# Patient Record
Sex: Female | Born: 1935 | Race: Black or African American | Hispanic: No | Smoking: Former smoker
Health system: Southern US, Community
[De-identification: ages and names within clinical notes are randomized; demographics above are authoritative.]

## PROBLEM LIST (undated history)

## (undated) DIAGNOSIS — I313 Pericardial effusion (noninflammatory): Secondary | ICD-10-CM

## (undated) DIAGNOSIS — T7840XA Allergy, unspecified, initial encounter: Secondary | ICD-10-CM

## (undated) DIAGNOSIS — C50919 Malignant neoplasm of unspecified site of unspecified female breast: Secondary | ICD-10-CM

## (undated) DIAGNOSIS — M8580 Other specified disorders of bone density and structure, unspecified site: Secondary | ICD-10-CM

## (undated) DIAGNOSIS — E785 Hyperlipidemia, unspecified: Secondary | ICD-10-CM

## (undated) DIAGNOSIS — F329 Major depressive disorder, single episode, unspecified: Secondary | ICD-10-CM

## (undated) DIAGNOSIS — F32A Depression, unspecified: Secondary | ICD-10-CM

## (undated) DIAGNOSIS — B070 Plantar wart: Secondary | ICD-10-CM

## (undated) DIAGNOSIS — S93409A Sprain of unspecified ligament of unspecified ankle, initial encounter: Secondary | ICD-10-CM

## (undated) DIAGNOSIS — I309 Acute pericarditis, unspecified: Secondary | ICD-10-CM

## (undated) DIAGNOSIS — I10 Essential (primary) hypertension: Secondary | ICD-10-CM

## (undated) DIAGNOSIS — I3139 Other pericardial effusion (noninflammatory): Secondary | ICD-10-CM

## (undated) DIAGNOSIS — B019 Varicella without complication: Secondary | ICD-10-CM

## (undated) DIAGNOSIS — F0390 Unspecified dementia without behavioral disturbance: Secondary | ICD-10-CM

## (undated) DIAGNOSIS — F419 Anxiety disorder, unspecified: Secondary | ICD-10-CM

## (undated) DIAGNOSIS — I319 Disease of pericardium, unspecified: Secondary | ICD-10-CM

## (undated) DIAGNOSIS — Z87891 Personal history of nicotine dependence: Secondary | ICD-10-CM

## (undated) DIAGNOSIS — F03A0 Unspecified dementia, mild, without behavioral disturbance, psychotic disturbance, mood disturbance, and anxiety: Secondary | ICD-10-CM

## (undated) HISTORY — DX: Unspecified dementia, mild, without behavioral disturbance, psychotic disturbance, mood disturbance, and anxiety: F03.A0

## (undated) HISTORY — DX: Other specified disorders of bone density and structure, unspecified site: M85.80

## (undated) HISTORY — DX: Anxiety disorder, unspecified: F41.9

## (undated) HISTORY — DX: Major depressive disorder, single episode, unspecified: F32.9

## (undated) HISTORY — DX: Sprain of unspecified ligament of unspecified ankle, initial encounter: S93.409A

## (undated) HISTORY — DX: Personal history of nicotine dependence: Z87.891

## (undated) HISTORY — DX: Hyperlipidemia, unspecified: E78.5

## (undated) HISTORY — DX: Allergy, unspecified, initial encounter: T78.40XA

## (undated) HISTORY — DX: Unspecified dementia without behavioral disturbance: F03.90

## (undated) HISTORY — PX: BREAST SURGERY: SHX581

## (undated) HISTORY — DX: Essential (primary) hypertension: I10

## (undated) HISTORY — PX: HAMMER TOE SURGERY: SHX385

## (undated) HISTORY — PX: BUNIONECTOMY: SHX129

## (undated) HISTORY — DX: Plantar wart: B07.0

## (undated) HISTORY — DX: Acute pericarditis, unspecified: I30.9

## (undated) HISTORY — DX: Depression, unspecified: F32.A

## (undated) HISTORY — DX: Varicella without complication: B01.9

## (undated) HISTORY — DX: Malignant neoplasm of unspecified site of unspecified female breast: C50.919

---

## 1998-10-27 ENCOUNTER — Encounter: Admission: RE | Admit: 1998-10-27 | Discharge: 1998-10-27 | Payer: Self-pay | Admitting: Internal Medicine

## 1999-02-04 ENCOUNTER — Encounter: Admission: RE | Admit: 1999-02-04 | Discharge: 1999-02-04 | Payer: Self-pay | Admitting: Internal Medicine

## 1999-04-14 ENCOUNTER — Encounter: Admission: RE | Admit: 1999-04-14 | Discharge: 1999-04-14 | Payer: Self-pay | Admitting: Internal Medicine

## 1999-04-14 ENCOUNTER — Ambulatory Visit (HOSPITAL_COMMUNITY): Admission: RE | Admit: 1999-04-14 | Discharge: 1999-04-14 | Payer: Self-pay | Admitting: Internal Medicine

## 1999-04-24 ENCOUNTER — Ambulatory Visit (HOSPITAL_COMMUNITY): Admission: RE | Admit: 1999-04-24 | Discharge: 1999-04-24 | Payer: Self-pay

## 1999-06-12 ENCOUNTER — Encounter: Admission: RE | Admit: 1999-06-12 | Discharge: 1999-06-12 | Payer: Self-pay | Admitting: Internal Medicine

## 1999-10-02 ENCOUNTER — Encounter: Admission: RE | Admit: 1999-10-02 | Discharge: 1999-10-02 | Payer: Self-pay | Admitting: Internal Medicine

## 1999-11-05 ENCOUNTER — Encounter: Admission: RE | Admit: 1999-11-05 | Discharge: 1999-11-05 | Payer: Self-pay | Admitting: Internal Medicine

## 2000-02-11 ENCOUNTER — Encounter: Admission: RE | Admit: 2000-02-11 | Discharge: 2000-02-11 | Payer: Self-pay | Admitting: Internal Medicine

## 2000-03-09 ENCOUNTER — Encounter: Admission: RE | Admit: 2000-03-09 | Discharge: 2000-03-09 | Payer: Self-pay | Admitting: Hematology and Oncology

## 2000-04-28 ENCOUNTER — Encounter: Admission: RE | Admit: 2000-04-28 | Discharge: 2000-04-28 | Payer: Self-pay | Admitting: Internal Medicine

## 2000-08-25 ENCOUNTER — Encounter: Admission: RE | Admit: 2000-08-25 | Discharge: 2000-08-25 | Payer: Self-pay | Admitting: Hematology and Oncology

## 2000-12-08 ENCOUNTER — Encounter: Admission: RE | Admit: 2000-12-08 | Discharge: 2000-12-08 | Payer: Self-pay | Admitting: Internal Medicine

## 2001-01-20 ENCOUNTER — Encounter: Admission: RE | Admit: 2001-01-20 | Discharge: 2001-01-20 | Payer: Self-pay | Admitting: Internal Medicine

## 2001-04-14 ENCOUNTER — Encounter: Admission: RE | Admit: 2001-04-14 | Discharge: 2001-04-14 | Payer: Self-pay

## 2001-06-08 ENCOUNTER — Encounter: Admission: RE | Admit: 2001-06-08 | Discharge: 2001-06-08 | Payer: Self-pay | Admitting: Internal Medicine

## 2001-06-11 ENCOUNTER — Emergency Department (HOSPITAL_COMMUNITY): Admission: EM | Admit: 2001-06-11 | Discharge: 2001-06-11 | Payer: Self-pay | Admitting: Emergency Medicine

## 2001-07-18 ENCOUNTER — Ambulatory Visit (HOSPITAL_COMMUNITY): Admission: RE | Admit: 2001-07-18 | Discharge: 2001-07-18 | Payer: Self-pay | Admitting: Internal Medicine

## 2001-09-11 ENCOUNTER — Encounter: Admission: RE | Admit: 2001-09-11 | Discharge: 2001-09-11 | Payer: Self-pay | Admitting: Internal Medicine

## 2001-09-12 ENCOUNTER — Encounter: Admission: RE | Admit: 2001-09-12 | Discharge: 2001-09-12 | Payer: Self-pay | Admitting: Internal Medicine

## 2001-10-20 ENCOUNTER — Encounter: Admission: RE | Admit: 2001-10-20 | Discharge: 2001-10-20 | Payer: Self-pay | Admitting: Internal Medicine

## 2001-12-04 ENCOUNTER — Encounter: Admission: RE | Admit: 2001-12-04 | Discharge: 2001-12-04 | Payer: Self-pay | Admitting: Internal Medicine

## 2002-01-15 ENCOUNTER — Encounter: Admission: RE | Admit: 2002-01-15 | Discharge: 2002-01-15 | Payer: Self-pay | Admitting: Internal Medicine

## 2002-07-09 ENCOUNTER — Encounter: Admission: RE | Admit: 2002-07-09 | Discharge: 2002-07-09 | Payer: Self-pay | Admitting: Internal Medicine

## 2002-10-10 ENCOUNTER — Encounter: Admission: RE | Admit: 2002-10-10 | Discharge: 2002-10-10 | Payer: Self-pay | Admitting: Internal Medicine

## 2003-03-04 ENCOUNTER — Encounter: Admission: RE | Admit: 2003-03-04 | Discharge: 2003-03-04 | Payer: Self-pay | Admitting: Internal Medicine

## 2003-03-04 ENCOUNTER — Emergency Department (HOSPITAL_COMMUNITY): Admission: EM | Admit: 2003-03-04 | Discharge: 2003-03-04 | Payer: Self-pay | Admitting: Emergency Medicine

## 2003-03-13 ENCOUNTER — Encounter: Admission: RE | Admit: 2003-03-13 | Discharge: 2003-03-13 | Payer: Self-pay | Admitting: Internal Medicine

## 2003-09-25 ENCOUNTER — Encounter: Admission: RE | Admit: 2003-09-25 | Discharge: 2003-09-25 | Payer: Self-pay | Admitting: Internal Medicine

## 2003-10-03 ENCOUNTER — Encounter: Admission: RE | Admit: 2003-10-03 | Discharge: 2003-10-03 | Payer: Self-pay | Admitting: Internal Medicine

## 2003-11-19 ENCOUNTER — Encounter: Admission: RE | Admit: 2003-11-19 | Discharge: 2003-11-19 | Payer: Self-pay | Admitting: Internal Medicine

## 2003-11-22 ENCOUNTER — Encounter: Admission: RE | Admit: 2003-11-22 | Discharge: 2003-11-22 | Payer: Self-pay | Admitting: Internal Medicine

## 2003-11-22 ENCOUNTER — Encounter (INDEPENDENT_AMBULATORY_CARE_PROVIDER_SITE_OTHER): Payer: Self-pay | Admitting: *Deleted

## 2003-11-27 DIAGNOSIS — C50919 Malignant neoplasm of unspecified site of unspecified female breast: Secondary | ICD-10-CM | POA: Insufficient documentation

## 2003-11-27 HISTORY — DX: Malignant neoplasm of unspecified site of unspecified female breast: C50.919

## 2003-12-02 ENCOUNTER — Encounter: Admission: RE | Admit: 2003-12-02 | Discharge: 2003-12-02 | Payer: Self-pay | Admitting: General Surgery

## 2003-12-04 ENCOUNTER — Ambulatory Visit (HOSPITAL_COMMUNITY): Admission: RE | Admit: 2003-12-04 | Discharge: 2003-12-04 | Payer: Self-pay | Admitting: General Surgery

## 2003-12-04 ENCOUNTER — Encounter (INDEPENDENT_AMBULATORY_CARE_PROVIDER_SITE_OTHER): Payer: Self-pay | Admitting: *Deleted

## 2003-12-04 ENCOUNTER — Ambulatory Visit (HOSPITAL_BASED_OUTPATIENT_CLINIC_OR_DEPARTMENT_OTHER): Admission: RE | Admit: 2003-12-04 | Discharge: 2003-12-04 | Payer: Self-pay | Admitting: General Surgery

## 2003-12-18 ENCOUNTER — Ambulatory Visit: Admission: RE | Admit: 2003-12-18 | Discharge: 2004-02-25 | Payer: Self-pay | Admitting: Radiation Oncology

## 2004-03-12 ENCOUNTER — Ambulatory Visit: Admission: RE | Admit: 2004-03-12 | Discharge: 2004-03-12 | Payer: Self-pay | Admitting: Radiation Oncology

## 2004-05-11 ENCOUNTER — Encounter: Admission: RE | Admit: 2004-05-11 | Discharge: 2004-05-11 | Payer: Self-pay | Admitting: Dentistry

## 2004-09-15 ENCOUNTER — Ambulatory Visit: Payer: Self-pay | Admitting: Internal Medicine

## 2004-09-17 ENCOUNTER — Ambulatory Visit: Payer: Self-pay | Admitting: Dentistry

## 2004-09-17 ENCOUNTER — Ambulatory Visit (HOSPITAL_COMMUNITY): Admission: RE | Admit: 2004-09-17 | Discharge: 2004-09-17 | Payer: Self-pay | Admitting: Dentistry

## 2004-09-17 ENCOUNTER — Encounter (INDEPENDENT_AMBULATORY_CARE_PROVIDER_SITE_OTHER): Payer: Self-pay | Admitting: Specialist

## 2004-09-24 ENCOUNTER — Ambulatory Visit: Payer: Self-pay | Admitting: Dentistry

## 2004-09-24 ENCOUNTER — Ambulatory Visit: Admission: RE | Admit: 2004-09-24 | Discharge: 2004-09-24 | Payer: Self-pay | Admitting: Radiation Oncology

## 2004-10-27 ENCOUNTER — Ambulatory Visit: Payer: Self-pay | Admitting: Dentistry

## 2004-11-01 ENCOUNTER — Ambulatory Visit: Payer: Self-pay | Admitting: Oncology

## 2004-11-11 ENCOUNTER — Ambulatory Visit: Payer: Self-pay | Admitting: Dentistry

## 2004-11-23 ENCOUNTER — Encounter: Admission: RE | Admit: 2004-11-23 | Discharge: 2004-11-23 | Payer: Self-pay | Admitting: General Surgery

## 2004-12-08 ENCOUNTER — Ambulatory Visit: Payer: Self-pay | Admitting: Dentistry

## 2004-12-30 ENCOUNTER — Ambulatory Visit: Payer: Self-pay | Admitting: Dentistry

## 2005-10-26 ENCOUNTER — Ambulatory Visit: Payer: Self-pay | Admitting: Oncology

## 2005-10-26 ENCOUNTER — Ambulatory Visit: Payer: Self-pay | Admitting: Internal Medicine

## 2005-11-29 ENCOUNTER — Encounter: Admission: RE | Admit: 2005-11-29 | Discharge: 2005-11-29 | Payer: Self-pay | Admitting: General Surgery

## 2006-01-14 ENCOUNTER — Ambulatory Visit: Payer: Self-pay | Admitting: Internal Medicine

## 2006-05-03 ENCOUNTER — Ambulatory Visit: Payer: Self-pay | Admitting: Internal Medicine

## 2006-08-02 ENCOUNTER — Ambulatory Visit: Payer: Self-pay | Admitting: Internal Medicine

## 2006-08-15 ENCOUNTER — Ambulatory Visit: Payer: Self-pay | Admitting: Hospitalist

## 2006-09-16 ENCOUNTER — Ambulatory Visit: Payer: Self-pay | Admitting: Hospitalist

## 2006-09-23 ENCOUNTER — Encounter: Admission: RE | Admit: 2006-09-23 | Discharge: 2006-09-23 | Payer: Self-pay | Admitting: General Surgery

## 2006-10-21 ENCOUNTER — Ambulatory Visit: Payer: Self-pay | Admitting: Oncology

## 2006-10-25 LAB — COMPREHENSIVE METABOLIC PANEL
ALT: 14 U/L (ref 0–40)
AST: 23 U/L (ref 0–37)
Alkaline Phosphatase: 134 U/L — ABNORMAL HIGH (ref 39–117)
Calcium: 9.1 mg/dL (ref 8.4–10.5)
Chloride: 108 mEq/L (ref 96–112)
Creatinine, Ser: 0.99 mg/dL (ref 0.40–1.20)
Potassium: 4.1 mEq/L (ref 3.5–5.3)

## 2006-10-25 LAB — CBC WITH DIFFERENTIAL/PLATELET
BASO%: 0.6 % (ref 0.0–2.0)
EOS%: 4.7 % (ref 0.0–7.0)
MCH: 33.8 pg (ref 26.0–34.0)
MCHC: 34.2 g/dL (ref 32.0–36.0)
NEUT%: 52.3 % (ref 39.6–76.8)
RDW: 12.8 % (ref 11.3–14.5)
lymph#: 1.6 10*3/uL (ref 0.9–3.3)

## 2006-11-08 DIAGNOSIS — Z87891 Personal history of nicotine dependence: Secondary | ICD-10-CM

## 2006-11-08 DIAGNOSIS — J310 Chronic rhinitis: Secondary | ICD-10-CM | POA: Insufficient documentation

## 2006-11-08 DIAGNOSIS — I10 Essential (primary) hypertension: Secondary | ICD-10-CM

## 2006-11-08 HISTORY — DX: Personal history of nicotine dependence: Z87.891

## 2007-01-11 DIAGNOSIS — E785 Hyperlipidemia, unspecified: Secondary | ICD-10-CM | POA: Insufficient documentation

## 2007-05-10 ENCOUNTER — Ambulatory Visit: Payer: Self-pay | Admitting: Internal Medicine

## 2007-05-10 DIAGNOSIS — S93409A Sprain of unspecified ligament of unspecified ankle, initial encounter: Secondary | ICD-10-CM

## 2007-05-10 HISTORY — DX: Sprain of unspecified ligament of unspecified ankle, initial encounter: S93.409A

## 2007-05-30 ENCOUNTER — Ambulatory Visit: Payer: Self-pay | Admitting: Internal Medicine

## 2007-08-08 ENCOUNTER — Telehealth: Payer: Self-pay | Admitting: Infectious Disease

## 2007-09-01 ENCOUNTER — Encounter (INDEPENDENT_AMBULATORY_CARE_PROVIDER_SITE_OTHER): Payer: Self-pay | Admitting: Internal Medicine

## 2007-09-01 ENCOUNTER — Ambulatory Visit (HOSPITAL_COMMUNITY): Admission: RE | Admit: 2007-09-01 | Discharge: 2007-09-01 | Payer: Self-pay | Admitting: Hospitalist

## 2007-09-01 ENCOUNTER — Ambulatory Visit: Payer: Self-pay | Admitting: Internal Medicine

## 2007-09-01 LAB — CONVERTED CEMR LAB
ALT: 13 units/L (ref 0–35)
AST: 20 units/L (ref 0–37)
Albumin: 4.1 g/dL (ref 3.5–5.2)
Alkaline Phosphatase: 140 units/L — ABNORMAL HIGH (ref 39–117)
BUN: 17 mg/dL (ref 6–23)
CO2: 24 meq/L (ref 19–32)
Calcium: 8.9 mg/dL (ref 8.4–10.5)
Chloride: 108 meq/L (ref 96–112)
Cholesterol: 169 mg/dL (ref 0–200)
Creatinine, Ser: 0.99 mg/dL (ref 0.40–1.20)
Glucose, Bld: 105 mg/dL — ABNORMAL HIGH (ref 70–99)
HCT: 41 % (ref 36.0–46.0)
HDL: 44 mg/dL (ref >39)
Hemoglobin: 13.6 g/dL (ref 12.0–15.0)
LDL Cholesterol: 111 mg/dL — ABNORMAL HIGH (ref 0–99)
MCHC: 33.2 g/dL (ref 30.0–36.0)
MCV: 99.8 fL (ref 78.0–100.0)
Platelets: 242 10*3/uL (ref 150–400)
Potassium: 4.5 meq/L (ref 3.5–5.3)
RBC: 4.11 M/uL (ref 3.87–5.11)
RDW: 12.6 % (ref 11.5–14.0)
Sodium: 143 meq/L (ref 135–145)
Total Bilirubin: 0.6 mg/dL (ref 0.3–1.2)
Total CHOL/HDL Ratio: 3.8
Total Protein: 6.9 g/dL (ref 6.0–8.3)
Triglycerides: 68 mg/dL (ref <150)
VLDL: 14 mg/dL (ref 0–40)
WBC: 5.8 10*3/uL (ref 4.0–10.5)

## 2007-09-12 ENCOUNTER — Telehealth (INDEPENDENT_AMBULATORY_CARE_PROVIDER_SITE_OTHER): Payer: Self-pay | Admitting: *Deleted

## 2007-10-19 ENCOUNTER — Encounter: Payer: Self-pay | Admitting: Internal Medicine

## 2007-10-19 ENCOUNTER — Encounter: Admission: RE | Admit: 2007-10-19 | Discharge: 2007-10-19 | Payer: Self-pay | Admitting: Surgery

## 2007-10-20 ENCOUNTER — Ambulatory Visit: Payer: Self-pay | Admitting: Oncology

## 2007-10-24 LAB — CBC WITH DIFFERENTIAL/PLATELET
BASO%: 0.6 % (ref 0.0–2.0)
Basophils Absolute: 0 10*3/uL (ref 0.0–0.1)
HCT: 38 % (ref 34.8–46.6)
LYMPH%: 28.9 % (ref 14.0–48.0)
MCHC: 34.7 g/dL (ref 32.0–36.0)
MONO#: 0.7 10*3/uL (ref 0.1–0.9)
NEUT%: 55.5 % (ref 39.6–76.8)
Platelets: 238 10*3/uL (ref 145–400)
WBC: 5.9 10*3/uL (ref 3.9–10.0)

## 2007-10-25 LAB — COMPREHENSIVE METABOLIC PANEL
AST: 20 U/L (ref 0–37)
BUN: 18 mg/dL (ref 6–23)
CO2: 25 mEq/L (ref 19–32)
Calcium: 9.3 mg/dL (ref 8.4–10.5)
Chloride: 109 mEq/L (ref 96–112)
Creatinine, Ser: 1.01 mg/dL (ref 0.40–1.20)
Glucose, Bld: 83 mg/dL (ref 70–99)

## 2007-10-31 ENCOUNTER — Encounter: Payer: Self-pay | Admitting: Internal Medicine

## 2007-11-08 ENCOUNTER — Encounter: Admission: RE | Admit: 2007-11-08 | Discharge: 2007-11-08 | Payer: Self-pay | Admitting: Oncology

## 2007-11-27 ENCOUNTER — Encounter: Payer: Self-pay | Admitting: Internal Medicine

## 2008-04-30 ENCOUNTER — Ambulatory Visit: Payer: Self-pay | Admitting: Internal Medicine

## 2008-09-06 ENCOUNTER — Ambulatory Visit: Payer: Self-pay | Admitting: Internal Medicine

## 2008-09-06 ENCOUNTER — Encounter (INDEPENDENT_AMBULATORY_CARE_PROVIDER_SITE_OTHER): Payer: Self-pay | Admitting: Internal Medicine

## 2008-09-06 ENCOUNTER — Encounter: Payer: Self-pay | Admitting: Internal Medicine

## 2008-09-10 ENCOUNTER — Telehealth (INDEPENDENT_AMBULATORY_CARE_PROVIDER_SITE_OTHER): Payer: Self-pay | Admitting: *Deleted

## 2008-09-17 LAB — CONVERTED CEMR LAB
ALT: 14 units/L (ref 0–35)
AST: 25 units/L (ref 0–37)
Albumin: 4.3 g/dL (ref 3.5–5.2)
Alkaline Phosphatase: 127 units/L — ABNORMAL HIGH (ref 39–117)
BUN: 16 mg/dL (ref 6–23)
CO2: 25 meq/L (ref 19–32)
Calcium: 9.4 mg/dL (ref 8.4–10.5)
Chloride: 107 meq/L (ref 96–112)
Cholesterol: 193 mg/dL (ref 0–200)
Creatinine, Ser: 1 mg/dL (ref 0.40–1.20)
Glucose, Bld: 105 mg/dL — ABNORMAL HIGH (ref 70–99)
HCT: 38.5 % (ref 36.0–46.0)
HDL: 47 mg/dL (ref >39)
Hemoglobin: 12.9 g/dL (ref 12.0–15.0)
LDL Cholesterol: 131 mg/dL — ABNORMAL HIGH (ref 0–99)
MCHC: 33.5 g/dL (ref 30.0–36.0)
MCV: 99.7 fL (ref 78.0–100.0)
Platelets: 225 10*3/uL (ref 150–400)
Potassium: 4.2 meq/L (ref 3.5–5.3)
RBC: 3.86 M/uL — ABNORMAL LOW (ref 3.87–5.11)
RDW: 12.6 % (ref 11.5–15.5)
Sodium: 142 meq/L (ref 135–145)
TSH: 3.042 microintl units/mL (ref 0.350–4.50)
Total Bilirubin: 0.4 mg/dL (ref 0.3–1.2)
Total CHOL/HDL Ratio: 4.1
Total Protein: 6.9 g/dL (ref 6.0–8.3)
Triglycerides: 73 mg/dL (ref <150)
VLDL: 15 mg/dL (ref 0–40)
Vit D, 1,25-Dihydroxy: 32 (ref 30–89)
WBC: 5.6 10*3/uL (ref 4.0–10.5)

## 2008-09-24 ENCOUNTER — Telehealth: Payer: Self-pay | Admitting: Internal Medicine

## 2008-10-28 ENCOUNTER — Ambulatory Visit: Payer: Self-pay | Admitting: Oncology

## 2008-10-31 ENCOUNTER — Telehealth: Payer: Self-pay | Admitting: Internal Medicine

## 2008-11-06 ENCOUNTER — Encounter: Admission: RE | Admit: 2008-11-06 | Discharge: 2008-11-06 | Payer: Self-pay | Admitting: Oncology

## 2008-11-12 ENCOUNTER — Encounter: Payer: Self-pay | Admitting: Internal Medicine

## 2008-11-12 LAB — CBC WITH DIFFERENTIAL/PLATELET
BASO%: 0.6 % (ref 0.0–2.0)
Eosinophils Absolute: 0.2 10*3/uL (ref 0.0–0.5)
HCT: 40 % (ref 34.8–46.6)
LYMPH%: 27.2 % (ref 14.0–48.0)
MCHC: 33.9 g/dL (ref 32.0–36.0)
MONO#: 0.7 10*3/uL (ref 0.1–0.9)
NEUT#: 3.1 10*3/uL (ref 1.5–6.5)
Platelets: 186 10*3/uL (ref 145–400)
RBC: 4.02 10*6/uL (ref 3.70–5.32)
WBC: 5.5 10*3/uL (ref 3.9–10.0)
lymph#: 1.5 10*3/uL (ref 0.9–3.3)

## 2008-11-12 LAB — COMPREHENSIVE METABOLIC PANEL
ALT: 14 U/L (ref 0–35)
AST: 22 U/L (ref 0–37)
Albumin: 4.4 g/dL (ref 3.5–5.2)
Calcium: 9.6 mg/dL (ref 8.4–10.5)
Chloride: 106 mEq/L (ref 96–112)
Potassium: 3.8 mEq/L (ref 3.5–5.3)

## 2008-11-12 LAB — CANCER ANTIGEN 27.29: CA 27.29: 23 U/mL (ref 0–39)

## 2009-02-21 ENCOUNTER — Ambulatory Visit: Payer: Self-pay | Admitting: Oncology

## 2009-02-25 LAB — COMPREHENSIVE METABOLIC PANEL
CO2: 29 mEq/L (ref 19–32)
Calcium: 8.8 mg/dL (ref 8.4–10.5)
Chloride: 108 mEq/L (ref 96–112)
Creatinine, Ser: 0.93 mg/dL (ref 0.40–1.20)
Glucose, Bld: 98 mg/dL (ref 70–99)
Total Bilirubin: 0.7 mg/dL (ref 0.3–1.2)

## 2009-02-25 LAB — CBC WITH DIFFERENTIAL/PLATELET
Basophils Absolute: 0 10*3/uL (ref 0.0–0.1)
Eosinophils Absolute: 0.2 10*3/uL (ref 0.0–0.5)
HCT: 38 % (ref 34.8–46.6)
HGB: 12.7 g/dL (ref 11.6–15.9)
LYMPH%: 28.3 % (ref 14.0–49.7)
MONO#: 0.7 10*3/uL (ref 0.1–0.9)
NEUT#: 2.9 10*3/uL (ref 1.5–6.5)
NEUT%: 55.1 % (ref 38.4–76.8)
Platelets: 197 10*3/uL (ref 145–400)
WBC: 5.2 10*3/uL (ref 3.9–10.3)
lymph#: 1.5 10*3/uL (ref 0.9–3.3)

## 2009-03-25 ENCOUNTER — Ambulatory Visit: Payer: Self-pay | Admitting: Internal Medicine

## 2009-06-02 ENCOUNTER — Ambulatory Visit: Payer: Self-pay | Admitting: Oncology

## 2009-06-04 ENCOUNTER — Encounter: Payer: Self-pay | Admitting: Internal Medicine

## 2009-09-29 ENCOUNTER — Telehealth: Payer: Self-pay | Admitting: Internal Medicine

## 2009-10-20 ENCOUNTER — Ambulatory Visit: Payer: Self-pay | Admitting: Internal Medicine

## 2009-10-21 ENCOUNTER — Encounter: Payer: Self-pay | Admitting: Internal Medicine

## 2009-10-22 LAB — CONVERTED CEMR LAB
ALT: 11 units/L (ref 0–35)
AST: 21 units/L (ref 0–37)
Albumin: 4.2 g/dL (ref 3.5–5.2)
Alkaline Phosphatase: 114 units/L (ref 39–117)
BUN: 18 mg/dL (ref 6–23)
CO2: 27 meq/L (ref 19–32)
Calcium: 9.7 mg/dL (ref 8.4–10.5)
Chloride: 105 meq/L (ref 96–112)
Cholesterol: 127 mg/dL (ref 0–200)
Creatinine, Ser: 1.04 mg/dL (ref 0.40–1.20)
Glucose, Bld: 104 mg/dL — ABNORMAL HIGH (ref 70–99)
HDL: 45 mg/dL (ref >39)
LDL Cholesterol: 68 mg/dL (ref 0–99)
Potassium: 4.1 meq/L (ref 3.5–5.3)
Sodium: 144 meq/L (ref 135–145)
Total Bilirubin: 0.3 mg/dL (ref 0.3–1.2)
Total CHOL/HDL Ratio: 2.8
Total Protein: 6.8 g/dL (ref 6.0–8.3)
Triglycerides: 71 mg/dL (ref <150)
VLDL: 14 mg/dL (ref 0–40)

## 2010-02-23 ENCOUNTER — Ambulatory Visit: Payer: Self-pay | Admitting: Internal Medicine

## 2010-03-03 ENCOUNTER — Ambulatory Visit: Payer: Self-pay | Admitting: Oncology

## 2010-04-09 ENCOUNTER — Encounter: Payer: Self-pay | Admitting: Internal Medicine

## 2010-04-09 ENCOUNTER — Encounter: Admission: RE | Admit: 2010-04-09 | Discharge: 2010-04-09 | Payer: Self-pay | Admitting: Oncology

## 2010-04-10 ENCOUNTER — Ambulatory Visit: Payer: Self-pay | Admitting: Oncology

## 2010-04-14 LAB — COMPREHENSIVE METABOLIC PANEL
ALT: 16 U/L (ref 0–35)
AST: 24 U/L (ref 0–37)
Albumin: 4.2 g/dL (ref 3.5–5.2)
Alkaline Phosphatase: 99 U/L (ref 39–117)
BUN: 19 mg/dL (ref 6–23)
CO2: 24 mEq/L (ref 19–32)
Calcium: 8.9 mg/dL (ref 8.4–10.5)
Chloride: 107 mEq/L (ref 96–112)
Creatinine, Ser: 0.97 mg/dL (ref 0.40–1.20)
Glucose, Bld: 84 mg/dL (ref 70–99)
Potassium: 4.3 mEq/L (ref 3.5–5.3)
Sodium: 141 mEq/L (ref 135–145)
Total Bilirubin: 0.5 mg/dL (ref 0.3–1.2)
Total Protein: 6.8 g/dL (ref 6.0–8.3)

## 2010-04-14 LAB — CBC WITH DIFFERENTIAL/PLATELET
BASO%: 0.6 % (ref 0.0–2.0)
Basophils Absolute: 0 10*3/uL (ref 0.0–0.1)
EOS%: 3.2 % (ref 0.0–7.0)
Eosinophils Absolute: 0.2 10*3/uL (ref 0.0–0.5)
HCT: 37.2 % (ref 34.8–46.6)
HGB: 12.7 g/dL (ref 11.6–15.9)
LYMPH%: 28.6 % (ref 14.0–49.7)
MCH: 34.3 pg — ABNORMAL HIGH (ref 25.1–34.0)
MCHC: 34.1 g/dL (ref 31.5–36.0)
MCV: 100.8 fL (ref 79.5–101.0)
MONO#: 0.7 10*3/uL (ref 0.1–0.9)
MONO%: 13.6 % (ref 0.0–14.0)
NEUT#: 2.7 10*3/uL (ref 1.5–6.5)
NEUT%: 54 % (ref 38.4–76.8)
Platelets: 191 10*3/uL (ref 145–400)
RBC: 3.69 10*6/uL — ABNORMAL LOW (ref 3.70–5.45)
RDW: 12.6 % (ref 11.2–14.5)
WBC: 4.9 10*3/uL (ref 3.9–10.3)
lymph#: 1.4 10*3/uL (ref 0.9–3.3)

## 2010-04-21 ENCOUNTER — Encounter: Payer: Self-pay | Admitting: Internal Medicine

## 2010-04-27 ENCOUNTER — Encounter: Admission: RE | Admit: 2010-04-27 | Discharge: 2010-04-27 | Payer: Self-pay | Admitting: Oncology

## 2010-08-26 ENCOUNTER — Ambulatory Visit: Payer: Self-pay | Admitting: Internal Medicine

## 2010-10-07 ENCOUNTER — Encounter: Payer: Self-pay | Admitting: Internal Medicine

## 2010-10-07 ENCOUNTER — Ambulatory Visit: Payer: Self-pay | Admitting: Internal Medicine

## 2010-10-07 LAB — CONVERTED CEMR LAB
ALT: 17 U/L
AST: 26 U/L
Albumin: 4.1 g/dL
Alkaline Phosphatase: 103 U/L
BUN: 16 mg/dL
CO2: 28 meq/L
Calcium: 9 mg/dL
Chloride: 108 meq/L
Cholesterol: 152 mg/dL
Creatinine, Ser: 0.84 mg/dL
Glucose, Bld: 95 mg/dL
HDL: 55 mg/dL
LDL Cholesterol: 86 mg/dL
Potassium: 4 meq/L
Sodium: 143 meq/L
TSH: 2.076 u[IU]/mL
Total Bilirubin: 0.6 mg/dL
Total CHOL/HDL Ratio: 2.8
Total Protein: 6.9 g/dL
Triglycerides: 53 mg/dL
VLDL: 11 mg/dL

## 2011-01-16 ENCOUNTER — Encounter: Payer: Self-pay | Admitting: General Surgery

## 2011-01-17 ENCOUNTER — Encounter: Payer: Self-pay | Admitting: Oncology

## 2011-01-28 NOTE — Assessment & Plan Note (Signed)
Summary: EST-6 MONTH ROUTINE CHECKUP/CH   Vital Signs:  Patient profile:   77 year old female Height:      66 inches (167.64 cm) Weight:      130.6 pounds (59.36 kg) BMI:     21.16 Temp:     98.3 degrees F oral Pulse rate:   62 / minute BP sitting:   142 / 84  (right arm)  Vitals Entered By: Chinita Pester RN (August 26, 2010 10:12 AM) CC: Check-up. Is Patient Diabetic? No Pain Assessment Patient in pain? no      Nutritional Status BMI of 19 -24 = normal  Have you ever been in a relationship where you felt threatened, hurt or afraid?No   Does patient need assistance? Functional Status Self care Ambulation Normal   Primary Care Provider:  Mariea Stable MD  CC:  Check-up.Marland Kitchen  History of Present Illness: Patient is here "for a up." Takes all her meds as instructed. Denies any side-effects. C/o "runny nose." Hx of rhinitis and nose bleeds. Zyrtec does  not provide a full relief. c/o ofdry cough anditchy eyes.Denies fever, chills,purulent discharge from eyes or nose; tooth ache or HA.  Depression History:      The patient denies a depressed mood most of the day and a diminished interest in her usual daily activities.         Preventive Screening-Counseling & Management  Alcohol-Tobacco     Alcohol drinks/day: 0     Smoking Status: quit     Year Quit: 19 YEARS AGO  Caffeine-Diet-Exercise     Does Patient Exercise: no  Current Problems (verified): 1)  Health Maintenance Exam  (ICD-V70.0) 2)  Ankle Sprain, Right  (ICD-845.00) 3)  Hx of Carcinoma, Infiltrating Ductal, Right Breast  (ICD-174.9) 4)  Hyperlipidemia Nec/nos  (ICD-272.4) 5)  Rhinitis, Chronic  (ICD-472.0) 6)  Tobacco Abuse, Hx of  (ICD-V15.82) 7)  Hypertension  (ICD-401.9)  Medications Prior to Update: 1)  Atenolol 50 Mg Tabs (Atenolol) .... Take One Tablet By Mouth Once Daily 2)  Norvasc 5 Mg Tabs (Amlodipine Besylate) .... Take One Tablet By Mouth Once Daily 3)  Cetirizine Hcl 10 Mg  Tabs (Cetirizine Hcl) .... Take 1 Tablet By Mouth Once A Day 4)  Pravachol 40 Mg Tabs (Pravastatin Sodium) .... Take 1 Tab By Mouth At Bedtime  Allergies (verified): 1)  ! * Bee Stings  Past History:  Risk Factors: Smoking Status: quit (08/26/2010)  Past medical, surgical, family and social histories (including risk factors) reviewed for relevance to current acute and chronic problems.  Past Medical History: Reviewed history from 03/25/2009 and no changes required. 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 R partial mastectomy and lymph node bx. Bunionectomy R foot Hammertoe #2 on Right foot  Family History: Reviewed history and no changes required.  Social History: Reviewed history from 05/10/2007 and no changes required. Former Smoker Does Patient Exercise:  no  Review of Systems  The patient denies anorexia, fever, weight loss, weight gain, vision loss, decreased hearing, hoarseness, chest pain, syncope, dyspnea on exertion, peripheral edema, prolonged cough, headaches, hemoptysis, abdominal pain, melena, hematochezia, severe indigestion/heartburn, hematuria, incontinence, genital sores, muscle weakness, suspicious skin lesions, transient blindness, difficulty walking, depression, unusual weight change, abnormal bleeding, enlarged lymph nodes, angioedema,  and breast masses.    Physical Exam  General:  alert and well-developed.  cooperative to examination.   Head:  normocephalic and atraumatic.   Eyes:  vision grossly intact and no injection.   Ears:  R ear normal and L ear normal.   Nose:  no external deformity, no airflow obstruction, no nasal polyps, no nasal mucosal lesions, no mucosal friability, no active bleeding or clots, no sinus percussion  tenderness, nasal dischargemucosal pallor, and mucosal edema.   Mouth:  good dentition, no gingival abnormalities, and pharynx pink and moist.  ost nasal drip noted. Neck:  supple and no masses.   Lungs:  normal respiratory effort and normal breath sounds.   Heart:  normal rate, regular rhythm, no murmur, and no JVD.   Abdomen:  normal bowel sounds.   Msk:  normal ROM and no joint swelling.   Pulses:  good palpable DP/PT pulses bilaterally Extremities:  no edema Neurologic:  alert & oriented X3.   Skin:  Intact without suspicious lesions or rashes Cervical Nodes:  No lymphadenopathy noted Psych:  Cognition and judgment appear intact. Alert and cooperative with normal attention span and concentration. No apparent delusions, illusions, hallucinations   Impression & Recommendations:  Problem # 1:  RHINITIS, CHRONIC (ICD-472.0) Assessment Unchanged Vasomotor? Will add Flonase to her regimen with Zyrtec.  Problem # 2:  HYPERTENSION (ICD-401.9) Assessment: Unchanged  Wiil continue with current regimen. Low salt diet and exercise  Patient had her annual fundoscopic exam 2 months ago, per patient's report. Her updated medication list for this problem includes:    Atenolol 50 Mg Tabs (Atenolol) .Marland Kitchen... Take one tablet by mouth once daily    Norvasc 5 Mg Tabs (Amlodipine besylate) .Marland Kitchen... Take one tablet by mouth once daily  Orders: T-Urine Microalbumin w/creat. ratio [id number]) T-CMP with Estimated GFR ([id number])  BP today: 142/84 Prior BP: 126/78 (02/23/2010)  Labs Reviewed: K+: 4.1 (10/21/2009) Creat: : 1.04 (10/21/2009)   Chol: 127 (10/21/2009)   HDL: 45 (10/21/2009)   LDL: 68 (10/21/2009)   TG: 71 (10/21/2009)  Problem # 3:  HEALTH MAINTENANCE EXAM (ICD-V70.0) Assessment: Comment Only Patient refuses Td, Pneumovax; Bone Dexa and Colonsocopy. diet, exercise, seat belts addressed during this visit and all questions answere infull. Orders: T-CBC No Diff  ([id number])  Complete Medication List: 1)  Atenolol 50 Mg Tabs (Atenolol) .... Take one tablet by mouth once daily 2)  Norvasc 5 Mg Tabs (Amlodipine besylate) .... Take one tablet by mouth once daily 3)  Cetirizine Hcl 10 Mg Tabs (Cetirizine hcl) .... Take 1 tablet by mouth once a day 4)  Pravachol 40 Mg Tabs (Pravastatin sodium) .... Take 1 tab by mouth at bedtime 5)  Fluticasone Propionate 50 Mcg/act Susp (Fluticasone propionate) .... One spray each nostril once a day  Other Orders: T-Lipid Profile ([id number]) T-TSH [id number]) Influenza Vaccine MCR [phone])  Patient Instructions: 1)  Please,pick up your new prescription for allergiesat your pharmacy. 2)  please, returnin October (on an empty stomach) for your blood work. 3)  Please, call with any questions. 4)  Follow with Dr. Onalee Hua on as neededbasis. Prescriptions: FLUTICASONE PROPIONATE 50 MCG/ACT SUSP (FLUTICASONE PROPIONATE) One spray each nostril once a day  #1 x 6   Entered and Authorized by:   Deatra Robinson MD   Signed by:   Deatra Robinson MD on 08/26/2010   Method used:   Electronically to        [facility] [id number]* (retail)  [address]       [place], [place]  [address]       Ph: [phone]       Fax: [phone]   RxID:   [id number] CETIRIZINE HCL 10 MG TABS (CETIRIZINE HCL) Take 1 tablet by mouth once a day  #30 Each x 6   Entered and Authorized by:   Deatra Robinson MD   Signed by:   Deatra Robinson MD on 08/26/2010   Method used:   Electronically to        [facility] [address] #[id number]* (retail)       [address] [place], [place]  [address]       Ph: [phone]       Fax: [phone]   RxID:   [id number]  Process Orders Check Orders Results:     [facility]: Check successful Tests Sent for requisitioning (August 26, 2010 12:06 PM):     08/26/2010: [facility] -- T-Urine Microalbumin w/creat. ratio [[id number]] (signed)      08/26/2010: [facility] -- T-CMP with Estimated GFR [[id number]] (signed)     08/26/2010: [facility] -- T-Lipid Profile [id number] (signed)     08/26/2010: [facility] -- T-TSH [phone] (signed)     08/26/2010: [facility] -- T-CBC No Diff [[id number]] (signed)     Prevention & Chronic Care Immunizations   Influenza vaccine: Fluvax MCR  (08/26/2010)    Tetanus booster: Not documented   Td booster deferral: Refused  (02/23/2010)    Pneumococcal vaccine: Not documented   Pneumococcal vaccine deferral: Refused  (02/23/2010)    H. zoster vaccine: Not documented   H. zoster vaccine deferral: Deferred  (02/23/2010)  Colorectal Screening   Hemoccult: Negative  (08/07/2006)   Hemoccult action/deferral: Ordered  (10/20/2009)    Colonoscopy: Not documented   Colonoscopy action/deferral: Refused  (10/20/2009)  Other Screening   Pap smear: Not documented   Pap smear action/deferral: Not indicated-other  (02/23/2010)    Mammogram: Not documented   Mammogram action/deferral: Ordered  (02/23/2010)   Mammogram due: 01/27/2011    DXA bone density scan: Not documented   DXA bone density action/deferral: Refused  (08/26/2010)  Reports requested:   Last mammogram report requested.   Last DXA report requested.  Smoking status: quit  (08/26/2010)  Lipids   Total Cholesterol: 127  (10/21/2009)   Lipid panel action/deferral: Lipid Panel ordered   LDL: 68  (10/21/2009)   LDL Direct: Not documented   HDL: 45  (10/21/2009)   Triglycerides: 71  (10/21/2009)    SGOT (AST): 21  (10/21/2009)   BMP action: Ordered   SGPT (ALT): 11  (10/21/2009)   Alkaline phosphatase: 114  (10/21/2009)   Total bilirubin: 0.3  (10/21/2009)    Lipid flowsheet reviewed?: Yes   Progress toward LDL goal: At goal  Hypertension   Last Blood Pressure: 142 / 84  (08/26/2010)   Serum creatinine: 1.04  (10/21/2009)   Serum potassium 4.1   (10/21/2009)    Hypertension flowsheet reviewed?: Yes   Progress toward BP goal: Deteriorated  Self-Management Support :   Personal Goals (by the next clinic visit) :      Personal blood pressure goal: 130/80  (02/23/2010)     Personal LDL goal: 100  (02/23/2010)    Patient will work on the following items until the next clinic visit to reach self-care goals:     Medications and monitoring: take my medicines every  day, bring all of my medications to every visit  (08/26/2010)     Eating: eat foods that are low in salt, eat baked foods instead of fried foods  (08/26/2010)     Activity: take a 30 minute walk every day  (08/26/2010)    Hypertension self-management support: Written self-care plan  (08/26/2010)   Hypertension self-care plan printed.    Lipid self-management support: Written self-care plan  (08/26/2010)   Lipid self-care plan printed.   Nursing Instructions: Give Flu vaccine today Request report of last mammogram Request report of last DXA scan    Influenza Vaccine    Vaccine Type: Fluvax MCR    Site: right deltoid    Mfr: GlaxoSmithKline    Dose: 0.5 ml    Route: IM    Given by: Chinita Pester RN    Exp. Date: 06/26/2011    Lot #: [id number]    VIS given: 07/20/07 version given August 26, 2010.  Flu Vaccine Consent Questions    Do you have a history of severe allergic reactions to this vaccine? no    Any prior history of allergic reactions to egg and/or gelatin? no    Do you have a sensitivity to the preservative Thimersol? no    Do you have a past history of Guillan-Barre Syndrome? no    Do you currently have an acute febrile illness? no    Have you ever had a severe reaction to latex? no    Vaccine information given and explained to patient? yes    Are you currently pregnant? no

## 2011-01-28 NOTE — Letter (Signed)
Summary: [facility]  [facility]   Imported By: Margie Billet 05/12/2010 14:04:26  _____________________________________________________________________  External Attachment:    Type:   Image     Comment:   External Document  Appended Document: [facility]    Clinical Lists Changes  Medications: Removed medication of AROMASIN 25 MG TABS (EXEMESTANE) Take 1 tablet by mouth once a day       Problem # 13:  Hx of CARCINOMA, INFILTRATING DUCTAL, RIGHT BREAST (ICD-174.9) Doing well without evidence of recurrence Aromasin now stopped by Dr. Darnelle Catalan after further discussion with pt.   Complete Medication List: 1)  Atenolol 50 Mg Tabs (Atenolol) .... Take one tablet by mouth once daily 2)  Norvasc 5 Mg Tabs (Amlodipine besylate) .... Take one tablet by mouth once daily 3)  Cetirizine Hcl 10 Mg Tabs (Cetirizine hcl) .... Take 1 tablet by mouth once a day 4)  Pravachol 40 Mg Tabs (Pravastatin sodium) .... Take 1 tab by mouth at bedtime

## 2011-01-28 NOTE — Assessment & Plan Note (Signed)
Summary: routine f/u//kg   Vital Signs:  Patient profile:   77 year old female Height:      66 inches (167.64 cm) Weight:      138.3 pounds (62.86 kg) BMI:     22.40 Temp:     98.2 degrees F (36.78 degrees C) oral Pulse rate:   63 / minute BP sitting:   126 / 78  (right arm) Cuff size:   regular  Vitals Entered By: Krystal Eaton Duncan Dull) (February 23, 2010 3:55 PM) CC: routine f/u, med refill on all meds Is Patient Diabetic? No Pain Assessment Patient in pain? no      Nutritional Status BMI of 19 -24 = normal  Have you ever been in a relationship where you felt threatened, hurt or afraid?No   Does patient need assistance? Functional Status Self care Ambulation Normal   Primary Care Provider:  Mariea Stable MD  CC:  routine f/u and med refill on all meds.  History of Present Illness: Pamela Choi is a 77 yo woman with PMH as outlined below.  She is doing well without complaints.  She states she is to see Dr. Darnelle Catalan in March.    Preventive Screening-Counseling & Management  Alcohol-Tobacco     Smoking Status: quit     Year Quit: 19 YEARS AGO  Current Medications (verified): 1)  Atenolol 50 Mg Tabs (Atenolol) .... Take One Tablet By Mouth Once Daily 2)  Norvasc 5 Mg Tabs (Amlodipine Besylate) .... Take One Tablet By Mouth Once Daily 3)  Cetirizine Hcl 10 Mg Tabs (Cetirizine Hcl) .... Take 1 Tablet By Mouth Once A Day 4)  Pravachol 40 Mg Tabs (Pravastatin Sodium) .... Take 1 Tab By Mouth At Bedtime 5)  Aromasin 25 Mg Tabs (Exemestane) .... Take 1 Tablet By Mouth Once A Day  Allergies (verified): 1)  ! 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 Bunionectomy R foot Hammertoe #2 on Right foot  Social History: Last updated: 05/10/2007 Former Smoker  Risk Factors: Smoking Status: quit (02/23/2010)  Review of Systems      See HPI  Physical Exam  General:  alert and well-developed.  cooperative to examination.   Eyes:  vision grossly intact, pupils equal, pupils round, and pupils reactive to light.  anicteric Neck:  supple, no masses, no thyromegaly, no JVD, and no carotid bruits.  There is a small mobile LN left cervical chain Lungs:  normal respiratory effort, no accessory muscle use, normal breath sounds, no crackles, and no wheezes.   Heart:  normal rate, regular rhythm, no murmur, and no JVD.   Abdomen:  normal bowel sounds.   Extremities:  no edema Neurologic:  alert & oriented X3, cranial nerves II-XII intact, strength normal in all extremities, and gait normal.   Cervical Nodes:  small mobile anterior node on L Psych:  Oriented X3, memory intact for recent and remote, normally interactive, and good eye contact.     Impression & Recommendations:  Problem # 1:  HYPERTENSION (ICD-401.9) At goal No change Check CMET next visit  Her updated medication list for this problem includes:    Atenolol 50 Mg Tabs (Atenolol) .Marland Kitchen... Take one tablet by  mouth once daily    Norvasc 5 Mg Tabs (Amlodipine besylate) .Marland Kitchen... Take one tablet by mouth once daily  BP today: 126/78 Prior BP: 130/78 (10/20/2009)  Labs Reviewed: K+: 4.1 (10/21/2009) Creat: : 1.04 (10/21/2009)   Chol: 127 (10/21/2009)   HDL: 45 (10/21/2009)   LDL: 68 (10/21/2009)   TG: 71 (10/21/2009)  Problem # 2:  HYPERLIPIDEMIA NEC/NOS (ICD-272.4) At goal Check FLP and LFTs next visit  Her updated medication list for this problem includes:    Pravachol 40 Mg Tabs (Pravastatin sodium) .Marland Kitchen... Take 1 tab by mouth at bedtime  Labs Reviewed: SGOT: 21 (10/21/2009)   SGPT: 11 (10/21/2009)   HDL:45 (10/21/2009), 47 (09/06/2008)  LDL:68 (10/21/2009), 131 (16/09/9603)   Chol:127 (10/21/2009), 193 (09/06/2008)  Trig:71 (10/21/2009), 73 (09/06/2008)  Problem # 3:  Hx of CARCINOMA, INFILTRATING DUCTAL, RIGHT BREAST (ICD-174.9) To follow up with Dr. Darnelle Catalan Will make aware of cervical node, do not think this is pathologic Aromasin per Onc  Problem # 4:  HEALTH MAINTENANCE EXAM (ICD-V70.0) Mammo and Dexa ordered today Will try to obtain prior colonoscopy Do not think pt needs further Pap smears given 77 and no prior abnormals Refused vaccinations  Complete Medication List: 1)  Atenolol 50 Mg Tabs (Atenolol) .... Take one tablet by mouth once daily 2)  Norvasc 5 Mg Tabs (Amlodipine besylate) .... Take one tablet by mouth once daily 3)  Cetirizine Hcl 10 Mg Tabs (Cetirizine hcl) .... Take 1 tablet by mouth once a day 4)  Pravachol 40 Mg Tabs (Pravastatin sodium) .... Take 1 tab by mouth at bedtime 5)  Aromasin 25 Mg Tabs (Exemestane) .... Take 1 tablet by mouth once a day  Other Orders: Dexa scan (Dexa scan) Mammogram (Screening) (Mammo)  Patient Instructions: 1)  Please schedule a follow-up appointment in 6 months. 2)  Make sure to follow up with Dr. Darnelle Catalan 3)  Will order mammogram and bone density scans. 4)  You are otherwise doing great. 5)  If you have any problems, call clinic to be seen sooner. 6)  Will check blood work during next visit.    Prevention & Chronic Care Immunizations   Influenza vaccine: Fluvax MCR  (10/20/2009)    Tetanus booster: Not documented   Td booster deferral: Refused  (02/23/2010)    Pneumococcal vaccine: Not documented   Pneumococcal vaccine deferral: Refused  (02/23/2010)    H. zoster vaccine: Not documented   H. zoster vaccine deferral: Deferred  (02/23/2010)  Colorectal Screening   Hemoccult: Negative  (08/07/2006)   Hemoccult action/deferral: Ordered  (10/20/2009)    Colonoscopy: Not documented   Colonoscopy action/deferral: Refused  (10/20/2009)  Other Screening   Pap smear: Not documented   Pap  smear action/deferral: Not indicated-other  (02/23/2010)    Mammogram: Not documented   Mammogram action/deferral: Ordered  (02/23/2010)   Mammogram due: 10/2008    DXA bone density scan: Not documented   DXA bone density action/deferral: Ordered  (02/23/2010)  Reports requested:   Last colonoscopy report requested.  Smoking status: quit  (02/23/2010)  Lipids   Total Cholesterol: 127  (10/21/2009)   Lipid panel action/deferral: Lipid Panel ordered   LDL: 68  (10/21/2009)   LDL Direct: Not documented   HDL: 45  (10/21/2009)   Triglycerides: 71  (10/21/2009)    SGOT (AST): 21  (10/21/2009)   BMP action: Ordered   SGPT (ALT): 11  (10/21/2009)   Alkaline phosphatase: 114  (10/21/2009)   Total bilirubin: 0.3  (10/21/2009)    Lipid  flowsheet reviewed?: Yes   Progress toward LDL goal: At goal  Hypertension   Last Blood Pressure: 126 / 78  (02/23/2010)   Serum creatinine: 1.04  (10/21/2009)   Serum potassium 4.1  (10/21/2009)    Hypertension flowsheet reviewed?: Yes   Progress toward BP goal: At goal  Self-Management Support :   Personal Goals (by the next clinic visit) :      Personal blood pressure goal: 130/80  (02/23/2010)     Personal LDL goal: 100  (02/23/2010)    Patient will work on the following items until the next clinic visit to reach self-care goals:     Medications and monitoring: take my medicines every day  (02/23/2010)     Eating: eat foods that are low in salt, eat baked foods instead of fried foods  (02/23/2010)    Hypertension self-management support: Pre-printed educational material, Written self-care plan, Resources for patients handout  (02/23/2010)   Hypertension self-care plan printed.    Lipid self-management support: Pre-printed educational material, Written self-care plan, Resources for patients handout  (02/23/2010)   Lipid self-care plan printed.      Resource handout printed.   Nursing Instructions: Schedule screening DXA bone density  scan (see order) Schedule screening mammogram (see order) Request report of last colonoscopy    Appended Document: hemoccult results    Lab Visit  Laboratory Results  Date/Time Received: February 23, 2010 4:38 PM Date/Time Reported: Alric Quan  February 23, 2010 4:38 PM  Stool - Occult Blood Hemmoccult #1: negative Date: 02/20/2010 Hemoccult #2: negative Date: 02/21/2010 Hemoccult #3: negative Date: 02/22/2010  Kit Test Internal QC: Positive   (Normal Range: Negative) Orders Today:   Appended Document: routine f/u//kg Dexa scan and Mammogram reports are in EMR.  Appended Document: routine f/u//kg Scans are in Echart not EMR.

## 2011-01-28 NOTE — Assessment & Plan Note (Signed)
Summary: EST-2 MONTH F/U VISIT/CH   Vital Signs:  Patient profile:   77 year old female Height:      66 inches (167.64 cm) Weight:      129.4 pounds (59.36 kg) BMI:     21.16 Temp:     98.1 degrees F (36.72 degrees C) oral Pulse rate:   64 / minute BP sitting:   138 / 83  (left arm) Cuff size:   regular  Vitals Entered By: Theotis Barrio NT II (October 07, 2010 10:16 AM) CC:  RUNNY NOSE WITH SOME COUGHING / ALREADY HAD FLY SHOT / 2 MONTH FOLLOW UP APPT Is Patient Diabetic? No Pain Assessment Patient in pain? no      Nutritional Status BMI of 19 -24 = normal  Have you ever been in a relationship where you felt threatened, hurt or afraid?No   Does patient need assistance? Functional Status Self care Ambulation Normal Comments FRUNNY NOSE WITH SOME COUGHING  /  2 MONTH FOLLOW UP APPT   Primary Care Provider:  Mariea Stable MD  CC:   RUNNY NOSE WITH SOME COUGHING / ALREADY HAD FLY SHOT / 2 MONTH FOLLOW UP APPT.  History of Present Illness: Pamela Choi is a 77 yo woman wtih PMH as outined below.  She is here for routine appointment that had been scheduled.  She is without complaints aside from her chronic rhinitis at this point.  Last visit she was prescribed flonase in addition to her zyrtec, however, in the past we have not used this secondary to nose bleeds and she does not wish to try again.  Therefore, she c/w meds but not flonase.  Also, labs ordered last visit were not drawn.    Preventive Screening-Counseling & Management  Alcohol-Tobacco     Alcohol drinks/day: 0     Smoking Status: quit     Year Quit: 19 YEARS AGO  Caffeine-Diet-Exercise     Does Patient Exercise: no     Type of exercise: waling     Times/week: 2  Current Medications (verified): 1)  Atenolol 50 Mg Tabs (Atenolol) .... Take One Tablet By Mouth Once Daily 2)  Norvasc 5 Mg Tabs (Amlodipine Besylate) .... Take One Tablet By Mouth Once Daily 3)  Cetirizine Hcl 10 Mg Tabs (Cetirizine Hcl)  .... Take 1 Tablet By Mouth Once A Day 4)  Pravachol 40 Mg Tabs (Pravastatin Sodium) .... Take 1 Tab By Mouth At Bedtime  Allergies (verified): 1)  ! 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 Bunionectomy R foot Hammertoe #2 on Right foot  Social History: Last updated: 05/10/2007 Former Smoker  Risk Factors: Alcohol Use: 0 (10/07/2010) Exercise: no (10/07/2010)  Risk Factors: Smoking Status: quit (10/07/2010)  Review of Systems      See HPI  Physical Exam  General:  alert and well-developed.  cooperative to examination.   Eyes:  vision grossly intact and no injection.   Lungs:  normal respiratory effort and no accessory muscle use.   Neurologic:  alert & oriented X3, cranial nerves II-XII intact, and gait normal.   Psych:  Oriented X3, memory intact for recent and remote, and normally interactive.  Impression & Recommendations:  Problem # 1:  HYPERLIPIDEMIA NEC/NOS (ICD-272.4) will check CMP and lipids today assume at goal  Her updated medication list for this problem includes:    Pravachol 40 Mg Tabs (Pravastatin sodium) .Marland Kitchen... Take 1 tab by mouth at bedtime  Labs Reviewed: SGOT: 21 (10/21/2009)   SGPT: 11 (10/21/2009)   HDL:45 (10/21/2009), 47 (09/06/2008)  LDL:68 (10/21/2009), 131 (81/19/1478)  Chol:127 (10/21/2009), 193 (09/06/2008)  Trig:71 (10/21/2009), 73 (09/06/2008)  Problem # 2:  RHINITIS, CHRONIC (ICD-472.0) c/w zyrtec no nasal steroids due to nosebleeds  Problem # 3:  HYPERTENSION (ICD-401.9)  check BMP at goal  Her updated medication list for this problem includes:    Atenolol 50 Mg Tabs (Atenolol) .Marland Kitchen... Take one tablet by mouth once  daily    Norvasc 5 Mg Tabs (Amlodipine besylate) .Marland Kitchen... Take one tablet by mouth once daily  BP today: 138/83 Prior BP: 142/84 (08/26/2010)  Labs Reviewed: K+: 4.1 (10/21/2009) Creat: : 1.04 (10/21/2009)   Chol: 127 (10/21/2009)   HDL: 45 (10/21/2009)   LDL: 68 (10/21/2009)   TG: 71 (10/21/2009)  Complete Medication List: 1)  Atenolol 50 Mg Tabs (Atenolol) .... Take one tablet by mouth once daily 2)  Norvasc 5 Mg Tabs (Amlodipine besylate) .... Take one tablet by mouth once daily 3)  Cetirizine Hcl 10 Mg Tabs (Cetirizine hcl) .... Take 1 tablet by mouth once a day 4)  Pravachol 40 Mg Tabs (Pravastatin sodium) .... Take 1 tab by mouth at bedtime  Patient Instructions: 1)  Please schedule a follow-up appointment in 6 months. 2)  Doing great as usual. 3)  Continue medicaitons below. 4)  Will check blood test as discussed, I expect them to be normal.  If there is anything that is not, we will call you.   5)  If you have any problems before your next visit, call clinic.    Prevention & Chronic Care Immunizations   Influenza vaccine: Fluvax MCR  (08/26/2010)    Tetanus booster: Not documented   Td booster deferral: Deferred  (10/07/2010)    Pneumococcal vaccine: Not documented   Pneumococcal vaccine deferral: Deferred  (10/07/2010)    H. zoster vaccine: Not documented   H. zoster vaccine deferral: Not available  (10/07/2010)  Colorectal Screening   Hemoccult: Negative  (08/07/2006)   Hemoccult action/deferral: Ordered  (10/20/2009)    Colonoscopy: Not documented   Colonoscopy action/deferral: Refused  (10/20/2009)  Other Screening   Pap smear: Not documented   Pap smear action/deferral: Not indicated-other  (02/23/2010)    Mammogram: Not documented   Mammogram action/deferral: Ordered  (02/23/2010)   Mammogram due: 01/27/2011    DXA bone density scan: Not documented   DXA bone density action/deferral: Refused  (08/26/2010)   Smoking status: quit   (10/07/2010)  Lipids   Total Cholesterol: 127  (10/21/2009)   Lipid panel action/deferral: Lipid Panel ordered   LDL: 68  (10/21/2009)   LDL Direct: Not documented   HDL: 45  (10/21/2009)   Triglycerides: 71  (10/21/2009)    SGOT (AST): 21  (10/21/2009)   BMP action: Ordered   SGPT (ALT): 11  (10/21/2009)   Alkaline phosphatase: 114  (10/21/2009)   Total bilirubin: 0.3  (10/21/2009)    Lipid flowsheet reviewed?: Yes   Progress toward LDL goal: At goal  Hypertension   Last Blood Pressure: 138 / 83  (10/07/2010)   Serum creatinine: 1.04  (10/21/2009)   Serum potassium 4.1  (10/21/2009)    Hypertension flowsheet reviewed?: Yes  Progress toward BP goal: At goal  Self-Management Support :   Personal Goals (by the next clinic visit) :      Personal blood pressure goal: 130/80  (02/23/2010)     Personal LDL goal: 100  (02/23/2010)    Patient will work on the following items until the next clinic visit to reach self-care goals:     Medications and monitoring: take my medicines every day, bring all of my medications to every visit  (10/07/2010)     Eating: eat more vegetables, use fresh or frozen vegetables, eat foods that are low in salt, eat baked foods instead of fried foods, limit or avoid alcohol  (10/07/2010)     Activity: take a 30 minute walk every day  (08/26/2010)    Hypertension self-management support: Resources for patients handout, Written self-care plan  (10/07/2010)   Hypertension self-care plan printed.    Lipid self-management support: Resources for patients handout, Written self-care plan  (10/07/2010)   Lipid self-care plan printed.      Resource handout printed.

## 2011-04-07 ENCOUNTER — Encounter: Payer: Self-pay | Admitting: Internal Medicine

## 2011-04-19 ENCOUNTER — Other Ambulatory Visit: Payer: Self-pay | Admitting: Oncology

## 2011-04-19 DIAGNOSIS — Z853 Personal history of malignant neoplasm of breast: Secondary | ICD-10-CM

## 2011-04-19 DIAGNOSIS — Z1231 Encounter for screening mammogram for malignant neoplasm of breast: Secondary | ICD-10-CM

## 2011-04-22 ENCOUNTER — Other Ambulatory Visit: Payer: Self-pay | Admitting: Internal Medicine

## 2011-04-29 ENCOUNTER — Ambulatory Visit
Admission: RE | Admit: 2011-04-29 | Discharge: 2011-04-29 | Disposition: A | Discharge status: Home / Self Care | Payer: Medicare Other | Source: Ambulatory Visit | Attending: Oncology | Admitting: Oncology

## 2011-04-29 DIAGNOSIS — Z1231 Encounter for screening mammogram for malignant neoplasm of breast: Secondary | ICD-10-CM

## 2011-04-29 DIAGNOSIS — Z853 Personal history of malignant neoplasm of breast: Secondary | ICD-10-CM

## 2011-05-14 NOTE — Op Note (Signed)
NAME:  Pamela Choi, Pamela Choi                       ACCOUNT NO.:  [id number]   MEDICAL RECORD NO.:  [id number]                   PATIENT TYPE:  AMB   LOCATION:  DSC                                  FACILITY:  [facility]   PHYSICIAN:  Rose Phi. Maple Hudson, M.D.                DATE OF BIRTH:  04/07/1931   DATE OF PROCEDURE:  12/04/2003  DATE OF DISCHARGE:                                 OPERATIVE REPORT   PREOPERATIVE DIAGNOSIS:  Early stage I carcinoma of the right breast.   POSTOPERATIVE DIAGNOSIS:  Early stage I carcinoma of the right breast.   PROCEDURE:  1. Blue dye injection.  2. Right sentinel lymph node biopsy.  3. Right partial mastectomy.   SURGEON:  Rose Phi. Maple Hudson, M.D.   ANESTHESIA:  General.   DESCRIPTION OF PROCEDURE:  Prior to coming the operating room, 1 mCi of  technetium sulfa colloid was injected intradermally in the periareolar  tissue.   After suitable general anesthesia was induced, the patient was placed in the  supine position with the arms extended on the arm board.  The palpable  nodule that represented the malignancy was in the upper inner quadrant of  the right breast at about the 1 o'clock position.  This was outlined with  marking pencil.  Then 5 mL of Choi mixture of 2 mL of methylene blue and 3 mL  of injectable saline was then injected in the subareolar tissue and the  breast gently massaged for about 3 minutes.   We then prepped and draped the breast and the axilla.   Choi short transverse right axillary incision was made with dissection through  the subcutaneous tissue to the clavipectoral fascia.  Just beneath the  clavipectoral fascia.  Just beneath the clavipectoral fascia was Choi blue and  hot lymph node which we excised as the sentinel node and then adjacent to it  was another warm slightly discolored lymph node which we lifted as Choi  separate node.   While that was being evaluated by the pathologist, Choi curved incision  incorporating Choi wedge of skin was then  outlined over the palpable nodule in  the upper inner quadrant of the right breast.  We then did Choi wide excision  of the tumor.  Hemostasis was obtained with the cautery.  Specimen oriented  for the pathologist.  It was then submitted to the pathologist for  evaluation of the margins.   The touch prep on the sentinel node was negative as were the margins of the  primary tumor.   The wounds were then closed with 3-0 Vicryl, subcuticular 4-0 Monocryl and  Steri-Strips.  Dressings applied.  The patient was transferred to the  recovery room in satisfactory condition having tolerated the procedure well.  Rose Phi. Maple Hudson, M.D.    PRY/MEDQ  D:  12/04/2003  T:  12/05/2003  Job:  [id number]

## 2011-05-14 NOTE — Op Note (Signed)
Pamela Choi, Pamela Choi             ACCOUNT NO.:  [id number]   MEDICAL RECORD NO.:  [id number]          PATIENT TYPE:  AMB   LOCATION:  DAY                          [facility]AC[facility]   PHYSICIAN:  Charlynne Pander, D.D.S.DATE OF BIRTH:  04/07/1931   DATE OF PROCEDURE:  09/17/2004  DATE OF DISCHARGE:                                 OPERATIVE REPORT   SURGEON:  Charlynne Pander, D.D.S.   PREOPERATIVE DIAGNOSES:  1.  Hypertension.  2.  History of breast cancer.  3.  Chronic apical periodontitis.  4.  Chronic periodontitis.  5.  Hyperplastic maxillary tuberosities.  6.  Soft tissue lesion in the area between teeth #8 and 9.  7.  Accretions.  8.  Dental caries.   POSTOPERATIVE DIAGNOSES:  1.  Hypertension.  2.  History of breast cancer.  3.  Chronic apical periodontitis.  4.  Chronic periodontitis.  5.  Hyperplastic maxillary tuberosities.  6.  Soft tissue lesion in the area between teeth #8 and 9.  7.  Accretions.  8.  Dental caries.   OPERATIONS:  1.  Dental examination.  2.  Surgical extraction of tooth #20 with alveoloplasty.  3.  Bilateral maxillary tuberosity reductions.  4.  Gross debridement of the remaining dentition.  5.  Soft tissue biopsy of the lesion in the area between teeth #8 and 9.   SURGEON:  Charlynne Pander, D.D.S.   ASSISTANT:  Elliot Dally, Sales executive.   ANESTHESIA:  1.  General anesthesia via naso-endotracheal tube.  2.  Local anesthesia with total utilization of four carpules, each      containing 36 mg of Xylocaine with 0.018 mg of epinephrine.   MEDICATIONS:  Ampicillin 2.0 gm IV prior to invasive dental procedures.   SPECIMENS:  1.  There was one tooth which was discarded.  2.  A soft tissue biopsy of a lesion between teeth #8 and 9.  This measured      7 x 3 mm and was most likely a traumatic fibroma.   DRAINS:  None.   CULTURES:  None.   COMPLICATIONS:  None.   FLUIDS:  1100 ml of lactated Ringer's solution.   ESTIMATED  BLOOD LOSS:  Less than 50 ml.   INDICATIONS:  Patient has a history of hypertension and breast cancer.  The  patient was referred for evaluation of acute pulpitis symptoms associated  with tooth #20.  The patient was examined.  Treatment plan for extraction of  tooth #20 with alveoloplasty, reduction of the hyperplastic maxillary  tuberosities, removal of a soft tissue lesion between teeth #8 and 9, the  edentulous alveolar ridge area, and gross debridement of the remaining  dentition, as indicated.  This treatment plan was formulated to decrease the  risks of complications associated with dental infection from affecting the  patient's systemic health and to assist in future prosthodontics  rehabilitation.   OPERATIVE FINDINGS:  The patient was examined in operating room #6 at [facility]  Tooth #20 was identified for extraction.  The patient was noted to  effected by chronic periodontitis, accretions, chronic apical periodontitis,  maxillary right and left hyperplastic tuberosities, and dental caries.  The  dental caries will be restored at future outpatient appointments.   DESCRIPTION OF PROCEDURE:  The patient was brought to the main operating  room, #6.  The patient was placed in a supine position on the operating room  table.  General anesthesia was then induced via naso-endotracheal tube.  A  throat pack was placed at this time.  Patient was then prepped and draped in  the usual manner for a dental medicine procedure.  The oral cavity was  thoroughly examined with the findings as noted above.  The patient was then  ready for the dental medicine procedure as follows:   Local anesthesia was administered with a total utilizing of 4 carpules each  containing 36 mg of Xylocaine with 0.018 mg of epinephrine.  The maxillary  left quadrant was first approached.  A 15 blade incision was made from the  distal of the tuberosity through the mesial of tooth #10.  A surgical flap  was then  reflected.  A 15 blade was then utilized to remove significant  hyperplastic tissue.  A surgical flap was then reflected, both buccally and  palatal.  Bone was then removed with a rongeur and bone file appropriately.  The soft tissue was undermined with a 15 blade and further soft tissue  recontouring was achieved with soft tissue scissors.  The surgical site was  then irrigated with copious amounts of sterile saline.  The tissues were  approximated and further trimmed appropriately.  The surgical site was then  closed utilizing 3-0 chromic gut suture from the distal of the tuberosity  through the mesial of #10, utilizing 3-0 chromic gut suture in a continuous  interrupted suture technique x1.   The maxillary right quadrant was then approached.  A 15 blade incision was  made through the distal of the tuberosity through the mesial of #7.  The  surgical flap was then reflected.  A 15 blade incision was utilized to  remove a significant amount of hyperplastic tissue.  A soft tissue flap was  then further reflected, and bone was removed utilizing a rongeurs and bone  file appropriately without impingement on the maxillary sinus.  The  maxillary sinus was not noted on the maxillary left as well.  Irrigation was  then performed utilizing copious amounts of sterile saline.  The soft  tissues were then further adjusted and contoured, as indicated.  The  surgical site was then closed, utilizing 3-0 chromic gut suture from the  distal of the tuberosity in the mesial of #7, utilizing 3-0 gut suture  material under continuous interrupted suture technique x1.  One additional  interrupted suture was then placed to further close the surgical site.   At this point in time, the mandibular left quadrant was then approached.  Anesthesia had been delivered previously.  A 15 blade incision was made from the distal of #19 through the mesial of #21.  A surgical flap was then  reflected.  Tooth #20 was then  elevated with a series of straight elevators.  A 150 forceps was utilized to remove the coronal segment only, due to  extensive caries, which fractured at the gum line.  A surgical handpiece and  bur was then utilized to remove further buccal and interseptal bone.  The  remaining root was then elevated out with a straight elevator.  Alveoloplasty was then performed utilizing rongeurs and bone file.  The  surgical site was then  irrigated with copious amounts of sterile saline.  The surgical site was then closed from the distal of #19 through the mesial  of #21 utilizing a 3-0 chromic gut suture and a continuous interrupted  suture technique x1.  One periodontal interrupted suture was placed between  the papillae of teeth #21 and 22.  At this point in time, the remaining  teeth were approached.  A KaVO sonic scaler was then utilized to remove  significant accretions associated with remaining teeth.  A series of hand  curettes were then utilized to remove further accretions.  The KaVO sonic  scaler was again utilized to remove accretions.  Dental caries were noted  and will be restored as an outpatient.  At this point in time, the dental  medicine procedure was deemed to be complete.  The entire mouth was  irrigated with copious amounts of sterile saline.  The patient was examined  for complications, seeing none.  The dental medicine procedure was deemed to  be complete.  The throat pack was removed at this time.  The patient was  then handed over to the anesthesia team for final disposition.  After an  appropriate amount of time, the patient was extubated and taken to post  anesthesia care unit with stable vital signs and good oxygenation level.  All counts were correct for the dental medicine procedure.      RFK/MEDQ  D:  09/17/2004  T:  09/17/2004  Job:  [id number]   cc:   Valentino Hue. Magrinat, M.D.  [address] [facility]  [place]  [place] [address]  Fax: [phone]   Mont Dutton, MD  [facility]Internal Med. Resident  [place]  [place] [address]  Fax: [phone]   [facility]

## 2011-05-24 ENCOUNTER — Other Ambulatory Visit: Payer: Self-pay | Admitting: Internal Medicine

## 2011-06-01 ENCOUNTER — Other Ambulatory Visit: Payer: Self-pay | Admitting: Physician Assistant

## 2011-06-01 ENCOUNTER — Other Ambulatory Visit: Payer: Self-pay | Admitting: Oncology

## 2011-06-01 ENCOUNTER — Encounter (HOSPITAL_BASED_OUTPATIENT_CLINIC_OR_DEPARTMENT_OTHER): Payer: Medicare Other | Admitting: Oncology

## 2011-06-01 DIAGNOSIS — N6459 Other signs and symptoms in breast: Secondary | ICD-10-CM

## 2011-06-01 DIAGNOSIS — C50219 Malignant neoplasm of upper-inner quadrant of unspecified female breast: Secondary | ICD-10-CM

## 2011-06-01 LAB — COMPREHENSIVE METABOLIC PANEL
ALT: 11 U/L (ref 0–35)
CO2: 30 mEq/L (ref 19–32)
Calcium: 9.2 mg/dL (ref 8.4–10.5)
Chloride: 106 mEq/L (ref 96–112)
Potassium: 4.1 mEq/L (ref 3.5–5.3)
Sodium: 142 mEq/L (ref 135–145)
Total Protein: 6.8 g/dL (ref 6.0–8.3)

## 2011-06-01 LAB — CBC WITH DIFFERENTIAL/PLATELET
BASO%: 0.5 % (ref 0.0–2.0)
HCT: 36.3 % (ref 34.8–46.6)
MCHC: 33.5 g/dL (ref 31.5–36.0)
MONO#: 0.4 10*3/uL (ref 0.1–0.9)
NEUT%: 66.5 % (ref 38.4–76.8)
WBC: 4.4 10*3/uL (ref 3.9–10.3)
lymph#: 0.9 10*3/uL (ref 0.9–3.3)

## 2011-06-01 LAB — CANCER ANTIGEN 27.29: CA 27.29: 17 U/mL (ref 0–39)

## 2011-06-07 ENCOUNTER — Ambulatory Visit
Admission: RE | Admit: 2011-06-07 | Discharge: 2011-06-07 | Disposition: A | Discharge status: Home / Self Care | Payer: Medicare Other | Source: Ambulatory Visit | Attending: Oncology | Admitting: Oncology

## 2011-06-07 DIAGNOSIS — N6459 Other signs and symptoms in breast: Secondary | ICD-10-CM

## 2011-06-11 ENCOUNTER — Encounter: Payer: Self-pay | Admitting: Internal Medicine

## 2011-06-11 ENCOUNTER — Other Ambulatory Visit: Payer: Self-pay | Admitting: Internal Medicine

## 2011-06-22 ENCOUNTER — Other Ambulatory Visit: Payer: Self-pay | Admitting: Internal Medicine

## 2011-08-02 ENCOUNTER — Encounter: Payer: Self-pay | Admitting: Internal Medicine

## 2011-08-02 ENCOUNTER — Ambulatory Visit (INDEPENDENT_AMBULATORY_CARE_PROVIDER_SITE_OTHER): Payer: Medicare Other | Admitting: Internal Medicine

## 2011-08-02 DIAGNOSIS — I10 Essential (primary) hypertension: Secondary | ICD-10-CM

## 2011-08-02 DIAGNOSIS — E785 Hyperlipidemia, unspecified: Secondary | ICD-10-CM

## 2011-08-02 DIAGNOSIS — J310 Chronic rhinitis: Secondary | ICD-10-CM

## 2011-08-02 DIAGNOSIS — Z0000 Encounter for general adult medical examination without abnormal findings: Secondary | ICD-10-CM

## 2011-08-02 MED ORDER — AMLODIPINE BESYLATE 5 MG PO TABS: 5.0000 mg | ORAL_TABLET | Freq: Every day | ORAL | Status: DC

## 2011-08-02 MED ORDER — ATENOLOL 50 MG PO TABS: 50.0000 mg | ORAL_TABLET | Freq: Every day | ORAL | Status: DC

## 2011-08-02 MED ORDER — PRAVASTATIN SODIUM 40 MG PO TABS: 40.0000 mg | ORAL_TABLET | Freq: Every day | ORAL | Status: DC

## 2011-08-02 MED ORDER — CETIRIZINE HCL 10 MG PO TABS: 10.0000 mg | ORAL_TABLET | Freq: Every day | ORAL | Status: DC

## 2011-08-02 NOTE — Assessment & Plan Note (Signed)
Patient does not remember when she had last colonoscopy. Will try to obtain records to see when she had last time. Based on that ,will schedule an appointment.

## 2011-08-02 NOTE — Assessment & Plan Note (Signed)
Still complains of off and on sneezing. Continue on Zyrtec.

## 2011-08-02 NOTE — Progress Notes (Signed)
  Subjective:    Patient ID: Pamela Choi, female    DOB: 04/07/1931, 77 y.o.   MRN: [id number]  HPI: 77 year old woman with past medical history significant for hypertension,  breast carcinoma status post right partial mastectomy in December 2004 comes to the clinic for followup visit.   She denies any new complaints other than her seasonal allergies. She was still complaining of some off-and-on sneezing and dry cough. Denies any fever, nausea, vomiting or diarrhea.    Review of Systems  Constitutional: Negative for fever, activity change, appetite change and fatigue.  HENT: Positive for sneezing. Negative for hearing loss, ear pain, nosebleeds, congestion, rhinorrhea and postnasal drip.   Eyes: Negative for photophobia, discharge and itching.  Respiratory: Positive for cough. Negative for apnea, choking, chest tightness, shortness of breath, wheezing and stridor.   Cardiovascular: Negative for chest pain, palpitations and leg swelling.  Gastrointestinal: Negative for abdominal distention.  Genitourinary: Negative for dysuria, hematuria and flank pain.  Musculoskeletal: Negative for arthralgias.  Neurological: Negative for dizziness, seizures, facial asymmetry, light-headedness, numbness and headaches.  Hematological: Negative for adenopathy.       Objective:   Physical Exam  Constitutional: She is oriented to person, place, and time. She appears well-developed and well-nourished. No distress.  HENT:  Head: Normocephalic and atraumatic.  Mouth/Throat: No oropharyngeal exudate.  Eyes: Conjunctivae and EOM are normal. Pupils are equal, round, and reactive to light. Right eye exhibits no discharge. Left eye exhibits no discharge.  Neck: Normal range of motion. Neck supple. No JVD present. No tracheal deviation present. No thyromegaly present.  Cardiovascular: Normal rate, regular rhythm, normal heart sounds and intact distal pulses.  Exam reveals no gallop and no friction rub.   No  murmur heard. Pulmonary/Chest: Effort normal and breath sounds normal. No stridor. No respiratory distress. She has no wheezes. She has no rales. She exhibits no tenderness.  Abdominal: Soft. Bowel sounds are normal. She exhibits no distension and no mass. There is no tenderness. There is no rebound and no guarding.  Musculoskeletal: Normal range of motion. She exhibits no edema and no tenderness.  Lymphadenopathy:    She has no cervical adenopathy.  Neurological: She is alert and oriented to person, place, and time. She has normal reflexes. She displays normal reflexes. No cranial nerve deficit. She exhibits normal muscle tone.  Skin: Skin is warm. She is not diaphoretic.          Assessment & Plan:

## 2011-08-02 NOTE — Assessment & Plan Note (Signed)
Her cholesterol appears to be well-controlled. We'll recheck her lipid profile today. We'll make further changes based on the test results.

## 2011-08-02 NOTE — Assessment & Plan Note (Signed)
Blood pressure well controlled. Continue the current regimen. Lab Results  Component Value Date   NA 142 06/01/2011   NA 142 06/01/2011   NA 142 06/01/2011   NA 142 06/01/2011   K 4.1 06/01/2011   K 4.1 06/01/2011   K 4.1 06/01/2011   K 4.1 06/01/2011   CL 106 06/01/2011   CL 106 06/01/2011   CL 106 06/01/2011   CL 106 06/01/2011   CO2 30 06/01/2011   CO2 30 06/01/2011   CO2 30 06/01/2011   CO2 30 06/01/2011   BUN 16 06/01/2011   BUN 16 06/01/2011   BUN 16 06/01/2011   BUN 16 06/01/2011   CREATININE 0.97 06/01/2011   CREATININE 0.97 06/01/2011   CREATININE 0.97 06/01/2011   CREATININE 0.97 06/01/2011    BP Readings from Last 3 Encounters:  08/02/11 119/81  10/07/10 138/83  08/26/10 142/84

## 2011-08-02 NOTE — Patient Instructions (Addendum)
Please schedule a follow up appointment in 3-6 months. Please take your medicines as prescribed. i will call you with your lab results.

## 2011-08-03 LAB — LIPID PANEL
HDL: 58 mg/dL (ref >39)
LDL Cholesterol: 84 mg/dL (ref 0–99)

## 2011-08-19 ENCOUNTER — Ambulatory Visit (INDEPENDENT_AMBULATORY_CARE_PROVIDER_SITE_OTHER): Payer: Medicare Other | Admitting: Internal Medicine

## 2011-08-19 VITALS — BP 135/80 | HR 58 | Temp 97.9°F | Ht 66.0 in | Wt 126.6 lb

## 2011-08-19 DIAGNOSIS — Z23 Encounter for immunization: Secondary | ICD-10-CM

## 2011-08-19 DIAGNOSIS — Z0000 Encounter for general adult medical examination without abnormal findings: Secondary | ICD-10-CM

## 2011-08-19 DIAGNOSIS — E785 Hyperlipidemia, unspecified: Secondary | ICD-10-CM

## 2011-08-19 DIAGNOSIS — I10 Essential (primary) hypertension: Secondary | ICD-10-CM

## 2011-08-19 NOTE — Patient Instructions (Signed)
Please schedule a follow up appointment in 3- 6 months. Please take your medicines as prescribed.

## 2011-08-19 NOTE — Assessment & Plan Note (Signed)
Lab Results  Component Value Date   NA 142 06/01/2011   NA 142 06/01/2011   NA 142 06/01/2011   NA 142 06/01/2011   K 4.1 06/01/2011   K 4.1 06/01/2011   K 4.1 06/01/2011   K 4.1 06/01/2011   CL 106 06/01/2011   CL 106 06/01/2011   CL 106 06/01/2011   CL 106 06/01/2011   CO2 30 06/01/2011   CO2 30 06/01/2011   CO2 30 06/01/2011   CO2 30 06/01/2011   BUN 16 06/01/2011   BUN 16 06/01/2011   BUN 16 06/01/2011   BUN 16 06/01/2011   CREATININE 0.97 06/01/2011   CREATININE 0.97 06/01/2011   CREATININE 0.97 06/01/2011   CREATININE 0.97 06/01/2011    BP Readings from Last 3 Encounters:  08/19/11 135/80  08/02/11 119/81  10/07/10 138/83    Assessment: Hypertension control:  controlled  Progress toward goals:  at goal Barriers to meeting goals:  no barriers identified  Plan: Hypertension treatment:  continue current medications

## 2011-08-19 NOTE — Assessment & Plan Note (Signed)
She got pneumococcal shot today. Refused tetanus and flu shot.

## 2011-08-19 NOTE — Progress Notes (Signed)
  Subjective:    Patient ID: Pamela Choi, female    DOB: 04/07/1931, 77 y.o.   MRN: [id number]  HPI: 77 year old woman with past medical history significant for hyperlipidemia, hypertension comes to the clinic for a followup visit.  Patient was seen in the clinic about 2 weeks ago. She had written in her calendar to see a doctor for today and mistakenly came to our clinic. Denies any new complaints today including chest pain, shortness of breath, cough, nausea vomiting or diarrhea.    Review of Systems  Constitutional: Negative for fever, activity change, appetite change and fatigue.  HENT: Negative for nosebleeds, congestion, rhinorrhea, sneezing and postnasal drip.   Eyes: Negative for photophobia.  Respiratory: Negative for apnea, cough, choking, chest tightness, shortness of breath, wheezing and stridor.   Cardiovascular: Negative for chest pain, palpitations and leg swelling.  Gastrointestinal: Negative for abdominal pain, constipation and blood in stool.  Genitourinary: Negative for urgency, hematuria, flank pain and difficulty urinating.  Musculoskeletal: Negative for arthralgias.  Neurological: Negative for dizziness and headaches.       Objective:   Physical Exam  Constitutional: She is oriented to person, place, and time. She appears well-developed and well-nourished. No distress.  HENT:  Head: Normocephalic and atraumatic.  Mouth/Throat: No oropharyngeal exudate.  Eyes: Conjunctivae and EOM are normal. Pupils are equal, round, and reactive to light.  Neck: Normal range of motion. Neck supple. No JVD present. No tracheal deviation present. No thyromegaly present.  Cardiovascular: Normal rate, regular rhythm, normal heart sounds and intact distal pulses.  Exam reveals no gallop and no friction rub.   No murmur heard. Pulmonary/Chest: Effort normal and breath sounds normal. No stridor. No respiratory distress. She has no wheezes. She has no rales. She exhibits no  tenderness.  Abdominal: Soft. Bowel sounds are normal. She exhibits no distension and no mass. There is no tenderness. There is no rebound and no guarding.  Musculoskeletal: Normal range of motion. She exhibits no edema and no tenderness.  Lymphadenopathy:    She has no cervical adenopathy.  Neurological: She is alert and oriented to person, place, and time. She displays normal reflexes. No cranial nerve deficit. Coordination normal.  Skin: Skin is warm. She is not diaphoretic.          Assessment & Plan:

## 2011-08-19 NOTE — Assessment & Plan Note (Signed)
Her results from the lipid panel were discussed. Continue current regimen

## 2011-08-20 ENCOUNTER — Encounter: Payer: Self-pay | Admitting: Internal Medicine

## 2011-11-03 ENCOUNTER — Telehealth: Payer: Self-pay | Admitting: Oncology

## 2011-11-03 NOTE — Telephone Encounter (Signed)
called pt and informed her of appt on 12/10

## 2011-12-06 ENCOUNTER — Ambulatory Visit (HOSPITAL_BASED_OUTPATIENT_CLINIC_OR_DEPARTMENT_OTHER): Payer: Medicare Other | Admitting: Oncology

## 2011-12-06 ENCOUNTER — Ambulatory Visit: Payer: Medicare Other | Admitting: Lab

## 2011-12-06 ENCOUNTER — Telehealth: Payer: Self-pay | Admitting: *Deleted

## 2011-12-06 VITALS — BP 132/75 | HR 59 | Temp 97.6°F | Ht 66.0 in | Wt 125.9 lb

## 2011-12-06 DIAGNOSIS — C50219 Malignant neoplasm of upper-inner quadrant of unspecified female breast: Secondary | ICD-10-CM

## 2011-12-06 DIAGNOSIS — Z170 Estrogen receptor positive status [ER+]: Secondary | ICD-10-CM

## 2011-12-06 DIAGNOSIS — N63 Unspecified lump in unspecified breast: Secondary | ICD-10-CM

## 2011-12-06 DIAGNOSIS — C50919 Malignant neoplasm of unspecified site of unspecified female breast: Secondary | ICD-10-CM

## 2011-12-06 DIAGNOSIS — Z853 Personal history of malignant neoplasm of breast: Secondary | ICD-10-CM

## 2011-12-06 NOTE — Progress Notes (Signed)
ID: Pamela Choi   Interval History: He returns today for followup of her breast cancer. Interval history is significant for her having lost her brother about a week ago. She tells me died from cancer although she is not sure what kind of cancer it was. She says she is too anxious right now to really think straight and can't remember what his illness was.  ROS: The review of systems is otherwise benign except that she feels her right breast is increasing in size. Of course status post radiation breasts should usually be smaller. There is no pain or redness and she denies any injury to the right breast. She complains of loss of appetite a little bit of loss of hearing, but otherwise a detailed review of systems was negative except again for the issue of anxiety.  PMHx: Hypertension, osteoarthritis, history of remote tobacco abuse, history of removal of multiple "skin cysts"  Gynecologic history: GX P8. 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 She is divorced and lives by herself. She has 4 grandchildren and 3 great-grandchildren. She attends a [employer]  Medications: I have reviewed the patient's current medications.  Current Outpatient Prescriptions  Medication Sig Dispense Refill  . amLODipine (NORVASC) 5 MG tablet Take 1 tablet (5 mg total) by mouth daily.  90 tablet  2  . atenolol (TENORMIN) 50 MG tablet Take 1 tablet (50 mg total) by mouth daily.  90 tablet  2  . cetirizine (ZYRTEC) 10 MG tablet Take 1 tablet (10 mg total) by mouth daily.  30 tablet  6  . pravastatin (PRAVACHOL) 40 MG tablet Take 1 tablet (40 mg total) by mouth daily.  30 tablet  1     Objective:  Filed Vitals:   12/06/11 1113  BP: 132/75  Pulse: 59  Temp: 97.6 F (36.4 C)    Physical Exam:    Sclerae unicteric  Oropharynx clear  No peripheral adenopathy  Lungs clear -- no rales or rhonchi  Heart regular rate and rhythm  Abdomen benign  MSK no focal  spinal tenderness, no peripheral edema  Neuro nonfocal  Breast exam: The right breast does show an area of apparent swelling underneath the scar. This all could be scar tissue or it could be a fluid accumulation. There is no erythema or tenderness. This is not well demarcated. I do not feel a focal hard mass anywhere however. Left breast no suspicious findings.  Lab Results:   CMP    Chemistry      Component Value Date/Time   NA 142 06/01/2011 1038   NA 142 06/01/2011 1038   NA 142 06/01/2011 1038   NA 142 06/01/2011 1038   K 4.1 06/01/2011 1038   K 4.1 06/01/2011 1038   K 4.1 06/01/2011 1038   K 4.1 06/01/2011 1038   CL 106 06/01/2011 1038   CL 106 06/01/2011 1038   CL 106 06/01/2011 1038   CL 106 06/01/2011 1038   CO2 30 06/01/2011 1038   CO2 30 06/01/2011 1038   CO2 30 06/01/2011 1038   CO2 30 06/01/2011 1038   BUN 16 06/01/2011 1038   BUN 16 06/01/2011 1038   BUN 16 06/01/2011 1038   BUN 16 06/01/2011 1038   CREATININE 0.97 06/01/2011 1038   CREATININE 0.97 06/01/2011 1038   CREATININE 0.97 06/01/2011 1038   CREATININE 0.97 06/01/2011 1038      Component Value Date/Time   CALCIUM 9.2  06/01/2011 1038   CALCIUM 9.2 06/01/2011 1038   CALCIUM 9.2 06/01/2011 1038   CALCIUM 9.2 06/01/2011 1038   ALKPHOS 99 06/01/2011 1038   ALKPHOS 99 06/01/2011 1038   ALKPHOS 99 06/01/2011 1038   ALKPHOS 99 06/01/2011 1038   AST 26 06/01/2011 1038   AST 26 06/01/2011 1038   AST 26 06/01/2011 1038   AST 26 06/01/2011 1038   ALT 11 06/01/2011 1038   ALT 11 06/01/2011 1038   ALT 11 06/01/2011 1038   ALT 11 06/01/2011 1038   BILITOT 0.5 06/01/2011 1038   BILITOT 0.5 06/01/2011 1038   BILITOT 0.5 06/01/2011 1038   BILITOT 0.5 06/01/2011 1038       CBC Lab Results  Component Value Date   WBC 4.4 06/01/2011   HGB 12.1 06/01/2011   HCT 36.3 06/01/2011   MCV 100.5 06/01/2011   PLT 186 06/01/2011   NEUTROABS 2.9 06/01/2011    Studies/Results:  Mammography in 06-10-11 was unremarkable.  Assessment: 77 year old [place] woman status post right lumpectomy and  sentinel lymph node biopsy December of 2004 for a T1CN0 stage I infiltrating ductal carcinoma, grade 2, estrogen and progesterone receptor positive, HER-2/neu amplified; status post exemestane x5 years, completed March of 2010.   Plan: I am setting her up for right breast ultrasound and mammography this week. She will call for results. I do think what we are seeing is likely secondary to her prior surgery. In any case she will be due for repeat mammography in May, and we will see her again in 06/10/23 of next year.  MAGRINAT,GUSTAV C 12/06/2011

## 2011-12-06 NOTE — Telephone Encounter (Signed)
gave patient appointment for 12-17-2011 at 10:30am for mammogram and ultrasound gave patient appointment for 06-05-2012 starting at 10:45am printed out calendar and gave to the patient

## 2011-12-17 ENCOUNTER — Ambulatory Visit
Admission: RE | Admit: 2011-12-17 | Discharge: 2011-12-17 | Disposition: A | Discharge status: Home / Self Care | Payer: Medicare Other | Source: Ambulatory Visit | Attending: Oncology | Admitting: Oncology

## 2011-12-17 DIAGNOSIS — C50919 Malignant neoplasm of unspecified site of unspecified female breast: Secondary | ICD-10-CM

## 2011-12-19 ENCOUNTER — Other Ambulatory Visit: Payer: Self-pay | Admitting: Oncology

## 2011-12-19 DIAGNOSIS — C50919 Malignant neoplasm of unspecified site of unspecified female breast: Secondary | ICD-10-CM

## 2011-12-22 ENCOUNTER — Other Ambulatory Visit: Payer: Self-pay | Admitting: *Deleted

## 2011-12-22 ENCOUNTER — Telehealth: Payer: Self-pay | Admitting: *Deleted

## 2011-12-22 DIAGNOSIS — Z853 Personal history of malignant neoplasm of breast: Secondary | ICD-10-CM

## 2011-12-22 NOTE — Telephone Encounter (Signed)
spoke to [place] that schedules the mri of the breast order needs to say with and without giving order back to the dr on 12-22-2011

## 2011-12-23 ENCOUNTER — Telehealth: Payer: Self-pay | Admitting: *Deleted

## 2011-12-23 NOTE — Telephone Encounter (Signed)
left voice message to inform the patient of the new date and time of her mri of the breast on 12-26-2012 arrival time 12:45pm

## 2011-12-27 ENCOUNTER — Ambulatory Visit (HOSPITAL_COMMUNITY)
Admission: RE | Admit: 2011-12-27 | Discharge: 2011-12-27 | Disposition: A | Discharge status: Home / Self Care | Payer: Medicare Other | Source: Ambulatory Visit | Attending: Oncology | Admitting: Oncology

## 2011-12-27 DIAGNOSIS — Z853 Personal history of malignant neoplasm of breast: Secondary | ICD-10-CM | POA: Insufficient documentation

## 2011-12-27 LAB — CREATININE, SERUM
Creatinine, Ser: 0.89 mg/dL (ref 0.50–1.10)
GFR calc non Af Amer: 60 mL/min — ABNORMAL LOW (ref >90)

## 2012-01-15 ENCOUNTER — Ambulatory Visit (HOSPITAL_COMMUNITY)
Admission: RE | Admit: 2012-01-15 | Discharge: 2012-01-15 | Disposition: A | Discharge status: Home / Self Care | Payer: Medicare Other | Source: Ambulatory Visit | Attending: Oncology | Admitting: Oncology

## 2012-01-15 DIAGNOSIS — R609 Edema, unspecified: Secondary | ICD-10-CM | POA: Insufficient documentation

## 2012-01-15 DIAGNOSIS — C50919 Malignant neoplasm of unspecified site of unspecified female breast: Secondary | ICD-10-CM

## 2012-01-15 DIAGNOSIS — N62 Hypertrophy of breast: Secondary | ICD-10-CM | POA: Insufficient documentation

## 2012-01-17 ENCOUNTER — Other Ambulatory Visit: Payer: Self-pay | Admitting: Oncology

## 2012-01-17 DIAGNOSIS — C50919 Malignant neoplasm of unspecified site of unspecified female breast: Secondary | ICD-10-CM

## 2012-01-17 MED ORDER — GADOBENATE DIMEGLUMINE 529 MG/ML IV SOLN
11.0000 mL | Freq: Once | INTRAVENOUS | Status: AC | PRN
  Administered 2012-01-17: 11 mL via INTRAVENOUS

## 2012-01-19 ENCOUNTER — Ambulatory Visit
Admission: RE | Admit: 2012-01-19 | Discharge: 2012-01-19 | Disposition: A | Discharge status: Home / Self Care | Payer: Medicare Other | Source: Ambulatory Visit | Attending: Oncology | Admitting: Oncology

## 2012-01-19 DIAGNOSIS — Z853 Personal history of malignant neoplasm of breast: Secondary | ICD-10-CM

## 2012-06-05 ENCOUNTER — Ambulatory Visit: Payer: Medicare Other | Admitting: Physician Assistant

## 2012-06-05 ENCOUNTER — Other Ambulatory Visit: Payer: Medicare Other | Admitting: Lab

## 2012-06-05 NOTE — Progress Notes (Signed)
[facility] today.  Letter mailed to patient.  

## 2012-06-20 ENCOUNTER — Other Ambulatory Visit: Payer: Self-pay | Admitting: Internal Medicine

## 2012-08-11 ENCOUNTER — Other Ambulatory Visit: Payer: Self-pay | Admitting: Internal Medicine

## 2012-08-14 ENCOUNTER — Ambulatory Visit (INDEPENDENT_AMBULATORY_CARE_PROVIDER_SITE_OTHER): Payer: Medicare Other | Admitting: Internal Medicine

## 2012-08-14 ENCOUNTER — Encounter: Payer: Self-pay | Admitting: Internal Medicine

## 2012-08-14 VITALS — BP 133/79 | HR 67 | Temp 98.3°F | Ht 66.0 in | Wt 130.1 lb

## 2012-08-14 DIAGNOSIS — Z0000 Encounter for general adult medical examination without abnormal findings: Secondary | ICD-10-CM

## 2012-08-14 DIAGNOSIS — E785 Hyperlipidemia, unspecified: Secondary | ICD-10-CM

## 2012-08-14 DIAGNOSIS — C50919 Malignant neoplasm of unspecified site of unspecified female breast: Secondary | ICD-10-CM

## 2012-08-14 DIAGNOSIS — J302 Other seasonal allergic rhinitis: Secondary | ICD-10-CM

## 2012-08-14 DIAGNOSIS — J309 Allergic rhinitis, unspecified: Secondary | ICD-10-CM

## 2012-08-14 DIAGNOSIS — I10 Essential (primary) hypertension: Secondary | ICD-10-CM

## 2012-08-14 LAB — CBC WITH DIFFERENTIAL/PLATELET
Basophils Absolute: 0 10*3/uL (ref 0.0–0.1)
Eosinophils Relative: 3 % (ref 0–5)
HCT: 35.8 % — ABNORMAL LOW (ref 36.0–46.0)
Hemoglobin: 12 g/dL (ref 12.0–15.0)
Lymphocytes Relative: 34 % (ref 12–46)
MCHC: 33.5 g/dL (ref 30.0–36.0)
MCV: 96.8 fL (ref 78.0–100.0)
Monocytes Absolute: 0.4 10*3/uL (ref 0.1–1.0)
Monocytes Relative: 9 % (ref 3–12)
RDW: 13.2 % (ref 11.5–15.5)
WBC: 4.8 10*3/uL (ref 4.0–10.5)

## 2012-08-14 MED ORDER — CETIRIZINE HCL 10 MG PO TABS: 10.0000 mg | ORAL_TABLET | Freq: Every day | ORAL | Status: DC

## 2012-08-14 MED ORDER — AMLODIPINE BESYLATE 5 MG PO TABS: 5.0000 mg | ORAL_TABLET | Freq: Every day | ORAL | Status: DC

## 2012-08-14 MED ORDER — PRAVASTATIN SODIUM 40 MG PO TABS
40.0000 mg | ORAL_TABLET | Freq: Every day | ORAL | Status: DC
Start: 2012-08-14 — End: 2012-09-08

## 2012-08-14 MED ORDER — ATENOLOL 50 MG PO TABS: 50.0000 mg | ORAL_TABLET | Freq: Every day | ORAL | Status: DC

## 2012-08-14 NOTE — Assessment & Plan Note (Signed)
Check lipid panel and CMP. 

## 2012-08-14 NOTE — Progress Notes (Signed)
  Subjective:    Patient ID: Pamela Choi, female    DOB: 04/07/1931, 78 y.o.   MRN: [id number]  HPI 78 year old woman with past medical history significant for hypertension, hyperlipidemia, breast cancer ( ER/PR and Her/neu-2 positive) status post lumpectomy in 2004 comes to the clinic for a followup visit.  She reports occasional sneezing and coughing with changes in the season. Denies any fever, chills, congestion or increased lacrimation.      Review of Systems  Constitutional: Negative for fever and chills.  HENT: Positive for rhinorrhea and sneezing. Negative for congestion and postnasal drip.   Eyes: Negative for visual disturbance.  Respiratory: Negative for cough.   Gastrointestinal: Negative for abdominal pain and constipation.  Genitourinary: Negative for frequency, flank pain and dyspareunia.  Neurological: Negative for dizziness, tremors and facial asymmetry.       Objective:   Physical Exam  Constitutional: She is oriented to person, place, and time. She appears well-developed and well-nourished. No distress.  HENT:  Head: Normocephalic and atraumatic.  Mouth/Throat: No oropharyngeal exudate.  Eyes: Conjunctivae and EOM are normal. Pupils are equal, round, and reactive to light.  Neck: Normal range of motion. Neck supple. No JVD present. No tracheal deviation present. No thyromegaly present.  Cardiovascular: Normal rate, regular rhythm and normal heart sounds.  Exam reveals no gallop and no friction rub.   No murmur heard. Pulmonary/Chest: Effort normal and breath sounds normal. No stridor. No respiratory distress. She has no wheezes. She has no rales.  Abdominal: Soft. Bowel sounds are normal. She exhibits no distension. There is no tenderness.  Musculoskeletal: Normal range of motion. She exhibits no edema and no tenderness.  Lymphadenopathy:    She has no cervical adenopathy.  Neurological: She is alert and oriented to person, place, and time. She has normal  reflexes. No cranial nerve deficit. Coordination normal.  Skin: Skin is warm and dry. She is not diaphoretic.          Assessment & Plan:

## 2012-08-14 NOTE — Assessment & Plan Note (Signed)
Her last MR breast was in 2012 with BI-RADS 2 and benign findings. She has not seen an oncologist yet for this year. -She was encouraged to make an appointment- patient states that she would be following up with them in a month.

## 2012-08-14 NOTE — Assessment & Plan Note (Signed)
Lab Results  Component Value Date   NA 142 06/01/2011   NA 142 06/01/2011   NA 142 06/01/2011   NA 142 06/01/2011   K 4.1 06/01/2011   K 4.1 06/01/2011   K 4.1 06/01/2011   K 4.1 06/01/2011   CL 106 06/01/2011   CL 106 06/01/2011   CL 106 06/01/2011   CL 106 06/01/2011   CO2 30 06/01/2011   CO2 30 06/01/2011   CO2 30 06/01/2011   CO2 30 06/01/2011   BUN 16 06/01/2011   BUN 16 06/01/2011   BUN 16 06/01/2011   BUN 16 06/01/2011   CREATININE 0.89 12/27/2011    BP Readings from Last 3 Encounters:  08/14/12 133/79  12/06/11 132/75  08/19/11 135/80    Assessment: Hypertension control:  controlled  Progress toward goals:  at goal Barriers to meeting goals:  no barriers identified  Plan: Hypertension treatment:  continue current medications

## 2012-08-14 NOTE — Patient Instructions (Signed)
Please schedule a follow up appointment in 6 months . Please bring your medication bottles with your next appointment. Please take your medicines as prescribed. I will call you with your lab results if anything will be abnormal. 

## 2012-08-14 NOTE — Assessment & Plan Note (Signed)
She was offered shingles and Tdap Shot today she refused

## 2012-08-15 LAB — COMPLETE METABOLIC PANEL WITH GFR
CO2: 29 mEq/L (ref 19–32)
Creat: 0.9 mg/dL (ref 0.50–1.10)
GFR, Est African American: 70 mL/min
GFR, Est Non African American: 61 mL/min
Glucose, Bld: 95 mg/dL (ref 70–99)
Total Bilirubin: 0.4 mg/dL (ref 0.3–1.2)

## 2012-08-15 LAB — LIPID PANEL
HDL: 58 mg/dL (ref >39)
Triglycerides: 58 mg/dL (ref <150)

## 2012-09-05 ENCOUNTER — Ambulatory Visit (INDEPENDENT_AMBULATORY_CARE_PROVIDER_SITE_OTHER): Payer: Self-pay | Admitting: Surgery

## 2012-09-08 ENCOUNTER — Ambulatory Visit (INDEPENDENT_AMBULATORY_CARE_PROVIDER_SITE_OTHER): Payer: Medicare Other | Admitting: Surgery

## 2012-09-08 ENCOUNTER — Encounter (INDEPENDENT_AMBULATORY_CARE_PROVIDER_SITE_OTHER): Payer: Self-pay | Admitting: Surgery

## 2012-09-08 VITALS — BP 126/78 | HR 60 | Temp 97.4°F | Resp 16 | Ht 66.0 in | Wt 128.2 lb

## 2012-09-08 DIAGNOSIS — Z853 Personal history of malignant neoplasm of breast: Secondary | ICD-10-CM

## 2012-09-08 NOTE — Patient Instructions (Signed)
We will arrange a right mammogram to assess the change you have noticed in your right breast

## 2012-09-08 NOTE — Progress Notes (Signed)
NAME: Pamela Choi DOB: 04/07/1931 MRN: [id number]                                                                                      DATE: 09/08/2012  PCP: Elyse Jarvis, MD Referring Provider: Elyse Jarvis, MD  IMPRESSION:  History of right breast cancer , now with some changes noted by the patient above the lumpectomy scar  PLAN:   Will get a new mammogram. This area has been noted on previous exams here, but may be more prominent now                 CC:  Chief Complaint  Patient presents with  . Breast Problem    breast ck    HPI:  Pamela Choi is a 78 y.o.  female who presents for evaluation of similar to this she is noted in the right breast. She is about 9 years from a right breast lumpectomy and radiation therapy for a stage I receptor positive right breast cancer. On my prior exams had noticed that the breast was smaller with significant prominence of both lumpectomy sites. The patient notes that her breast used to be more flat and is now hard her and she thinks larger. She's noticed no problems with the left breast. She had mammogram and MRI about 6 months ago both of which were negative.  PMH:  has a past medical history of Hyperlipidemia; Hypertension; Allergy; Plantar wart of right foot; Osteopenia; and Breast cancer, stage 1 (12/04).  PSH:   has past surgical history that includes Breast surgery; Hammer toe surgery; and Bunionectomy.  ALLERGIES:  No Known Allergies  MEDICATIONS: Current outpatient prescriptions:amLODipine (NORVASC) 5 MG tablet, Take 1 tablet (5 mg total) by mouth daily., Disp: 90 tablet, Rfl: 1;  atenolol (TENORMIN) 50 MG tablet, Take 1 tablet (50 mg total) by mouth daily., Disp: 90 tablet, Rfl: 1;  cetirizine (ZYRTEC) 10 MG tablet, Take 1 tablet (10 mg total) by mouth daily., Disp: 30 tablet, Rfl: 6;  Multiple Vitamin (MULTIVITAMIN) capsule, Take 1 capsule by mouth daily., Disp: , Rfl:  pravastatin (PRAVACHOL) 40 MG tablet, TAKE 1 TABLET BY  MOUTH EVERY NIGHT AT BEDTIME, Disp: 30 tablet, Rfl: 2;  DISCONTD: pravastatin (PRAVACHOL) 40 MG tablet, Take 1 tablet (40 mg total) by mouth daily., Disp: 30 tablet, Rfl: 1  ROS: She has filled out our 12 point review of systems and it is negative. EXAM:   Vital signs:BP 126/78  Pulse 60  Temp 97.4 F (36.3 C) (Temporal)  Resp 16  Ht 5\' 6"  (1.676 m)  Wt 128 lb 3.2 oz (58.151 kg)  BMI 20.69 kg/m2 General: Patient is thin but otherwise healthy-appearing lady. I'm not sure there has not been a little bit of confusion creeping into her mental status. However she is oriented. Breasts: There is a well-healed surgical scar in the upper part of the right breast. Right breast is smaller than the left. The left is completely normal. There is a marked prominence and fullness and firm feeling area above the scar measuring about 5 cm.  DATA REVIEWED:  I reviewed the old office notes here as well as  the MRI and mammogram done in January.    Hajira Verhagen J 09/08/2012  CC: Elyse Jarvis, MD, Elyse Jarvis, MD

## 2012-09-11 ENCOUNTER — Ambulatory Visit
Admission: RE | Admit: 2012-09-11 | Discharge: 2012-09-11 | Disposition: A | Discharge status: Home / Self Care | Payer: Medicare Other | Source: Ambulatory Visit | Attending: Surgery | Admitting: Surgery

## 2012-09-11 DIAGNOSIS — Z853 Personal history of malignant neoplasm of breast: Secondary | ICD-10-CM

## 2013-02-22 ENCOUNTER — Ambulatory Visit (INDEPENDENT_AMBULATORY_CARE_PROVIDER_SITE_OTHER): Payer: Medicare Other | Admitting: Internal Medicine

## 2013-02-22 ENCOUNTER — Encounter: Payer: Self-pay | Admitting: Internal Medicine

## 2013-02-22 VITALS — BP 127/75 | HR 65 | Temp 97.2°F | Wt 131.7 lb

## 2013-02-22 DIAGNOSIS — I10 Essential (primary) hypertension: Secondary | ICD-10-CM

## 2013-02-22 DIAGNOSIS — R413 Other amnesia: Secondary | ICD-10-CM

## 2013-02-22 DIAGNOSIS — L84 Corns and callosities: Secondary | ICD-10-CM

## 2013-02-22 NOTE — Assessment & Plan Note (Signed)
Blood Pressure at goal. Continue current medications

## 2013-02-22 NOTE — Progress Notes (Signed)
Subjective:   Patient ID: Pamela Choi female   DOB: 04/07/1931 79 y.o.   MRN: [id number]  HPI: 79 year old woman with past medical history significant for hypertension, hyperlipidemia presents to the clinic for a followup visit.  Patient is accompanied by her daughter expresses some concern with her mother's memory. Daughter reports that she has been noticing that her mother is having difficulty recalling things that were told to her about 15- 20 minutes ago ( having trouble with short term memory) which gets frustrating for her mother and sometimes for herself. . Otherwise her mother is pretty independent in her ADLs. She lives by herself, pays her bills independently. She is able to dress, bathe and cook herself .She has been taking her medications as directed. She has not been driving for several years which is not new for her.  Her son helps her with groceries and she pays him.  Patient herself denies having any problems. She is very sensitive about the issue. She states that she is feeling very nervous today and gets nervous when there are  conversations  about her memory.    Past Medical History  Diagnosis Date  . Hyperlipidemia   . Hypertension   . Allergy   . Plantar wart of right foot   . Osteopenia   . Breast cancer, stage 1 12/04    right   No family history on file. History   Social History  . Marital Status: Widowed    Spouse Name: N/A    Number of Children: N/A  . Years of Education: N/A   Occupational History  . Not on file.   Social History Main Topics  . Smoking status: Former Smoker    Quit date: 08/02/2003  . Smokeless tobacco: Not on file  . Alcohol Use: No  . Drug Use: No  . Sexually Active: Not on file   Other Topics Concern  . Not on file   Social History Narrative  . No narrative on file   Review of Systems: General: Denies fever, chills, diaphoresis, appetite change and fatigue. HEENT: Denies photophobia, eye pain, redness, hearing loss,  ear pain, congestion, sore throat, rhinorrhea, sneezing, mouth sores, trouble swallowing, neck pain, neck stiffness and tinnitus. Respiratory: Denies SOB, DOE, cough, chest tightness, and wheezing. Cardiovascular: Denies to chest pain, palpitations and leg swelling. Gastrointestinal: Denies nausea, vomiting, abdominal pain, diarrhea, constipation, blood in stool and abdominal distention. Genitourinary: Denies dysuria, urgency, frequency, hematuria, flank pain and difficulty urinating. Musculoskeletal: Denies myalgias, back pain, joint swelling, arthralgias and gait problem.  Skin: Denies pallor, rash and wound. Neurological: Denies dizziness, seizures, syncope, weakness, light-headedness, numbness and headaches. Hematological: Denies adenopathy, easy bruising, personal or family bleeding history. Psychiatric/Behavioral: Denies suicidal ideation, mood changes, confusion, nervousness, sleep disturbance and agitation.    Current Outpatient Medications: Current Outpatient Prescriptions  Medication Sig Dispense Refill  . amLODipine (NORVASC) 5 MG tablet Take 1 tablet (5 mg total) by mouth daily.  90 tablet  1  . atenolol (TENORMIN) 50 MG tablet Take 1 tablet (50 mg total) by mouth daily.  90 tablet  1  . cetirizine (ZYRTEC) 10 MG tablet Take 1 tablet (10 mg total) by mouth daily.  30 tablet  6  . Multiple Vitamin (MULTIVITAMIN) capsule Take 1 capsule by mouth daily.      . pravastatin (PRAVACHOL) 40 MG tablet TAKE 1 TABLET BY MOUTH EVERY NIGHT AT BEDTIME  30 tablet  2   No current facility-administered medications for this visit.  Allergies: Allergies  Allergen Reactions  . Bee Venom       Objective:   Physical Exam: Filed Vitals:   02/22/13 1414  BP: 127/75  Pulse: 65  Temp: 97.2 F (36.2 C)    General: Vital signs reviewed and noted. Well-developed, well-nourished, in no acute distress; alert, appropriate and cooperative throughout examination. Head: Normocephalic,  atraumatic Lungs: Normal respiratory effort. Clear to auscultation BL without crackles or wheezes. Heart: RRR. S1 and S2 normal without gallop, murmur, or rubs. Abdomen:BS normoactive. Soft, Nondistended, non-tender.  No masses or organomegaly. Extremities: No pretibial edema. She has callus on the bottom of her left feet and  a corn on the fifth digit of right toe.  Neuro: AX O x2 ( oriebnted to person, place , could not tell month and year but was able to tell have date of birth ), she has difficulty recalling three words that I told her to remember.    Assessment & Plan:

## 2013-02-22 NOTE — Assessment & Plan Note (Addendum)
Patient's daughter reports that her mother is having some problems with her short-term memory. On my exam she was alert oriented x2 and had difficulty with the recalling the 3 words ( was able to recall 2 out of 3). She was not compliant to complete the full MMSE or mini- cog as she and her daughter reported that she is very anxious today. I think this patient might be borderline between mild cognitive impairment and beginning of early dementia. Patient still has intact executive functioning which makes me think that it is more likely mild cognitive impairment. Results are confounded by her anxiety.  Daughter wanted her mother to be started on medications but her mother refused that. Patient and daughter were educated that the medicines would not halt the progression of the disease , they would help in slowing down the intellectual decline. Daughter still wanted to try something but patient was adamant not to take it. I think this patient might benefit from cognitive rehab and  occupational training, where they can do some memory training. Patient's daughter wants to think about it. They were also given the information on [facility] which would be a good option for this patient - Check vitamin B12, TSH, electrolytes to look for any reversible causes for memory loss.  Update: Her lab results are WNL.

## 2013-02-22 NOTE — Patient Instructions (Addendum)
General Instructions: Please schedule a follow up appointment in 6 months . Please bring your medication bottles with your next appointment. Please take your medicines as prescribed.     Treatment Goals:  Goals (1 Years of Data) as of 02/22/13   None      Progress Toward Treatment Goals:    Self Care Goals & Plans:  Self Care Goal 02/22/2013  Manage my medications take my medicines as prescribed; bring my medications to every visit; refill my medications on time; follow the sick day instructions if I am sick  Eat healthy foods eat more vegetables; eat fruit for snacks and desserts; eat foods that are low in salt; eat baked foods instead of fried foods; eat smaller portions; drink diet soda or water instead of juice or soda  Be physically active take a walk every day; find an activity I enjoy       Care Management & Community Referrals:

## 2013-02-22 NOTE — Assessment & Plan Note (Signed)
Was noted to have callus and corn on her feet that have been bothering her.  -Would prefer her to podiatrist

## 2013-02-27 ENCOUNTER — Other Ambulatory Visit: Payer: Medicare Other

## 2013-02-28 ENCOUNTER — Other Ambulatory Visit (INDEPENDENT_AMBULATORY_CARE_PROVIDER_SITE_OTHER): Payer: Medicare Other

## 2013-02-28 DIAGNOSIS — R413 Other amnesia: Secondary | ICD-10-CM

## 2013-02-28 LAB — CBC
MCV: 95.8 fL (ref 78.0–100.0)
Platelets: 212 10*3/uL (ref 150–400)
RBC: 3.82 MIL/uL — ABNORMAL LOW (ref 3.87–5.11)
RDW: 13.4 % (ref 11.5–15.5)
WBC: 4.4 10*3/uL (ref 4.0–10.5)

## 2013-02-28 LAB — COMPLETE METABOLIC PANEL WITH GFR
AST: 30 U/L (ref 0–37)
BUN: 16 mg/dL (ref 6–23)
Calcium: 9.1 mg/dL (ref 8.4–10.5)
Chloride: 109 mEq/L (ref 96–112)
Creat: 0.92 mg/dL (ref 0.50–1.10)
GFR, Est African American: 68 mL/min
GFR, Est Non African American: 59 mL/min — ABNORMAL LOW
Glucose, Bld: 77 mg/dL (ref 70–99)

## 2013-02-28 LAB — VITAMIN B12: Vitamin B-12: 361 pg/mL (ref 211–911)

## 2013-02-28 LAB — TSH: TSH: 2.885 u[IU]/mL (ref 0.350–4.500)

## 2013-03-22 ENCOUNTER — Telehealth: Payer: Self-pay | Admitting: *Deleted

## 2013-03-22 DIAGNOSIS — R413 Other amnesia: Secondary | ICD-10-CM

## 2013-03-22 NOTE — Telephone Encounter (Signed)
Call from [place], pt's  daughter. She wanted you to know she got your message about lab work.  Daughter is asking for a referral to local doctor for evaluation of memory issues her mother has, they do not want to go to [facility]. Phone # [phone]

## 2013-03-23 NOTE — Telephone Encounter (Signed)
Patient continues to live independently - her children are helping her with the finances. She is able to perform most of her ADL's by herself.   Based on my limited exam in the clinic ( patient was not very compliant , repetitively stated to be anxious but daughter thinks that to be her defense mechanism , I think she had mild cognitive impairment. Daughter requests her seen to be seen by a specialist- she is not interested in taking her mom to [facility] but would prefer something locally.  She is not interested in cognitive rehab where they can work on her some objective memory training.   Plan:  Would refer her to neurology for appropriate assessment. Would communicate that with my nurse on Monday.  Thanks, [employer]

## 2013-03-23 NOTE — Telephone Encounter (Signed)
I returned her phone call- daughter seems to be very concerned about her mother's memory loss. She continues to report that he has been having trouble with recent memory.

## 2013-04-03 ENCOUNTER — Other Ambulatory Visit: Payer: Self-pay

## 2013-04-03 DIAGNOSIS — Z1231 Encounter for screening mammogram for malignant neoplasm of breast: Secondary | ICD-10-CM

## 2013-04-03 DIAGNOSIS — Z853 Personal history of malignant neoplasm of breast: Secondary | ICD-10-CM

## 2013-04-03 DIAGNOSIS — Z9889 Other specified postprocedural states: Secondary | ICD-10-CM

## 2013-05-11 ENCOUNTER — Encounter: Payer: Self-pay | Admitting: Neurology

## 2013-05-11 ENCOUNTER — Ambulatory Visit
Admission: RE | Admit: 2013-05-11 | Discharge: 2013-05-11 | Disposition: A | Discharge status: Home / Self Care | Payer: Medicare Other | Source: Ambulatory Visit

## 2013-05-11 ENCOUNTER — Ambulatory Visit (INDEPENDENT_AMBULATORY_CARE_PROVIDER_SITE_OTHER): Payer: Medicare Other | Admitting: Neurology

## 2013-05-11 VITALS — BP 124/73 | HR 64 | Ht 65.0 in | Wt 134.0 lb

## 2013-05-11 DIAGNOSIS — Z1231 Encounter for screening mammogram for malignant neoplasm of breast: Secondary | ICD-10-CM

## 2013-05-11 DIAGNOSIS — Z853 Personal history of malignant neoplasm of breast: Secondary | ICD-10-CM

## 2013-05-11 DIAGNOSIS — Z9889 Other specified postprocedural states: Secondary | ICD-10-CM

## 2013-05-11 DIAGNOSIS — R413 Other amnesia: Secondary | ICD-10-CM

## 2013-05-11 NOTE — Progress Notes (Signed)
Reason for visit: Memory disturbance  Pamela Choi is a 79 y.o. female  History of present illness:  Pamela Choi is an 79 year old left-handed black female with a history of memory issues that dates back at least 2 years. The family has noted that she will call them multiple times a day asking the same questions. 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 The patient does cook for herself, and there have been no safety issues with this. The patient is able to manage her own medications at this time, but the son has taken over the finances over the last 2 or 3 years. The patient denies any problems with balance, and no problems with control of the bowels or the bladder. The patient is sleeping well, and has a good energy level. The patient reports no focal numbness or weakness of the face, arms, or legs. The patient denies headache. The patient does not believe that she has any memory issues whatsoever. The patient comes to this office for further evaluation. There is no family history of memory issues.  Past Medical History  Diagnosis Date  . Hyperlipidemia   . Hypertension   . Allergy   . Plantar wart of right foot   . Osteopenia   . Breast cancer, stage 1 12/04    right  . Memory loss     Past Surgical History  Procedure Laterality Date  . Breast surgery    . Hammer toe surgery      Right foot  . Bunionectomy      R foot    Family History  Problem Relation Age of Onset  . Diabetes Mother   . Cancer Brother     Social history:  reports that she quit smoking about 9 years ago. She does not have any smokeless tobacco history on file. She reports that she does not drink alcohol or use illicit drugs.  Medications:  Current Outpatient Prescriptions on File Prior to Visit  Medication Sig Dispense Refill  . amLODipine (NORVASC) 5 MG tablet Take 1 tablet (5 mg total) by mouth daily.  90 tablet  1  . atenolol (TENORMIN) 50 MG tablet Take 1 tablet  (50 mg total) by mouth daily.  90 tablet  1  . cetirizine (ZYRTEC) 10 MG tablet Take 1 tablet (10 mg total) by mouth daily.  30 tablet  6  . Multiple Vitamin (MULTIVITAMIN) capsule Take 1 capsule by mouth daily.      . pravastatin (PRAVACHOL) 40 MG tablet TAKE 1 TABLET BY MOUTH EVERY NIGHT AT BEDTIME  30 tablet  2   No current facility-administered medications on file prior to visit.    Allergies:  Allergies  Allergen Reactions  . Bee Venom     ROS:  Out of a complete 14 system review of symptoms, the patient complains only of the following symptoms, and all other reviewed systems are negative.  Cough Memory disturbance  Blood pressure 124/73, pulse 64, height 5\' 5"  (1.651 m), weight 134 lb (60.782 kg).  Physical Exam  General: The patient is alert and cooperative at the time of the examination.  Head: Pupils are equal, round, and reactive to light. Discs are difficult to visualize bilaterally secondary to dense cataracts.  Neck: The neck is supple, no carotid bruits are noted.  Respiratory: The respiratory examination is clear.  Cardiovascular: The cardiovascular examination reveals a regular rate and rhythm, no obvious murmurs or rubs are noted.  Skin: Extremities are  without significant edema.  Neurologic Exam  Mental status: Mini-Mental status examination done today shows a total score 22/30. The patient is able to name 8 animals in 60 seconds.  Cranial nerves: Facial symmetry is present. There is good sensation of the face to pinprick and soft touch bilaterally. The strength of the facial muscles and the muscles to head turning and shoulder shrug are normal bilaterally. Speech is well enunciated, no aphasia or dysarthria is noted. Extraocular movements are full. Visual fields are full.  Motor: The motor testing reveals 5 over 5 strength of all 4 extremities. Good symmetric motor tone is noted throughout.  Sensory: Sensory testing is intact to pinprick, soft touch,  vibration sensation, and position sense on all 4 extremities. No evidence of extinction is noted.  Coordination: Cerebellar testing reveals good finger-nose-finger and heel-to-shin bilaterally.  Gait and station: Gait is normal. Tandem gait is normal. Romberg is negative. No drift is seen.  Reflexes: Deep tendon reflexes are symmetric and normal bilaterally, with exception that the ankle jerk reflexes are depressed bilaterally. Toes are downgoing bilaterally.   Assessment/Plan:  1. Memory disturbance  The patient is still living alone, and she is functioning relatively well. The patient likely has mild dementia, and this will need to be followed over time. The patient will be set up for further blood work, and a CT scan of the brain. The patient will followup in 3 or 4 months. Medications such as Aricept will be considered in the future. The patient has already had a thyroid profile and a B12 level that were unremarkable.  Marlan Palau MD 05/13/2013 6:50 PM  [facility] [address] [place], [place] [address]  Phone [phone] Fax [phone]

## 2013-05-12 LAB — RPR: RPR: REACTIVE — AB

## 2013-05-12 LAB — RPR TITER: RPR Ser-Titr: 1:8 {titer} — ABNORMAL HIGH

## 2013-05-13 ENCOUNTER — Telehealth: Payer: Self-pay | Admitting: Neurology

## 2013-05-13 NOTE — Telephone Encounter (Signed)
I called the son. The RPR came back positive, but it is in low titer. This is either a false positive, or it is consistent with a prior infection of syphilis that has been adequately treated.

## 2013-05-14 ENCOUNTER — Encounter: Payer: Self-pay | Admitting: Family Medicine

## 2013-05-14 ENCOUNTER — Ambulatory Visit (INDEPENDENT_AMBULATORY_CARE_PROVIDER_SITE_OTHER): Payer: Medicare Other | Admitting: Family Medicine

## 2013-05-14 VITALS — BP 122/84 | Temp 97.8°F | Ht 64.0 in | Wt 132.0 lb

## 2013-05-14 DIAGNOSIS — E785 Hyperlipidemia, unspecified: Secondary | ICD-10-CM

## 2013-05-14 DIAGNOSIS — R413 Other amnesia: Secondary | ICD-10-CM

## 2013-05-14 DIAGNOSIS — I10 Essential (primary) hypertension: Secondary | ICD-10-CM

## 2013-05-14 DIAGNOSIS — L84 Corns and callosities: Secondary | ICD-10-CM

## 2013-05-14 MED ORDER — PRAVASTATIN SODIUM 40 MG PO TABS: 40.0000 mg | ORAL_TABLET | Freq: Every day | ORAL | Status: DC

## 2013-05-14 MED ORDER — ATENOLOL 50 MG PO TABS: 50.0000 mg | ORAL_TABLET | Freq: Every day | ORAL | Status: DC

## 2013-05-14 MED ORDER — AMLODIPINE BESYLATE 5 MG PO TABS: 5.0000 mg | ORAL_TABLET | Freq: Every day | ORAL | Status: DC

## 2013-05-14 NOTE — Patient Instructions (Signed)
-  PLEASE SIGN UP FOR MYCHART TODAY   We recommend the following healthy lifestyle measures: - eat a healthy diet consisting of lots of vegetables, fruits, beans, nuts, seeds, healthy meats such as white chicken and fish and whole grains.  - avoid fried foods, fast food, processed foods, sodas, red meet and other fattening foods.  - get a least 150 minutes of aerobic exercise per week.   Follow up in: 4-6 months for health maintenance exam

## 2013-05-14 NOTE — Progress Notes (Addendum)
Chief Complaint  Patient presents with  . Establish Care    HPI:  Pamela Choi is here to establish care. Changing from [place] to here as daughter in law wants her to come here.  Last PCP and physical:  Has the following chronic problems and concerns today:  HTN/HLD: -takes pravastatin and atenolol and norvasc -denies: CP, SOB, palpitations, swelling -she has really worked on her diet and is very active  Depression per daughter in law: -few times per week gets upset and depressed and cries -daughter in law denies this -HCPOA: Glenna Fellows [phone]) and Nelma Rothman [phone]) -pt denies this and does not want to take medications for her depression - reports happy most of the time, no thoughts of suicide or self harm -patient lives alone - fixing all her own meals, dressing herself, manges finances mostly on her own - husband helps some -does not drive, does not forget to turn of the stove -no falls  Memory Loss: -is having workup with neurology -has follow up  Hx breat cancer - followed by Dr. Jamey Ripa and Oncology  Patient Active Problem List   Diagnosis Date Noted  . Memory loss 02/22/2013  . Callus of foot 02/22/2013  . Preventative health care 08/02/2011  . ANKLE SPRAIN, RIGHT 05/10/2007  . HYPERLIPIDEMIA NEC/NOS 01/11/2007  . HYPERTENSION 11/08/2006  . RHINITIS, CHRONIC 11/08/2006  . TOBACCO ABUSE, HX OF 11/08/2006  . CARCINOMA, INFILTRATING DUCTAL, RIGHT BREAST 11/27/2003    Health Maintenance: -she will follow up in 4 months for health maintenance exam  ROS: See pertinent positives and negatives per HPI.  Past Medical History  Diagnosis Date  . Hyperlipidemia   . Hypertension   . Allergy   . Plantar wart of right foot   . Osteopenia   . Breast cancer, stage 1 12/04    right  . Memory loss   . Chicken pox   . Seasonal allergies     Family History  Problem Relation Age of Onset  . Diabetes Mother   . Cancer Brother     History    Social History  . Marital Status: Widowed    Spouse Name: N/A    Number of Children: 8  . Years of Education: HS   Occupational History  . Retired    Social History Main Topics  . Smoking status: Former Smoker    Quit date: 08/02/2003  . Smokeless tobacco: None  . Alcohol Use: No  . Drug Use: No  . Sexually Active: None   Other Topics Concern  . None   Social History Narrative  . None    Current outpatient prescriptions:amLODipine (NORVASC) 5 MG tablet, Take 1 tablet (5 mg total) by mouth daily., Disp: 90 tablet, Rfl: 3;  atenolol (TENORMIN) 50 MG tablet, Take 1 tablet (50 mg total) by mouth daily., Disp: 90 tablet, Rfl: 3;  cetirizine (ZYRTEC) 10 MG tablet, Take 1 tablet (10 mg total) by mouth daily., Disp: 30 tablet, Rfl: 6;  Multiple Vitamin (MULTIVITAMIN) capsule, Take 1 capsule by mouth daily., Disp: , Rfl:  pravastatin (PRAVACHOL) 40 MG tablet, Take 1 tablet (40 mg total) by mouth daily., Disp: 90 tablet, Rfl: 3  EXAM:  Filed Vitals:   05/14/13 1054  BP: 122/84  Temp: 97.8 F (36.6 C)    Body mass index is 22.65 kg/(m^2).  GENERAL: vitals reviewed and listed above, alert, oriented, appears well hydrated and in no acute distress  HEENT: atraumatic, conjunttiva clear, no obvious abnormalities on inspection of  external nose and ears  NECK: no obvious masses on inspection  LUNGS: clear to auscultation bilaterally, no wheezes, rales or rhonchi, good air movement  CV: HRRR, no peripheral edema  MS: moves all extremities without noticeable abnormality  PSYCH: pleasant and cooperative, no obvious depression or anxiety  ASSESSMENT AND PLAN:  Discussed the following assessment and plan:  Hypertension - Plan: amLODipine (NORVASC) 5 MG tablet, atenolol (TENORMIN) 50 MG tablet  HYPERTENSION  Memory loss  Callus of foot  HYPERLIPIDEMIA NEC/NOS - Plan: pravastatin (PRAVACHOL) 40 MG tablet  -We reviewed the PMH, PSH, FH, SH, Meds and Allergies. -We  provided refills for any medications we will prescribe as needed. -We addressed current concerns per orders and patient instructions. -We have asked for records for pertinent exams, studies, vaccines and notes from previous providers. -reviewed recent labs, cholesterol done on regular basis and very stable - will repeat yearly at health maintenance exams -she will follow up with neurology regarding memory issues/ mod related to forgetting things. We discussed different types of dementia and treatment options - not interested at this time and will follow up with neurology -she will follow up with onc.surg for hx breast cancer -We have advised patient to follow up per instructions below.  -Patient advised to return or notify a doctor immediately if symptoms worsen or persist or new concerns arise.  Patient Instructions  -PLEASE SIGN UP FOR MYCHART TODAY   We recommend the following healthy lifestyle measures: - eat a healthy diet consisting of lots of vegetables, fruits, beans, nuts, seeds, healthy meats such as white chicken and fish and whole grains.  - avoid fried foods, fast food, processed foods, sodas, red meet and other fattening foods.  - get a least 150 minutes of aerobic exercise per week.   Follow up in: 4-6 months for health maintenance exam      Kalei Mckillop R.

## 2013-05-16 ENCOUNTER — Other Ambulatory Visit: Payer: Self-pay | Admitting: Neurology

## 2013-05-16 DIAGNOSIS — R413 Other amnesia: Secondary | ICD-10-CM

## 2013-05-30 ENCOUNTER — Encounter (INDEPENDENT_AMBULATORY_CARE_PROVIDER_SITE_OTHER): Payer: Self-pay | Admitting: Surgery

## 2013-05-30 ENCOUNTER — Ambulatory Visit (INDEPENDENT_AMBULATORY_CARE_PROVIDER_SITE_OTHER): Payer: Medicare Other | Admitting: Surgery

## 2013-05-30 VITALS — BP 120/70 | HR 74 | Temp 97.0°F | Resp 16 | Ht 64.0 in | Wt 132.0 lb

## 2013-05-30 DIAGNOSIS — Z853 Personal history of malignant neoplasm of breast: Secondary | ICD-10-CM

## 2013-05-30 NOTE — Progress Notes (Signed)
NAME: Pamela Choi       DOB: 1930-02-08           DATE: 05/30/2013       MRN: [id number]  CC:   Chief Complaint  Patient presents with  . Breast Cancer Long Term Follow Up    Pamela Choi is a 79 y.o.Marland Kitchenfemale who presents for routine followup of her Stage1, Receptor+, Right IDC diagnosed in 2004 and treated with lumpectomy (Dr Francina Ames), SLN, radiation and antiestrogen. She has no problems or concerns on either side, other than the right now seems to be less small than it has in  thepast  PFSH: She has had no significant changes since the last visit here.  ROS: There have been no significant changes since the last visit here  EXAM:  VS: BP 120/70  Pulse 74  Temp(Src) 97 F (36.1 C) (Temporal)  Resp 16  Ht 5\' 4"  (1.626 m)  Wt 132 lb (59.875 kg)  BMI 22.65 kg/m2  General: The patient is alert, oriented, generally healthy appearing, NAD. Mood and affect are normal.  Breasts:  The right breast is slighlty smaller than the left, prominent and firm above the lumpectomy scar, no dominant mass, mildly tender. Left is normal  Lymphatics: She has no axillary or supraclavicular adenopathy on either side.  Extremities: Full ROM of the surgical side with no lymphedema noted.  Data Reviewed: Mammogram May, 2014: FINDINGS:  ACR Breast Density Category 2: There is a scattered fibroglandular  pattern.  No suspicious masses, nonsurgical architectural distortion, or  suspicious calcifications are present. Post lumpectomy scarring in  the lower right breast and residual post radiation mild trabecular  thickening are unchanged.  Images were processed with CAD.  IMPRESSION:  No mammographic evidence of malignancy.  A result letter of this screening mammogram will be mailed directly  to the patient.  RECOMMENDATION:  Screening mammogram in one year. (Code:SM-B-01Y)  BI-RADS CATEGORY 2: Benign finding(s).  Original Report Authenticated By: Hulan Saas,  M.D.   Impression: Doing well, with no evidence of recurrent cancer or new cancer  Plan: RTC PRN.

## 2013-06-02 ENCOUNTER — Ambulatory Visit
Admission: RE | Admit: 2013-06-02 | Discharge: 2013-06-02 | Disposition: A | Discharge status: Home / Self Care | Payer: Medicare Other | Source: Ambulatory Visit | Attending: Neurology | Admitting: Neurology

## 2013-06-02 DIAGNOSIS — R413 Other amnesia: Secondary | ICD-10-CM

## 2013-06-04 ENCOUNTER — Telehealth: Payer: Self-pay | Admitting: Neurology

## 2013-06-04 NOTE — Telephone Encounter (Signed)
I called the patient and I talked with the caretaker. The MRi shows a moderate level of SVD, and I would recommend aspirin therapy. The patient does not wish to go on medications for memory ay this time, but they will call me if things change.

## 2013-08-16 ENCOUNTER — Ambulatory Visit (INDEPENDENT_AMBULATORY_CARE_PROVIDER_SITE_OTHER): Payer: Medicare Other | Admitting: Family Medicine

## 2013-08-16 ENCOUNTER — Encounter: Payer: Self-pay | Admitting: Family Medicine

## 2013-08-16 VITALS — BP 122/80 | Temp 98.1°F | Wt 136.0 lb

## 2013-08-16 DIAGNOSIS — H9190 Unspecified hearing loss, unspecified ear: Secondary | ICD-10-CM

## 2013-08-16 DIAGNOSIS — H6120 Impacted cerumen, unspecified ear: Secondary | ICD-10-CM

## 2013-08-16 DIAGNOSIS — H9319 Tinnitus, unspecified ear: Secondary | ICD-10-CM

## 2013-08-16 DIAGNOSIS — H9110 Presbycusis, unspecified ear: Secondary | ICD-10-CM

## 2013-08-16 DIAGNOSIS — H6123 Impacted cerumen, bilateral: Secondary | ICD-10-CM

## 2013-08-16 DIAGNOSIS — H9111 Presbycusis, right ear: Secondary | ICD-10-CM

## 2013-08-16 DIAGNOSIS — H9311 Tinnitus, right ear: Secondary | ICD-10-CM

## 2013-08-16 NOTE — Progress Notes (Signed)
Chief Complaint  Patient presents with  . Tinnitus    HPI:  Here for acute visit for ringing in ears: -high pitch tinnitus, not pulsitile -chronically for several years, mostly in R ear and gradually has had some issues with hearing on telephone as well -seems like has gradually gotten worse -denies: ear pain, fevers, HA, pulsatile tinnitus  ROS: See pertinent positives and negatives per HPI.  Past Medical History  Diagnosis Date  . Hyperlipidemia   . Hypertension   . Allergy   . Plantar wart of right foot   . Osteopenia   . Breast cancer, stage 1 12/04    right  . Memory loss   . Chicken pox   . Seasonal allergies     Family History  Problem Relation Age of Onset  . Diabetes Mother   . Cancer Brother     History   Social History  . Marital Status: Widowed    Spouse Name: N/A    Number of Children: 8  . Years of Education: HS   Occupational History  . Retired    Social History Main Topics  . Smoking status: Former Smoker    Quit date: 08/02/2003  . Smokeless tobacco: None  . Alcohol Use: No  . Drug Use: No  . Sexual Activity: None   Other Topics Concern  . None   Social History Narrative  . None    Current outpatient prescriptions:amLODipine (NORVASC) 5 MG tablet, Take 1 tablet (5 mg total) by mouth daily., Disp: 90 tablet, Rfl: 3;  atenolol (TENORMIN) 50 MG tablet, Take 1 tablet (50 mg total) by mouth daily., Disp: 90 tablet, Rfl: 3;  cetirizine (ZYRTEC) 10 MG tablet, Take 1 tablet (10 mg total) by mouth daily., Disp: 30 tablet, Rfl: 6;  Multiple Vitamin (MULTIVITAMIN) capsule, Take 1 capsule by mouth daily., Disp: , Rfl:  pravastatin (PRAVACHOL) 40 MG tablet, Take 1 tablet (40 mg total) by mouth daily., Disp: 90 tablet, Rfl: 3  EXAM:  Filed Vitals:   08/16/13 1411  BP: 122/80  Temp: 98.1 F (36.7 C)    Body mass index is 23.33 kg/(m^2).  GENERAL: vitals reviewed and listed above, alert, oriented, appears well hydrated and in no acute  distress  HEENT: atraumatic, conjunttiva clear, no obvious abnormalities on inspection of external nose and ears, small ear canals with cerumen impaction bilaterally, otherwise normal HEENT exam  NECK: no obvious masses on inspection, no carotid bruits  MS: moves all extremities without noticeable abnormality  PSYCH: pleasant and cooperative, no obvious depression or anxiety  ASSESSMENT AND PLAN:  Discussed the following assessment and plan:  Presbycusis, right  Tinnitus, right  Hearing decreased, unspecified laterality  Cerumen impaction, bilateral  -bilat cerumen impaction with small ear canals - discussed options and she will see ENT for removal and exam for tinnitus and hearing loss, discussed likely presbycusis - and will likely need to see audiologist after removal of ear wax -Patient advised to return or notify a doctor immediately if symptoms worsen or persist or new concerns arise.  Patient Instructions  -please call the Ear doctor to set up appointment to remove wax and address your hearing issues and ringing in ears     Ameris Akamine R.

## 2013-08-16 NOTE — Patient Instructions (Addendum)
-  please call the Ear doctor to set up appointment to remove wax and address your hearing issues and ringing in ears

## 2013-09-11 ENCOUNTER — Encounter: Payer: Self-pay | Admitting: Family Medicine

## 2013-09-11 NOTE — Progress Notes (Signed)
OV note from [facility] placed in scan box.

## 2013-09-28 ENCOUNTER — Ambulatory Visit (INDEPENDENT_AMBULATORY_CARE_PROVIDER_SITE_OTHER): Payer: Medicare Other

## 2013-09-28 DIAGNOSIS — Z23 Encounter for immunization: Secondary | ICD-10-CM

## 2013-10-15 ENCOUNTER — Encounter: Payer: Self-pay | Admitting: Podiatry

## 2013-10-15 ENCOUNTER — Ambulatory Visit (INDEPENDENT_AMBULATORY_CARE_PROVIDER_SITE_OTHER): Payer: Medicare Other | Admitting: Podiatry

## 2013-10-15 VITALS — BP 116/67 | HR 63 | Resp 18

## 2013-10-15 DIAGNOSIS — Q828 Other specified congenital malformations of skin: Secondary | ICD-10-CM

## 2013-10-15 DIAGNOSIS — B351 Tinea unguium: Secondary | ICD-10-CM

## 2013-10-15 DIAGNOSIS — L84 Corns and callosities: Secondary | ICD-10-CM

## 2013-10-15 DIAGNOSIS — M79609 Pain in unspecified limb: Secondary | ICD-10-CM

## 2013-10-15 NOTE — Progress Notes (Signed)
°  Subjective:    Patient ID: Pamela Choi, female    DOB: Feb 23, 1930, 79 y.o.   MRN: [id number]  HPI trim nails and callus on bottom on foot and corn on 2nd toe right and 5th toe on left    Review of Systems  Constitutional: Negative.   HENT: Negative.   Eyes: Negative.   Respiratory: Positive for cough.   Cardiovascular: Negative.   Gastrointestinal: Negative.   Endocrine: Negative.   Genitourinary: Negative.   Musculoskeletal: Negative.   Skin: Negative.   Allergic/Immunologic: Negative.   Neurological: Negative.   Hematological: Negative.   Psychiatric/Behavioral: Negative.        Objective:   Physical Exam        Assessment & Plan:

## 2013-10-16 NOTE — Progress Notes (Signed)
Subjective:     Patient ID: Pamela Choi, female   DOB: 07-09-1930, 79 y.o.   MRN: [id number]  HPI I have these painful corns that I cannot cut myself and my nails are thick and painful   Review of Systems     Objective:   Physical Exam pulses were intact bilateral the nailbeds are thickened and painful 1 through 5 bilateral. Keratotic lesion sub-fourth metatarsal and fourth digit with pain and lucent type center    Assessment:     Porokeratotic lesions x2 and nail disease 1-5 bilateral with pain    Plan:     Debridement of lesions bilateral no iatrogenic bleeding noted and debridement of nailbeds 1-5 bilateral painful no iatrogenic bleeding noted

## 2013-10-31 ENCOUNTER — Encounter: Payer: Medicare Other | Admitting: Family Medicine

## 2013-11-02 ENCOUNTER — Encounter: Payer: Medicare Other | Admitting: Family Medicine

## 2013-11-08 ENCOUNTER — Ambulatory Visit: Payer: Medicare Other | Admitting: Neurology

## 2013-11-19 ENCOUNTER — Ambulatory Visit (INDEPENDENT_AMBULATORY_CARE_PROVIDER_SITE_OTHER): Payer: Medicare Other | Admitting: Family Medicine

## 2013-11-19 ENCOUNTER — Encounter: Payer: Self-pay | Admitting: Family Medicine

## 2013-11-19 VITALS — BP 110/78 | Temp 98.5°F | Ht 64.5 in | Wt 134.0 lb

## 2013-11-19 DIAGNOSIS — Z23 Encounter for immunization: Secondary | ICD-10-CM

## 2013-11-19 DIAGNOSIS — R413 Other amnesia: Secondary | ICD-10-CM

## 2013-11-19 DIAGNOSIS — E785 Hyperlipidemia, unspecified: Secondary | ICD-10-CM

## 2013-11-19 DIAGNOSIS — Z0000 Encounter for general adult medical examination without abnormal findings: Secondary | ICD-10-CM

## 2013-11-19 DIAGNOSIS — I10 Essential (primary) hypertension: Secondary | ICD-10-CM

## 2013-11-19 NOTE — Progress Notes (Signed)
Pre visit review using our clinic review tool, if applicable. No additional management support is needed unless otherwise documented below in the visit note. 

## 2013-11-19 NOTE — Progress Notes (Signed)
Medicare Annual Preventive Care Visit  (initial annual wellness or annual wellness exam)  HTN/HLD: -stable  Depression: -per family, but pt denied - pt very active and independent  Memory Loss: -seeing neurology, had MRI with SVD - ASA was advised, but she doesn't want to take this  1.) Patient-completed health risk assessment  - completed and reviewed, see scanned documentation  2.) Review of Medical History: -PMH, PSH, Family History and current specialty and care providers reviewed and updated and listed below  - see chart and below  3.) Review of functional ability and level of safety:  Any difficulty hearing? NO  History of falling? NO  Any trouble with IADLs - using a phone, using transportation, grocery shopping, preparing meals, doing housework, doing laundry, taking medications and managing money? NO - she reports does not drive - but does her own shopping, cooking, finances, laundry  Advance Directives? YES  See summary of recommendations in Patient Instructions below.  4.) Physical Exam Filed Vitals:   11/19/13 0936  BP: 110/78  Temp: 98.5 F (36.9 C)   Estimated body mass index is 22.65 kg/(m^2) as calculated from the following:   Height as of this encounter: 5' 4.5" (1.638 m).   Weight as of this encounter: 134 lb (60.782 kg).  Mini Cog: 1. Patient instructed to listen carefully and repeat the following: Apple Watch    Penny  2. Clock drawing test was administered: NORMAL      3. Recall of three words  Scoring:  Number of words recalled? 0/3 Clock: normal Patient Score:  POS  See patient instructions for recommendations.  4)The following written screening schedule of preventive measures were reviewed with assessment and plan made per below, orders and patient instructions:       Alcohol screening - done     Obesity Screening and counseling - done     STI screening - N/A     Tobacco Screening - done       Pneumococcal (PPSV23 -one dose  after 64, one before if risk factors), influenza yearly and hepatitis B vaccines (if high risk - end stage renal disease, IV drugs, homosexual men, live in home for mentally retarded, hemophilia receiving factors) ASSESSMENT/PLAN: done      Screening mammograph (yearly if >40) ASSESSMENT/PLAN: hx of breast ca - followed by Dr. Jamey Ripa - last mammo may 2014, repeat yearly per recs      Screening Pap smear/pelvic exam (q2 years) ASSESSMENT/PLAN: not applicable      Colorectal cancer screening (FOBT yearly or flex sig q4y or colonoscopy q10y or barium enema q4y) ASSESSMENT/PLAN:patient refused      Diabetes outpatient self-management training services ASSESSMENT/PLAN: not applicable      Bone mass measurements(covered q2y if indicated - estrogen def, osteoporosis, hyperparathyroid, vertebral abnormalities, osteoporosis or steroids) ASSESSMENT/PLAN: patient refused, adivsed 1000IU of vitamin D3 and total of 1200mg  of calcium daily in diet and supplement      Screening for glaucoma(q1y if high risk - diabetes, FH, AA and > 50 or hispanic and > 65) ASSESSMENT/PLAN: sees eye doctor      Cardiovascular screening blood tests (lipids q5y) ASSESSMENT/PLAN: refused today, done last year and it was great      Diabetes screening tests ASSESSMENT/PLAN: refused today, done last year and great   7.) Summary: -risk factors and conditions per above assessment were discussed and treatment, recommendations and referrals were offered per documentation above and orders and patient instructions. -advised asa daily and follow up with neurology  for her cog impairment -advised labs, but she wants to do this at next visit -cont current medications, lifestyle recs -tdap given

## 2013-11-19 NOTE — Patient Instructions (Addendum)
Alcohol screening - done     Obesity Screening and counseling - done     STI screening - N/A     Tobacco Screening - done       Pneumococcal (PPSV23 -one dose after 64, one before if risk factors), influenza yearly and hepatitis B vaccines (if high risk - end stage renal disease, IV drugs, homosexual men, live in home for mentally retarded, hemophilia receiving factors) ASSESSMENT/PLAN: done      Screening mammograph (yearly if >40) ASSESSMENT/PLAN: history of breast cancer, followed by Dr. Jamey Ripa - mammo up to date      Screening Pap smear/pelvic exam (q2 years) ASSESSMENT/PLAN: not applicable      Colorectal cancer screening (FOBT yearly or flex sig q4y or colonoscopy q10y or barium enema q4y) ASSESSMENT/PLAN:patient refused      Diabetes outpatient self-management training services ASSESSMENT/PLAN: not applicable      Bone mass measurements(covered q2y if indicated - estrogen def, osteoporosis, hyperparathyroid, vertebral abnormalities, osteoporosis or steroids) ASSESSMENT/PLAN: patient refused, adivsed 1000IU of vitamin D3 and total of 1200mg  of calcium daily in diet and supplement      Screening for glaucoma(q1y if high risk - diabetes, FH, AA and > 50 or hispanic and > 65) ASSESSMENT/PLAN: sees eye doctor      Cardiovascular screening blood tests (lipids q5y) ASSESSMENT/PLAN: refused today, done last year and it was great      Diabetes screening tests ASSESSMENT/PLAN: refused today, done last year and great  Take and aspirin daily  Follow up 3-4 months and we will do labs then

## 2013-11-19 NOTE — Addendum Note (Signed)
Addended by: Kern Reap B on: 11/19/2013 10:41 AM   Modules accepted: Orders

## 2013-12-17 ENCOUNTER — Ambulatory Visit (INDEPENDENT_AMBULATORY_CARE_PROVIDER_SITE_OTHER): Payer: Medicare Other | Admitting: Podiatry

## 2013-12-17 ENCOUNTER — Encounter: Payer: Self-pay | Admitting: Podiatry

## 2013-12-17 VITALS — BP 132/72 | HR 66 | Resp 12

## 2013-12-17 DIAGNOSIS — L84 Corns and callosities: Secondary | ICD-10-CM

## 2013-12-17 DIAGNOSIS — M79609 Pain in unspecified limb: Secondary | ICD-10-CM

## 2013-12-17 DIAGNOSIS — B351 Tinea unguium: Secondary | ICD-10-CM

## 2013-12-17 NOTE — Progress Notes (Signed)
Subjective:     Patient ID: Pamela Choi, female   DOB: 23-Jan-1930, 79 y.o.   MRN: [id number]  HPI patient presents with thick nail disease 1-5 both feet and lesions fifth digit and fifth metatarsal both feet that are painful and she cannot cut   Review of Systems     Objective:   Physical Exam Neurovascular status intact with think incurvated nail bed 1-5 both feet that are painful when pressed and corn callus formation on the fifth toes and fifth metatarsals of both feet    Assessment:     Mycotic nail infection and lesion formation of both feet    Plan:     Debridement painful nailbeds 1-5 both feet and debrided lesions on both feet no iatrogenic bleeding

## 2014-03-18 ENCOUNTER — Ambulatory Visit (INDEPENDENT_AMBULATORY_CARE_PROVIDER_SITE_OTHER): Payer: Medicare Other | Admitting: Podiatry

## 2014-03-18 DIAGNOSIS — B350 Tinea barbae and tinea capitis: Secondary | ICD-10-CM

## 2014-03-18 DIAGNOSIS — M79609 Pain in unspecified limb: Secondary | ICD-10-CM

## 2014-03-18 DIAGNOSIS — L84 Corns and callosities: Secondary | ICD-10-CM

## 2014-03-18 DIAGNOSIS — B351 Tinea unguium: Secondary | ICD-10-CM

## 2014-03-19 NOTE — Progress Notes (Signed)
Subjective:     Patient ID: Pamela Choi, female   DOB: Dec 20, 1930, 81 y.o.   MRN: [id number]  HPI patient presents with thick deformed nails 1-5 both feet in very painful callus plantar aspect right foot   Review of Systems     Objective:   Physical Exam Neurovascular status intact with severe keratotic lesion sub-third metatarsal right and nail disease 1-5 both feet that are thick and painful    Assessment:     Mycotic nail infection 1-5 both feet and keratotic lesion sub-third metatarsal right    Plan:     Debridement nailbeds 1-5 both feet and debridement lesion on the right foot with no iatrogenic bleeding noted

## 2014-04-11 ENCOUNTER — Other Ambulatory Visit: Payer: Self-pay

## 2014-04-11 DIAGNOSIS — Z1231 Encounter for screening mammogram for malignant neoplasm of breast: Secondary | ICD-10-CM

## 2014-05-09 ENCOUNTER — Ambulatory Visit: Payer: Medicare Other | Admitting: Neurology

## 2014-05-10 ENCOUNTER — Telehealth: Payer: Self-pay | Admitting: Family Medicine

## 2014-05-10 DIAGNOSIS — E785 Hyperlipidemia, unspecified: Secondary | ICD-10-CM

## 2014-05-10 DIAGNOSIS — I10 Essential (primary) hypertension: Secondary | ICD-10-CM

## 2014-05-10 MED ORDER — PRAVASTATIN SODIUM 40 MG PO TABS: 40.0000 mg | ORAL_TABLET | Freq: Every day | ORAL | Status: DC

## 2014-05-10 MED ORDER — AMLODIPINE BESYLATE 5 MG PO TABS: 5.0000 mg | ORAL_TABLET | Freq: Every day | ORAL | Status: DC

## 2014-05-10 MED ORDER — ATENOLOL 50 MG PO TABS: 50.0000 mg | ORAL_TABLET | Freq: Every day | ORAL | Status: DC

## 2014-05-10 NOTE — Telephone Encounter (Signed)
Pt is needing new rx for atenolol (TENORMIN) 50 MG tablet, sent to [facility] on [address], pt has no more refills, states when she had her phy in Nov, dr. Maudie Mercury did not send in her rx, pt is scheduled for a phy on 11/25/14, pt has a few pills left.

## 2014-05-10 NOTE — Telephone Encounter (Signed)
Pt states she is out of all her meds amLODipine (NORVASC) 5 MG tablet, and pravastatin (pravachol)  40 mg.

## 2014-05-17 ENCOUNTER — Ambulatory Visit
Admission: RE | Admit: 2014-05-17 | Discharge: 2014-05-17 | Disposition: A | Discharge status: Home / Self Care | Payer: Self-pay | Source: Ambulatory Visit

## 2014-05-17 ENCOUNTER — Other Ambulatory Visit: Payer: Self-pay

## 2014-05-17 ENCOUNTER — Ambulatory Visit
Admission: RE | Admit: 2014-05-17 | Discharge: 2014-05-17 | Disposition: A | Discharge status: Home / Self Care | Payer: 59 | Source: Ambulatory Visit

## 2014-05-17 DIAGNOSIS — Z1231 Encounter for screening mammogram for malignant neoplasm of breast: Secondary | ICD-10-CM

## 2014-05-21 ENCOUNTER — Other Ambulatory Visit: Payer: Self-pay | Admitting: Family Medicine

## 2014-05-21 DIAGNOSIS — R928 Other abnormal and inconclusive findings on diagnostic imaging of breast: Secondary | ICD-10-CM

## 2014-05-31 ENCOUNTER — Ambulatory Visit
Admission: RE | Admit: 2014-05-31 | Discharge: 2014-05-31 | Disposition: A | Discharge status: Home / Self Care | Payer: Medicare Other | Source: Ambulatory Visit | Attending: Family Medicine | Admitting: Family Medicine

## 2014-05-31 ENCOUNTER — Encounter (INDEPENDENT_AMBULATORY_CARE_PROVIDER_SITE_OTHER): Payer: Self-pay

## 2014-05-31 DIAGNOSIS — R928 Other abnormal and inconclusive findings on diagnostic imaging of breast: Secondary | ICD-10-CM

## 2014-06-20 ENCOUNTER — Encounter: Payer: Self-pay | Admitting: Podiatry

## 2014-06-20 ENCOUNTER — Ambulatory Visit (INDEPENDENT_AMBULATORY_CARE_PROVIDER_SITE_OTHER): Payer: Medicare Other | Admitting: Podiatry

## 2014-06-20 DIAGNOSIS — M79673 Pain in unspecified foot: Secondary | ICD-10-CM

## 2014-06-20 DIAGNOSIS — B351 Tinea unguium: Secondary | ICD-10-CM

## 2014-06-20 DIAGNOSIS — M79609 Pain in unspecified limb: Secondary | ICD-10-CM

## 2014-06-20 DIAGNOSIS — L84 Corns and callosities: Secondary | ICD-10-CM

## 2014-06-20 NOTE — Progress Notes (Signed)
Subjective:     Patient ID: Pamela Choi, female   DOB: Mar 17, 1930, 80 y.o.   MRN: [id number]  HPI patient presents with nail disease 1-5 both feet with thickness and inability to cut and pain and lesions on several the toes of both feet that are painful when she walks   Review of Systems     Objective:   Physical Exam Neurovascular status unchanged with thick nailbeds 1-5 both feet that are painful and also lesions distal tip of several toes which are painful    Assessment:     Mycotic nail infection with pain and lesions distal third toes both feet    Plan:     Debris painful nailbeds 1-5 both feet and debris lesions both feet with no iatrogenic bleeding noted

## 2014-08-06 ENCOUNTER — Other Ambulatory Visit: Payer: Self-pay | Admitting: Family Medicine

## 2014-08-06 NOTE — Telephone Encounter (Signed)
Rxs done. 

## 2014-09-26 ENCOUNTER — Ambulatory Visit (INDEPENDENT_AMBULATORY_CARE_PROVIDER_SITE_OTHER): Payer: Medicare Other | Admitting: Podiatry

## 2014-09-26 DIAGNOSIS — M79673 Pain in unspecified foot: Secondary | ICD-10-CM

## 2014-09-26 DIAGNOSIS — B351 Tinea unguium: Secondary | ICD-10-CM

## 2014-09-26 NOTE — Progress Notes (Signed)
   Subjective:    Patient ID: Pamela Choi, female    DOB: 17-Dec-1930, 80 y.o.   MRN: [id number]  HPI  Pt presents for nail debridement  Review of Systems     Objective:   Physical Exam        Assessment & Plan:

## 2014-09-29 NOTE — Progress Notes (Signed)
Subjective:     Patient ID: Pamela Choi, female   DOB: 09/19/1930, 80 y.o.   MRN: [id number]  HPI patient presents with thick brittle yellow nailbeds 1-5 both feet that are painful when pressed   Review of Systems     Objective:   Physical Exam Neurovascular status intact with mycotic nail infection 1-5 both feet with pain    Assessment:     Mycotic nail infection bilateral    Plan:     Debris painful nailbeds 1-5 both feet with no iatrogenic bleeding noted      

## 2014-11-13 ENCOUNTER — Encounter: Payer: Self-pay | Admitting: Neurology

## 2014-11-19 ENCOUNTER — Encounter: Payer: Self-pay | Admitting: Neurology

## 2014-11-25 ENCOUNTER — Ambulatory Visit (INDEPENDENT_AMBULATORY_CARE_PROVIDER_SITE_OTHER): Payer: Medicare Other | Admitting: Family Medicine

## 2014-11-25 ENCOUNTER — Encounter: Payer: Self-pay | Admitting: Family Medicine

## 2014-11-25 VITALS — BP 118/80 | HR 65 | Temp 98.3°F | Ht 64.75 in | Wt 129.5 lb

## 2014-11-25 DIAGNOSIS — Z0000 Encounter for general adult medical examination without abnormal findings: Secondary | ICD-10-CM

## 2014-11-25 DIAGNOSIS — E785 Hyperlipidemia, unspecified: Secondary | ICD-10-CM

## 2014-11-25 DIAGNOSIS — Z23 Encounter for immunization: Secondary | ICD-10-CM

## 2014-11-25 DIAGNOSIS — I10 Essential (primary) hypertension: Secondary | ICD-10-CM

## 2014-11-25 LAB — BASIC METABOLIC PANEL
BUN: 14 mg/dL (ref 6–23)
CO2: 27 mEq/L (ref 19–32)
CREATININE: 0.8 mg/dL (ref 0.4–1.2)
Calcium: 8.8 mg/dL (ref 8.4–10.5)
Chloride: 106 mEq/L (ref 96–112)
GFR: 92.15 mL/min (ref >60.00)
GLUCOSE: 87 mg/dL (ref 70–99)
Potassium: 4.8 mEq/L (ref 3.5–5.1)
Sodium: 138 mEq/L (ref 135–145)

## 2014-11-25 LAB — LIPID PANEL
CHOL/HDL RATIO: 3
CHOLESTEROL: 178 mg/dL (ref 0–200)
HDL: 52.7 mg/dL (ref >39.00)
LDL CALC: 117 mg/dL — AB (ref 0–99)
NonHDL: 125.3
Triglycerides: 44 mg/dL (ref 0.0–149.0)
VLDL: 8.8 mg/dL (ref 0.0–40.0)

## 2014-11-25 MED ORDER — ATENOLOL 50 MG PO TABS: 50.0000 mg | ORAL_TABLET | Freq: Every day | ORAL | Status: DC

## 2014-11-25 MED ORDER — PRAVASTATIN SODIUM 40 MG PO TABS: 40.0000 mg | ORAL_TABLET | Freq: Every day | ORAL | Status: DC

## 2014-11-25 MED ORDER — AMLODIPINE BESYLATE 5 MG PO TABS: 5.0000 mg | ORAL_TABLET | Freq: Every day | ORAL | Status: DC

## 2014-11-25 NOTE — Progress Notes (Signed)
Medicare Annual Preventive Care Visit  (initial annual wellness or annual wellness exam)  Concerns and/or follow up today:  HTN/HLD: -meds: norvasc, atenolol, pravastatin -denies: CP, SOB, DOE, leg cramps -stable  Depression: -per family, but pt denied - pt very active and independent -not on medications  Memory Loss: -seeing neurology, had MRI with SVD - ASA was advised, but she doesn't want to take this  ROS: negative for report of fevers, unintentional weight loss, vision changes, vision loss, hearing loss or change, chest pain, sob, hemoptysis, melena, hematochezia, hematuria, genital discharge or lesions, falls, bleeding or bruising, loc, thoughts of suicide or self harm, memory loss  1.) Patient-completed health risk assessment  - completed and reviewed, see scanned documentation  2.) Review of Medical History: -PMH, PSH, Family History and current specialty and care providers reviewed and updated and listed below  - see scanned in document in chart and below  Past Medical History  Diagnosis Date  . Hyperlipidemia   . Hypertension   . Allergy   . Plantar wart of right foot   . Osteopenia   . Breast cancer, stage 1 12/04    right  . Memory loss   . Chicken pox   . Seasonal allergies   . CARCINOMA, INFILTRATING DUCTAL, RIGHT BREAST 11/27/2003    Annotation: grade2, ER/PR and Her2-/neu positive status post exemestane x5 years, completed March of 2010 Qualifier: History of  By: Norma Fredrickson MD, Larena Glassman     . TOBACCO ABUSE, HX OF 11/08/2006    Qualifier: Diagnosis of  By: Linden Dolin MD, Marjory Lies    . ANKLE SPRAIN, RIGHT 05/10/2007    Qualifier: Diagnosis of  By: Norma Fredrickson MD, Larena Glassman      Past Surgical History  Procedure Laterality Date  . Breast surgery    . Hammer toe surgery      Right foot  . Bunionectomy      R foot    History   Social History  . Marital Status: Widowed    Spouse Name: N/A    Number of Children: 8  . Years of Education: HS   Occupational  History  . Retired    Social History Main Topics  . Smoking status: Former Smoker    Quit date: 08/02/2003  . Smokeless tobacco: Not on file  . Alcohol Use: No  . Drug Use: No  . Sexual Activity: Not on file   Other Topics Concern  . Not on file   Social History Narrative    The patient has a family history of  3.) Review of functional ability and level of safety:  Any difficulty hearing? NO  History of falling? NO  Any trouble with IADLs - using a phone, using transportation, grocery shopping, preparing meals, doing housework, doing laundry, taking medications and managing money? Does not drive, but does her own shopping, cooking and laundry, her daughter handles her finances for her  Advance Directives? YES  See summary of recommendations in Patient Instructions below.  4.) Physical Exam Filed Vitals:   11/25/14 0822  BP: 118/80  Pulse: 65  Temp: 98.3 F (36.8 C)   Estimated body mass index is 21.71 kg/(m^2) as calculated from the following:   Height as of this encounter: 5' 4.75" (1.645 m).   Weight as of this encounter: 129 lb 8 oz (58.741 kg).  EKG (optional): deferred  General: alert, appear well hydrated and in no acute distress  HEENT: visual acuity grossly intact  CV: HRRR  Lungs: CTA bilaterally  Psych:  pleasant and cooperative, no obvious depression or anxiety  Mini Cog: 1. Patient instructed to listen carefully and repeat the following: [place]  2. Clock drawing test was administered: NORMAL       3. Recall of three words 0/3  See patient instructions for recommendations.  Education and counseling regarding the above review of health provided with a plan for the following: -see scanned patient completed form for further details -fall prevention strategies discussed  -healthy lifestyle discussed -importance and resources for completing advanced directives discussed -see patient instructions below for any other  recommendations provided  4)The following written screening schedule of preventive measures were reviewed with assessment and plan made per below, orders and patient instructions:      AAA screening - n/a     Alcohol screening - done     Obesity Screening and counseling done     STI screening - n/a     Tobacco Screening - done       Pneumococcal (PPSV23 -one dose after 64, one before if risk factors), influenza yearly and hepatitis B vaccines (if high risk - end stage renal disease, IV drugs, homosexual men, live in home for mentally retarded, hemophilia receiving factors) ASSESSMENT/PLAN: prevnar 13 advised      Screening mammograph (yearly if >40) ASSESSMENT/PLAN: hx breast ca, sees Dr. Margot Chimes, does yearly mammo      Screening Pap smear/pelvic exam (q2 years) ASSESSMENT/PLAN: n/a      Prostate cancer screening ASSESSMENT/PLAN: n/a      Colorectal cancer screening (FOBT yearly or flex sig q4y or colonoscopy q10y or barium enema q4y) ASSESSMENT/PLAN: declined      Diabetes outpatient self-management training services ASSESSMENT/PLAN: n/a      Bone mass measurements(covered q2y if indicated - estrogen def, osteoporosis, hyperparathyroid, vertebral abnormalities, osteoporosis or steroids) ASSESSMENT/PLAN:patient refused, adivsed 1000IU of vitamin D3 and total of $Remove'1200mg'UogONdK$  of calcium daily in diet and supplement      Screening for glaucoma(q1y if high risk - diabetes, FH, AA and > 50 or hispanic and > 65) ASSESSMENT/PLAN: followed by optho      Medical nutritional therapy for individuals with diabetes or renal disease ASSESSMENT/PLAN: n/a      Cardiovascular screening blood tests (lipids q5y) ASSESSMENT/PLAN: advised today      Diabetes screening tests ASSESSMENT/PLAN: advised today   7.) Summary: -risk factors and conditions per above assessment were discussed and treatment, recommendations and referrals were offered per documentation above and orders and patient  instructions.  Flu, prevnar and shingles vaccines offered.   Essential hypertension - Plan: Basic metabolic panel, amLODipine (NORVASC) 5 MG tablet, atenolol (TENORMIN) 50 MG tablet  Medicare annual wellness visit, subsequent  Hyperlipemia - Plan: Lipid Panel, pravastatin (PRAVACHOL) 40 MG tablet  Patient Instructions  BEFORE YOU LEAVE: -prevnar 13 and flu vaccine -labs  -We have ordered labs or studies at this visit. It can take up to 1-2 weeks for results and processing. We will contact you with instructions IF your results are abnormal. Normal results will be released to your [place]. If you have not heard from [place] or can not find your results in [facility] in 2 weeks please contact our office.  -PLEASE SIGN UP FOR MYCHART TODAY   We recommend the following healthy lifestyle measures: - eat a healthy diet consisting of lots of vegetables, fruits, beans, nuts, seeds, healthy meats such as white chicken and fish and whole grains.  - avoid fried foods, fast food, processed foods, sodas,  red meet and other fattening foods.  - get a least 150 minutes of aerobic exercise per week.

## 2014-11-25 NOTE — Patient Instructions (Signed)
BEFORE YOU LEAVE: -prevnar 13 and flu vaccine -labs  -We have ordered labs or studies at this visit. It can take up to 1-2 weeks for results and processing. We will contact you with instructions IF your results are abnormal. Normal results will be released to your [facility]. If you have not heard from [place] or can not find your results in [facility] in 2 weeks please contact our office.  -PLEASE SIGN UP FOR MYCHART TODAY   We recommend the following healthy lifestyle measures: - eat a healthy diet consisting of lots of vegetables, fruits, beans, nuts, seeds, healthy meats such as white chicken and fish and whole grains.  - avoid fried foods, fast food, processed foods, sodas, red meet and other fattening foods.  - get a least 150 minutes of aerobic exercise per week.

## 2014-11-25 NOTE — Progress Notes (Signed)
Pre visit review using our clinic review tool, if applicable. No additional management support is needed unless otherwise documented below in the visit note. 

## 2014-11-25 NOTE — Addendum Note (Signed)
Addended by: Agnes Lawrence on: 11/25/2014 10:26 AM   Modules accepted: Orders

## 2014-11-25 NOTE — Progress Notes (Signed)
Pamela Choi [phone]

## 2014-11-26 ENCOUNTER — Telehealth: Payer: Self-pay | Admitting: Family Medicine

## 2014-11-26 NOTE — Telephone Encounter (Signed)
emmi mailed  °

## 2014-12-30 ENCOUNTER — Ambulatory Visit (INDEPENDENT_AMBULATORY_CARE_PROVIDER_SITE_OTHER): Payer: 59 | Admitting: Podiatry

## 2014-12-30 DIAGNOSIS — L84 Corns and callosities: Secondary | ICD-10-CM

## 2014-12-30 DIAGNOSIS — M79673 Pain in unspecified foot: Secondary | ICD-10-CM

## 2014-12-30 DIAGNOSIS — B351 Tinea unguium: Secondary | ICD-10-CM

## 2014-12-30 NOTE — Progress Notes (Signed)
Subjective:     Patient ID: Pamela Choi, female   DOB: 1930/03/02, 81 y.o.   MRN: [id number]  HPI patient presents with thick brittle yellow nailbeds 1-5 both feet that are painful when pressed   Review of Systems     Objective:   Physical Exam Neurovascular status intact with mycotic nail infection 1-5 both feet with pain    Assessment:     Mycotic nail infection bilateral    Plan:     Debris painful nailbeds 1-5 both feet with no iatrogenic bleeding noted

## 2015-03-31 ENCOUNTER — Ambulatory Visit (INDEPENDENT_AMBULATORY_CARE_PROVIDER_SITE_OTHER): Payer: Medicare Other | Admitting: Podiatry

## 2015-03-31 DIAGNOSIS — B351 Tinea unguium: Secondary | ICD-10-CM

## 2015-03-31 DIAGNOSIS — L84 Corns and callosities: Secondary | ICD-10-CM

## 2015-03-31 DIAGNOSIS — M79673 Pain in unspecified foot: Secondary | ICD-10-CM

## 2015-03-31 NOTE — Progress Notes (Signed)
Subjective:     Patient ID: Pamela Choi, female   DOB: September 23, 1930, 81 y.o.   MRN: [id number]  HPI patient presents with thick brittle yellow nailbeds 1-5 both feet that are painful when pressed   Review of Systems     Objective:   Physical Exam Neurovascular status intact with mycotic nail infection 1-5 both feet with pain    Assessment:     Mycotic nail infection bilateral    Plan:     Debris painful nailbeds 1-5 both feet with no iatrogenic bleeding noted

## 2015-04-30 ENCOUNTER — Other Ambulatory Visit: Payer: Self-pay

## 2015-04-30 DIAGNOSIS — Z1231 Encounter for screening mammogram for malignant neoplasm of breast: Secondary | ICD-10-CM

## 2015-05-07 ENCOUNTER — Encounter: Payer: Self-pay | Admitting: Family Medicine

## 2015-05-07 ENCOUNTER — Ambulatory Visit (INDEPENDENT_AMBULATORY_CARE_PROVIDER_SITE_OTHER): Payer: Medicare Other | Admitting: Family Medicine

## 2015-05-07 VITALS — BP 118/70 | HR 75 | Temp 99.0°F | Ht 64.75 in | Wt 122.1 lb

## 2015-05-07 DIAGNOSIS — R413 Other amnesia: Secondary | ICD-10-CM

## 2015-05-07 DIAGNOSIS — E785 Hyperlipidemia, unspecified: Secondary | ICD-10-CM

## 2015-05-07 DIAGNOSIS — I10 Essential (primary) hypertension: Secondary | ICD-10-CM

## 2015-05-07 NOTE — Progress Notes (Signed)
HPI:  HTN/HLD: -meds: norvasc, atenolol, pravastatin -denies: CP, SOB, DOE, leg cramps -stable   Depression: -per family, but pt denied - pt very active and independent -not on medications -denies depression, SI  Memory Loss: -seeing neurology, had MRI with microvascular dz - ASA was advised, but she doesn't want to take this -denies worsening memory, HA, weakness  HM: -shingles vaccine: declined today  ROS: See pertinent positives and negatives per HPI.  Past Medical History  Diagnosis Date  . Hyperlipidemia   . Hypertension   . Allergy   . Plantar wart of right foot   . Osteopenia   . Memory loss   . Chicken pox   . CARCINOMA, INFILTRATING DUCTAL, RIGHT BREAST 11/27/2003    Annotation: grade2, ER/PR and Her2-/neu positive status post exemestane x5 years, completed March of 2010 Qualifier: History of  By: Norma Fredrickson MD, Larena Glassman     . TOBACCO ABUSE, HX OF 11/08/2006    Qualifier: Diagnosis of  By: Linden Dolin MD, Marjory Lies    . ANKLE SPRAIN, RIGHT 05/10/2007    Qualifier: Diagnosis of  By: Norma Fredrickson MD, Larena Glassman      Past Surgical History  Procedure Laterality Date  . Breast surgery    . Hammer toe surgery      Right foot  . Bunionectomy      R foot    Family History  Problem Relation Age of Onset  . Diabetes Mother   . Cancer Brother     History   Social History  . Marital Status: Widowed    Spouse Name: N/A  . Number of Children: 8  . Years of Education: HS   Occupational History  . Retired    Social History Main Topics  . Smoking status: Former Smoker    Quit date: 08/02/2003  . Smokeless tobacco: Not on file  . Alcohol Use: No  . Drug Use: No  . Sexual Activity: Not on file   Other Topics Concern  . None   Social History Narrative     Current outpatient prescriptions:  .  amLODipine (NORVASC) 5 MG tablet, Take 1 tablet (5 mg total) by mouth daily., Disp: 90 tablet, Rfl: 3 .  atenolol (TENORMIN) 50 MG tablet, Take 1 tablet (50 mg total) by  mouth daily., Disp: 90 tablet, Rfl: 3 .  cetirizine (ZYRTEC) 10 MG tablet, Take 1 tablet (10 mg total) by mouth daily., Disp: 30 tablet, Rfl: 6 .  pravastatin (PRAVACHOL) 40 MG tablet, Take 1 tablet (40 mg total) by mouth daily., Disp: 90 tablet, Rfl: 3  EXAM:  Filed Vitals:   05/07/15 0848  BP: 118/70  Pulse: 75  Temp: 99 F (37.2 C)    Body mass index is 20.47 kg/(m^2).  GENERAL: vitals reviewed and listed above, alert, oriented, appears well hydrated and in no acute distress  HEENT: atraumatic, conjunttiva clear, no obvious abnormalities on inspection of external nose and ears  NECK: no obvious masses on inspection  LUNGS: clear to auscultation bilaterally, no wheezes, rales or rhonchi, good air movement  CV: HRRR, no peripheral edema  MS: moves all extremities without noticeable abnormality  PSYCH: pleasant and cooperative, no obvious depression or anxiety  ASSESSMENT AND PLAN:  Discussed the following assessment and plan:  Essential hypertension  Hyperlipemia  Memory loss  -stable -discussed asa again and she agreed to take this -follow up CPE and labs in 6 months -Patient advised to return or notify a doctor immediately if symptoms worsen or persist or new concerns  arise.  Patient Instructions  BEFORE YOU LEAVE: -MEDICARE WELLNESS VISIT in 6 months, come fasting  We recommend the following healthy lifestyle measures: - eat a healthy diet consisting of lots of vegetables, fruits, beans, nuts, seeds, healthy meats such as white chicken and fish and whole grains.  - avoid fried foods, fast food, processed foods, sodas, red meet and other fattening foods.  - get a least 150 minutes of aerobic exercise per week.       Colin Benton R.

## 2015-05-07 NOTE — Progress Notes (Signed)
Pre visit review using our clinic review tool, if applicable. No additional management support is needed unless otherwise documented below in the visit note. 

## 2015-05-07 NOTE — Patient Instructions (Signed)
BEFORE YOU LEAVE: -MEDICARE WELLNESS VISIT in 6 months, come fasting  We recommend the following healthy lifestyle measures: - eat a healthy diet consisting of lots of vegetables, fruits, beans, nuts, seeds, healthy meats such as white chicken and fish and whole grains.  - avoid fried foods, fast food, processed foods, sodas, red meet and other fattening foods.  - get a least 150 minutes of aerobic exercise per week.

## 2015-05-20 ENCOUNTER — Ambulatory Visit
Admission: RE | Admit: 2015-05-20 | Discharge: 2015-05-20 | Disposition: A | Discharge status: Home / Self Care | Payer: Medicare Other | Source: Ambulatory Visit

## 2015-05-20 DIAGNOSIS — Z1231 Encounter for screening mammogram for malignant neoplasm of breast: Secondary | ICD-10-CM

## 2015-07-18 ENCOUNTER — Observation Stay (HOSPITAL_COMMUNITY)
Admission: EM | Admit: 2015-07-18 | Discharge: 2015-07-20 | Disposition: A | Discharge status: Home / Self Care | Payer: Medicare Other | Attending: Cardiovascular Disease | Admitting: Cardiovascular Disease

## 2015-07-18 ENCOUNTER — Encounter (HOSPITAL_COMMUNITY): Payer: Self-pay | Admitting: Emergency Medicine

## 2015-07-18 ENCOUNTER — Emergency Department (HOSPITAL_COMMUNITY): Payer: Medicare Other

## 2015-07-18 DIAGNOSIS — E785 Hyperlipidemia, unspecified: Secondary | ICD-10-CM | POA: Insufficient documentation

## 2015-07-18 DIAGNOSIS — I319 Disease of pericardium, unspecified: Principal | ICD-10-CM | POA: Insufficient documentation

## 2015-07-18 DIAGNOSIS — I7100 Dissection of unspecified site of aorta: Secondary | ICD-10-CM

## 2015-07-18 DIAGNOSIS — R079 Chest pain, unspecified: Secondary | ICD-10-CM | POA: Diagnosis present

## 2015-07-18 DIAGNOSIS — M8580 Other specified disorders of bone density and structure, unspecified site: Secondary | ICD-10-CM | POA: Diagnosis not present

## 2015-07-18 DIAGNOSIS — I10 Essential (primary) hypertension: Secondary | ICD-10-CM | POA: Diagnosis not present

## 2015-07-18 DIAGNOSIS — Z8619 Personal history of other infectious and parasitic diseases: Secondary | ICD-10-CM | POA: Diagnosis not present

## 2015-07-18 DIAGNOSIS — Z87891 Personal history of nicotine dependence: Secondary | ICD-10-CM | POA: Insufficient documentation

## 2015-07-18 DIAGNOSIS — I309 Acute pericarditis, unspecified: Secondary | ICD-10-CM

## 2015-07-18 DIAGNOSIS — Z79899 Other long term (current) drug therapy: Secondary | ICD-10-CM | POA: Insufficient documentation

## 2015-07-18 DIAGNOSIS — I3139 Other pericardial effusion (noninflammatory): Secondary | ICD-10-CM

## 2015-07-18 DIAGNOSIS — Z853 Personal history of malignant neoplasm of breast: Secondary | ICD-10-CM | POA: Diagnosis not present

## 2015-07-18 DIAGNOSIS — I313 Pericardial effusion (noninflammatory): Secondary | ICD-10-CM | POA: Diagnosis present

## 2015-07-18 DIAGNOSIS — R413 Other amnesia: Secondary | ICD-10-CM | POA: Diagnosis present

## 2015-07-18 HISTORY — DX: Other pericardial effusion (noninflammatory): I31.39

## 2015-07-18 HISTORY — DX: Disease of pericardium, unspecified: I31.9

## 2015-07-18 HISTORY — DX: Pericardial effusion (noninflammatory): I31.3

## 2015-07-18 LAB — CBC
HEMATOCRIT: 36.7 % (ref 36.0–46.0)
Hemoglobin: 12 g/dL (ref 12.0–15.0)
MCH: 33 pg (ref 26.0–34.0)
MCHC: 32.7 g/dL (ref 30.0–36.0)
MCV: 100.8 fL — ABNORMAL HIGH (ref 78.0–100.0)
Platelets: 203 10*3/uL (ref 150–400)
RBC: 3.64 MIL/uL — ABNORMAL LOW (ref 3.87–5.11)
RDW: 12.4 % (ref 11.5–15.5)
WBC: 9.9 10*3/uL (ref 4.0–10.5)

## 2015-07-18 LAB — COMPREHENSIVE METABOLIC PANEL
ALK PHOS: 83 U/L (ref 38–126)
ALT: 11 U/L — AB (ref 14–54)
ANION GAP: 5 (ref 5–15)
AST: 26 U/L (ref 15–41)
Albumin: 3.9 g/dL (ref 3.5–5.0)
BILIRUBIN TOTAL: 0.9 mg/dL (ref 0.3–1.2)
BUN: 15 mg/dL (ref 6–20)
CHLORIDE: 109 mmol/L (ref 101–111)
CO2: 27 mmol/L (ref 22–32)
CREATININE: 0.88 mg/dL (ref 0.44–1.00)
Calcium: 9 mg/dL (ref 8.9–10.3)
GFR calc Af Amer: >60 mL/min (ref >60)
GFR, EST NON AFRICAN AMERICAN: 59 mL/min — AB (ref >60)
Glucose, Bld: 93 mg/dL (ref 65–99)
Potassium: 3.9 mmol/L (ref 3.5–5.1)
Sodium: 141 mmol/L (ref 135–145)
TOTAL PROTEIN: 6.8 g/dL (ref 6.5–8.1)

## 2015-07-18 LAB — APTT: aPTT: 26 seconds (ref 24–37)

## 2015-07-18 LAB — PROTIME-INR
INR: 1.13 (ref 0.00–1.49)
Prothrombin Time: 14.7 seconds (ref 11.6–15.2)

## 2015-07-18 LAB — I-STAT TROPONIN, ED
Troponin i, poc: 0 ng/mL (ref 0.00–0.08)
Troponin i, poc: 0 ng/mL (ref 0.00–0.08)
Troponin i, poc: 0 ng/mL (ref 0.00–0.08)

## 2015-07-18 LAB — TROPONIN I

## 2015-07-18 LAB — D-DIMER, QUANTITATIVE: D-Dimer, Quant: 3.21 ug/mL-FEU — ABNORMAL HIGH (ref 0.00–0.48)

## 2015-07-18 MED ORDER — COLCHICINE 0.6 MG PO TABS
0.6000 mg | ORAL_TABLET | Freq: Two times a day (BID) | ORAL | Status: DC
  Administered 2015-07-19 – 2015-07-20 (×3): 0.6 mg via ORAL
  Filled 2015-07-18 (×4): qty 1

## 2015-07-18 MED ORDER — IOHEXOL 350 MG/ML SOLN
75.0000 mL | Freq: Once | INTRAVENOUS | Status: AC | PRN
  Administered 2015-07-18: 100 mL via INTRAVENOUS

## 2015-07-18 MED ORDER — ONDANSETRON HCL 4 MG/2ML IJ SOLN: 4.0000 mg | Freq: Four times a day (QID) | INTRAMUSCULAR | Status: DC | PRN

## 2015-07-18 MED ORDER — PANTOPRAZOLE SODIUM 40 MG PO TBEC
40.0000 mg | DELAYED_RELEASE_TABLET | Freq: Every day | ORAL | Status: DC
  Administered 2015-07-19 – 2015-07-20 (×2): 40 mg via ORAL
  Filled 2015-07-18 (×3): qty 1

## 2015-07-18 MED ORDER — NITROGLYCERIN 0.4 MG SL SUBL: 0.4000 mg | SUBLINGUAL_TABLET | SUBLINGUAL | Status: DC | PRN

## 2015-07-18 MED ORDER — AMLODIPINE BESYLATE 5 MG PO TABS
5.0000 mg | ORAL_TABLET | Freq: Every day | ORAL | Status: DC
Start: 2015-07-19 — End: 2015-07-20
  Administered 2015-07-19 – 2015-07-20 (×2): 5 mg via ORAL
  Filled 2015-07-18 (×2): qty 1

## 2015-07-18 MED ORDER — IBUPROFEN 200 MG PO TABS
400.0000 mg | ORAL_TABLET | Freq: Three times a day (TID) | ORAL | Status: DC
  Administered 2015-07-19 (×2): 400 mg via ORAL
  Filled 2015-07-18 (×3): qty 2

## 2015-07-18 MED ORDER — ATENOLOL 25 MG PO TABS
50.0000 mg | ORAL_TABLET | Freq: Every day | ORAL | Status: DC
  Administered 2015-07-19 – 2015-07-20 (×2): 50 mg via ORAL
  Filled 2015-07-18 (×2): qty 2

## 2015-07-18 MED ORDER — PRAVASTATIN SODIUM 40 MG PO TABS
40.0000 mg | ORAL_TABLET | Freq: Every day | ORAL | Status: DC
  Administered 2015-07-19 – 2015-07-20 (×2): 40 mg via ORAL
  Filled 2015-07-18 (×2): qty 1

## 2015-07-18 MED ORDER — ASPIRIN 81 MG PO CHEW
324.0000 mg | CHEWABLE_TABLET | Freq: Once | ORAL | Status: AC
  Administered 2015-07-18: 324 mg via ORAL
  Filled 2015-07-18: qty 4

## 2015-07-18 MED ORDER — MORPHINE SULFATE 4 MG/ML IJ SOLN
4.0000 mg | Freq: Once | INTRAMUSCULAR | Status: AC
  Administered 2015-07-18: 4 mg via INTRAVENOUS
  Filled 2015-07-18: qty 1

## 2015-07-18 MED ORDER — HEPARIN SODIUM (PORCINE) 5000 UNIT/ML IJ SOLN
5000.0000 [IU] | Freq: Three times a day (TID) | INTRAMUSCULAR | Status: DC
  Administered 2015-07-19: 5000 [IU] via SUBCUTANEOUS
  Filled 2015-07-18 (×2): qty 1

## 2015-07-18 MED ORDER — SODIUM CHLORIDE 0.9 % IV SOLN
1000.0000 mL | INTRAVENOUS | Status: DC
  Administered 2015-07-18: 1000 mL via INTRAVENOUS

## 2015-07-18 MED ORDER — ACETAMINOPHEN 325 MG PO TABS: 650.0000 mg | ORAL_TABLET | ORAL | Status: DC | PRN

## 2015-07-18 MED ORDER — IOHEXOL 300 MG/ML  SOLN: 75.0000 mL | Freq: Once | INTRAMUSCULAR | Status: DC | PRN

## 2015-07-18 MED ORDER — LORATADINE 10 MG PO TABS
10.0000 mg | ORAL_TABLET | Freq: Every day | ORAL | Status: DC
  Administered 2015-07-19 – 2015-07-20 (×2): 10 mg via ORAL
  Filled 2015-07-18 (×2): qty 1

## 2015-07-18 NOTE — ED Notes (Signed)
Patient transported to CT 

## 2015-07-18 NOTE — H&P (Signed)
Patient ID: Pamela Choi MRN: [id number], DOB/AGE: 1930-06-05   Admit date: 07/18/2015   Primary Physician: Lucretia Kern., DO Primary Cardiologist: new  Pt. Profile:  81 year old AA female with past medical history of HTN, HLD, R infiltrating ductal carcinoma of breast s/p radiation therapy > 10 years ago, depression and memory loss presented with pleuritic chest pain since last night and found to have small to moderate pericardial fluid on CT  Problem List  Past Medical History  Diagnosis Date  . Hyperlipidemia   . Hypertension   . Allergy   . Plantar wart of right foot   . Osteopenia   . Memory loss   . Chicken pox   . CARCINOMA, INFILTRATING DUCTAL, RIGHT BREAST 11/27/2003    Annotation: grade2, ER/PR and Her2-/neu positive status post exemestane x5 years, completed March of 2010 Qualifier: History of  By: Norma Fredrickson MD, Larena Glassman     . TOBACCO ABUSE, HX OF 11/08/2006    Qualifier: Diagnosis of  By: Linden Dolin MD, Marjory Lies    . ANKLE SPRAIN, RIGHT 05/10/2007    Qualifier: Diagnosis of  By: Norma Fredrickson MD, Larena Glassman      Past Surgical History  Procedure Laterality Date  . Breast surgery    . Hammer toe surgery      Right foot  . Bunionectomy      R foot     Allergies  Allergies  Allergen Reactions  . Bee Venom Anaphylaxis    HPI  Patient is a pleasant 81 year old AA female with past medical history of HTN, HLD, R infiltrating ductal carcinoma of breast s/p radiation therapy > 10 years ago, depression and memory loss. It appears that her right breast cancer was diagnosed sometime in 2003, she has been having yearly mammogram since that time. Her last mammogram was on 05/20/2015, and there was no findings suspicious for malignancy. While one of her brother died of unknown cancer, another one died of heart attack in his 68s while shoveling snow. Patient however did not have any our cardiac history or prior cardiac workup.  She was in her usual state of health until  06/27/2015. She has been living in town house complex by herself. While her daughter does live in the same complex and occasionally help her. She has been well enough to perform most of the ADLs by herself. Her bedroom is on the second floor, she has not noticed any recent exertional symptoms when climbing stairs. Otherwise, he does not do any other strenuous activity. She denies any recent fever, chill, cough, or viral infection. She does however has occasional nasal drainage from her allergies. She denies recent lower extremity swelling, orthopnea or paroxysmal nocturnal dyspnea. On night of 07/17/2015, she was having intermittent chest discomfort with deep inspiration when she tried to lay down at night. There were no associated symptoms of diaphoresis, shortness of breath, nausea, dizziness. She continued to have the same discomfort on 07/18/2015 prompting the patient to seek medical attention at [facility] ED.  On arrival her blood pressure was 102/66.  O2 saturation 98% on room air. Heart rate in the 60 to 70s. She had normal CBC, CMP, negative troponin 2. D-dimer was elevated at 3.21. Chest x-ray was negative for acute process. EKG showed T-wave inversion in inferior lead and V3 through V6. Given elevated d-dimer, a CTA of the chest was obtained which showed pericardial effusion small to moderate size, measuring up to 1.3 cm thickness. No PE was seen. Lungs otherwise clear. Cardiology has  been consulted for pleuritic chest pain and pericardial effusion.    Home Medications  Prior to Admission medications   Medication Sig Start Date End Date Taking? Authorizing Provider  amLODipine (NORVASC) 5 MG tablet Take 1 tablet (5 mg total) by mouth daily. 11/25/14  Yes Lucretia Kern, DO  atenolol (TENORMIN) 50 MG tablet Take 1 tablet (50 mg total) by mouth daily. 11/25/14  Yes Lucretia Kern, DO  cetirizine (ZYRTEC) 10 MG tablet Take 1 tablet (10 mg total) by mouth daily. 08/14/12  Yes Pedro Earls, MD    pravastatin (PRAVACHOL) 40 MG tablet Take 1 tablet (40 mg total) by mouth daily. 11/25/14  Yes Lucretia Kern, DO    Family History  Family History  Problem Relation Age of Onset  . Diabetes Mother   . Cancer Brother   . Heart attack Brother     Social History  History   Social History  . Marital Status: Widowed    Spouse Name: N/A  . Number of Children: 8  . Years of Education: HS   Occupational History  . Retired    Social History Main Topics  . Smoking status: Former Smoker    Quit date: 08/02/2003  . Smokeless tobacco: Not on file  . Alcohol Use: No  . Drug Use: No  . Sexual Activity: Not on file   Other Topics Concern  . Not on file   Social History Narrative     Review of Systems General:  No chills, fever, night sweats or weight changes.  Cardiovascular:  No dyspnea on exertion, edema, orthopnea, palpitations, paroxysmal nocturnal dyspnea. +pleuritic chest pain on deep inspiration only Dermatological: No rash, lesions/masses Respiratory: No cough, dyspnea Urologic: No hematuria, dysuria Abdominal:   No nausea, vomiting, diarrhea, bright red blood per rectum, melena, or hematemesis Neurologic:  No visual changes, wkns, changes in mental status. All other systems reviewed and are otherwise negative except as noted above.  Physical Exam  Blood pressure 110/61, pulse 66, temperature 98 F (36.7 C), temperature source Oral, resp. rate 28, height _0  (1.676 m), weight 121 lb (54.885 kg), SpO2 98 %.  General: Pleasant, NAD Psych: Normal affect. Neuro: Alert and oriented X 3. Moves all extremities spontaneously. HEENT: Normal  Neck: Supple without bruits or JVD. Lungs:  Resp regular and unlabored, CTA. Heart: RRR no s3, s4, or murmurs. No obvious murmur or rub Abdomen: Soft, non-tender, non-distended, BS + x 4.  Extremities: No clubbing, cyanosis or edema. DP/PT/Radials 2+ and equal bilaterally.  Labs  Troponin [facility] of Care Test)  Recent Labs   07/18/15 1337  TROPIPOC 0.00   No results for input(s): CKTOTAL, CKMB, TROPONINI in the last 72 hours. Lab Results  Component Value Date   WBC 9.9 07/18/2015   HGB 12.0 07/18/2015   HCT 36.7 07/18/2015   MCV 100.8* 07/18/2015   PLT 203 07/18/2015    Recent Labs Lab 07/18/15 1135  NA 141  K 3.9  CL 109  CO2 27  BUN 15  CREATININE 0.88  CALCIUM 9.0  PROT 6.8  BILITOT 0.9  ALKPHOS 83  ALT 11*  AST 26  GLUCOSE 93   Lab Results  Component Value Date   CHOL 178 11/25/2014   HDL 52.70 11/25/2014   LDLCALC 117* 11/25/2014   TRIG 44.0 11/25/2014   Lab Results  Component Value Date   DDIMER 3.21* 07/18/2015     Radiology/Studies  Dg Chest 2 View  07/18/2015   CLINICAL DATA:  Acute chest pain.  EXAM: CHEST  2 VIEW  COMPARISON:  None.  FINDINGS: Mild cardiomegaly is noted. No pneumothorax or pleural effusion is noted. No acute pulmonary disease is noted. Bony thorax is intact.  IMPRESSION: No active cardiopulmonary disease.   Electronically Signed   By: Marijo Conception, M.D.   On: 07/18/2015 10:52   Ct Angio Chest Pe W/cm &/or Wo Cm  07/18/2015   CLINICAL DATA:  Chest pain with inspiration.  Elevated D-dimer.  EXAM: CT ANGIOGRAPHY CHEST WITH CONTRAST  TECHNIQUE: Multidetector CT imaging of the chest was performed using the standard protocol during bolus administration of intravenous contrast. Multiplanar CT image reconstructions and MIPs were obtained to evaluate the vascular anatomy.  CONTRAST:  127m OMNIPAQUE IOHEXOL 350 MG/ML SOLN  COMPARISON:  None.  FINDINGS: There is no pulmonary embolism identified within the main, lobar, or central segmental pulmonary arteries. Some of the most peripheral segmental and subsegmental pulmonary arteries are difficult to characterize due to mild patient breathing motion artifact.  Heart size is upper normal. There is a pericardial effusion, small to moderate in size, measuring up to 1.3 cm greatest thickness. Thoracic aorta is normal in  caliber. No aortic aneurysm or dissection. No mass or enlarged lymph nodes seen within the mediastinum or perihilar regions.  There is mild dependent atelectasis at each lung base. Lungs otherwise clear. Mild emphysematous change noted within the upper lobes bilaterally. No evidence of pneumonia. No pleural effusion. No pneumothorax. Trachea and central bronchi are unremarkable.  Scattered mild degenerative changes are seen throughout the slightly scoliotic thoracic spine. No acute osseous abnormality. Limited images of the upper abdomen are unremarkable.  Review of the MIP images confirms the above findings.  IMPRESSION: 1. Pericardial effusion, small to moderate in size, measuring up to 1.3 cm thickness. 2. No pulmonary embolism seen, with mild study limitations detailed above. 3. Mild bibasilar atelectasis.  Lungs otherwise clear. 4. Mild emphysematous change within the upper lobes bilaterally.  These results were called by telephone at the time of interpretation on 07/18/2015 at 2:39 pm to Dr. JDorie Rank, who verbally acknowledged these results.   Electronically Signed   By: SFranki CabotM.D.   On: 07/18/2015 14:39    ECG  NSR with TWI in inferior lead and V3-V6  ASSESSMENT AND PLAN  1. Pleuritic chest pain with small to moderate pericardial effusion on CTA  - unclear etiology: she denies any recent viral infection. Her R breast CA has not recurred, recent negative mammogram in May 2016, denies FHx of lupus. No q wave on EKG or elevated trop to indicate recent major MI. HTN well controlled  - start colchicine and motrin. Obtain echo to further assess LV function and size of pericardial fluid to r/o temponade. Cannot r/o malignancy without tap, however would like to avoid invasive procedure unless has definitive sign of recurrent CA. Will obtain outpatient echo in office in 1 month, if larger, will need to be taped for pathology  - with TWI in inferior lead and V3-V6, may benefit from outpatient  myoview at some point, but given atypical nature of her CP and negative trop, unlikely needed during this admission  2. HTN 3. HLD 4. R infiltrating ductal carcinoma of breast s/p radiation therapy > 10 years ago 5. Depression 6. memory loss  Signed, MAlmyra Deforest PVermont7/22/2016, 4:19 PM   .Attending Note:   The patient was seen and examined.  Agree with assessment and plan as noted above.  Changes  made to the above note as needed.  1. Acute pericarditis:  Her symptoms are c/w pericarditis. D-dimer was elevted but CT angiogram was negative for PE. She was found to have a small - moderate pericardial effusion  Will admit for obs.  She is not acutely ill be she has trouble telling me about how she feels because of her dementia and I think it would be safer for her to be here overnight  Get an echo in the AM Start colchicine and motrin.   I anticipate DC in the AM. I'll plan on seeing her in the office ain about a month for office and follow up echo  If the effusion has resolved, will DC colchicine and give motrin as needed. If the effusion has grown, we will need to tap it or do a window to look for recurrent breast cancer   Thayer Headings, Brooke Bonito., MD, [facility] 07/18/2015, 4:52 PM [address],  [place] Pager [phone]

## 2015-07-18 NOTE — ED Provider Notes (Signed)
CSN: [id number]     Arrival date & time 07/18/15  1000 History   First MD Initiated Contact with Patient 07/18/15 1006     Chief Complaint  Patient presents with  . Chest Pain    Patient is a 81 y.o. female presenting with chest pain. The history is provided by the patient.  Chest Pain Pain location:  Substernal area Pain quality comment:  "it is a pain" Pain radiates to:  Does not radiate Pain severity:  Moderate Onset quality: pt noticed it when she woke up this am, last night she was feeling fine. Timing:  Intermittent (only when she breathes) Chronicity:  New Context: breathing   Relieved by:  Nothing Worsened by:  Deep breathing Ineffective treatments:  None tried Associated symptoms: cough   Associated symptoms: no fever, no lower extremity edema and no shortness of breath   Risk factors: no coronary artery disease, no prior DVT/PE and no smoking     Past Medical History  Diagnosis Date  . Hyperlipidemia   . Hypertension   . Allergy   . Plantar wart of right foot   . Osteopenia   . Memory loss   . Chicken pox   . CARCINOMA, INFILTRATING DUCTAL, RIGHT BREAST 11/27/2003    Annotation: grade2, ER/PR and Her2-/neu positive status post exemestane x5 years, completed March of 2010 Qualifier: History of  By: Norma Fredrickson MD, Larena Glassman     . TOBACCO ABUSE, HX OF 11/08/2006    Qualifier: Diagnosis of  By: Linden Dolin MD, Marjory Lies    . ANKLE SPRAIN, RIGHT 05/10/2007    Qualifier: Diagnosis of  By: Norma Fredrickson MD, Larena Glassman     Past Surgical History  Procedure Laterality Date  . Breast surgery    . Hammer toe surgery      Right foot  . Bunionectomy      R foot   Family History  Problem Relation Age of Onset  . Diabetes Mother   . Cancer Brother    History  Substance Use Topics  . Smoking status: Former Smoker    Quit date: 08/02/2003  . Smokeless tobacco: Not on file  . Alcohol Use: No   OB History    No data available     Review of Systems  Constitutional: Negative for  fever.  Respiratory: Positive for cough. Negative for shortness of breath.   Cardiovascular: Positive for chest pain.  All other systems reviewed and are negative.     Allergies  Bee venom  Home Medications   Prior to Admission medications   Medication Sig Start Date End Date Taking? Authorizing Provider  amLODipine (NORVASC) 5 MG tablet Take 1 tablet (5 mg total) by mouth daily. 11/25/14  Yes Lucretia Kern, DO  atenolol (TENORMIN) 50 MG tablet Take 1 tablet (50 mg total) by mouth daily. 11/25/14  Yes Lucretia Kern, DO  cetirizine (ZYRTEC) 10 MG tablet Take 1 tablet (10 mg total) by mouth daily. 08/14/12  Yes Pedro Earls, MD  pravastatin (PRAVACHOL) 40 MG tablet Take 1 tablet (40 mg total) by mouth daily. 11/25/14  Yes Lucretia Kern, DO   BP 113/64 mmHg  Pulse 65  Temp(Src) 98 F (36.7 C) (Oral)  Resp 20  Ht _0  (1.676 m)  Wt 121 lb (54.885 kg)  BMI 19.54 kg/m2  SpO2 97% Physical Exam  Constitutional: No distress.  HENT:  Head: Normocephalic and atraumatic.  Right Ear: External ear normal.  Left Ear: External ear normal.  Eyes: Conjunctivae are normal.  Right eye exhibits no discharge. Left eye exhibits no discharge. No scleral icterus.  Neck: Neck supple. No tracheal deviation present.  Cardiovascular: Normal rate, regular rhythm and intact distal pulses.   Pulmonary/Chest: Effort normal and breath sounds normal. No stridor. No respiratory distress. She has no wheezes. She has no rales.  Abdominal: Soft. Bowel sounds are normal. She exhibits no distension. There is no tenderness. There is no rebound and no guarding.  Musculoskeletal: She exhibits no edema or tenderness.  Neurological: She is alert. She has normal strength. No cranial nerve deficit (no facial droop, extraocular movements intact, no slurred speech) or sensory deficit. She exhibits normal muscle tone. She displays no seizure activity. Coordination normal.  Skin: Skin is warm and dry. No rash noted. She is not  diaphoretic.  Psychiatric: She has a normal mood and affect.  Nursing note and vitals reviewed.   ED Course  Procedures (including critical care time) Labs Review Labs Reviewed  CBC - Abnormal; Notable for the following:    RBC 3.64 (*)    MCV 100.8 (*)    All other components within normal limits  COMPREHENSIVE METABOLIC PANEL - Abnormal; Notable for the following:    ALT 11 (*)    GFR calc non Af Amer 59 (*)    All other components within normal limits  D-DIMER, QUANTITATIVE (NOT AT [facility]) - Abnormal; Notable for the following:    D-Dimer, Quant 3.21 (*)    All other components within normal limits  APTT  PROTIME-INR  I-STAT TROPOININ, ED  Randolm Idol, ED    Imaging Review Dg Chest 2 View  07/18/2015   CLINICAL DATA:  Acute chest pain.  EXAM: CHEST  2 VIEW  COMPARISON:  None.  FINDINGS: Mild cardiomegaly is noted. No pneumothorax or pleural effusion is noted. No acute pulmonary disease is noted. Bony thorax is intact.  IMPRESSION: No active cardiopulmonary disease.   Electronically Signed   By: Marijo Conception, M.D.   On: 07/18/2015 10:52   Ct Angio Chest Pe W/cm &/or Wo Cm  07/18/2015   CLINICAL DATA:  Chest pain with inspiration.  Elevated D-dimer.  EXAM: CT ANGIOGRAPHY CHEST WITH CONTRAST  TECHNIQUE: Multidetector CT imaging of the chest was performed using the standard protocol during bolus administration of intravenous contrast. Multiplanar CT image reconstructions and MIPs were obtained to evaluate the vascular anatomy.  CONTRAST:  168m OMNIPAQUE IOHEXOL 350 MG/ML SOLN  COMPARISON:  None.  FINDINGS: There is no pulmonary embolism identified within the main, lobar, or central segmental pulmonary arteries. Some of the most peripheral segmental and subsegmental pulmonary arteries are difficult to characterize due to mild patient breathing motion artifact.  Heart size is upper normal. There is a pericardial effusion, small to moderate in size, measuring up to 1.3 cm greatest  thickness. Thoracic aorta is normal in caliber. No aortic aneurysm or dissection. No mass or enlarged lymph nodes seen within the mediastinum or perihilar regions.  There is mild dependent atelectasis at each lung base. Lungs otherwise clear. Mild emphysematous change noted within the upper lobes bilaterally. No evidence of pneumonia. No pleural effusion. No pneumothorax. Trachea and central bronchi are unremarkable.  Scattered mild degenerative changes are seen throughout the slightly scoliotic thoracic spine. No acute osseous abnormality. Limited images of the upper abdomen are unremarkable.  Review of the MIP images confirms the above findings.  IMPRESSION: 1. Pericardial effusion, small to moderate in size, measuring up to 1.3 cm thickness. 2. No pulmonary embolism seen, with  mild study limitations detailed above. 3. Mild bibasilar atelectasis.  Lungs otherwise clear. 4. Mild emphysematous change within the upper lobes bilaterally.  These results were called by telephone at the time of interpretation on 07/18/2015 at 2:39 pm to Dr. Dorie Rank , who verbally acknowledged these results.   Electronically Signed   By: Franki Cabot M.D.   On: 07/18/2015 14:39     EKG Interpretation   Date/Time:  Friday July 18 2015 10:06:14 EDT Ventricular Rate:  71 PR Interval:  208 QRS Duration: 68 QT Interval:  400 QTC Calculation: 434 R Axis:   -14 Text Interpretation:  Normal sinus rhythm T wave abnormality, consider  inferolateral ischemia Abnormal ECG lateral t wave changes increased from  last tracing Confirmed by Abdiel Blackerby  MD-J, Rhapsody Wolven ([id number]) on 07/18/2015 10:15:57  AM      MDM   Final diagnoses:  Pericardial effusion   The patient's CT scan shows a pericardial effusion. She does not have evidence of aneurysm or pulmonary embolism.  This may be the source of the patient's chest pain. I doubt acute cardiac ischemia. She does not have any evidence of tamponade physiology. I will consult with the cardiology  service for further treatment.    Dorie Rank, MD 07/18/15 [phone]

## 2015-07-18 NOTE — ED Notes (Signed)
Cardiology at bedside.

## 2015-07-18 NOTE — ED Notes (Signed)
Pt. Stated, i started having chest pain when I breath this morning. I only have it when I breath. Pt. Denies any other symptoms.

## 2015-07-18 NOTE — ED Notes (Signed)
Ordered diet tray 

## 2015-07-19 ENCOUNTER — Observation Stay (HOSPITAL_COMMUNITY): Payer: Medicare Other

## 2015-07-19 DIAGNOSIS — I313 Pericardial effusion (noninflammatory): Secondary | ICD-10-CM | POA: Diagnosis not present

## 2015-07-19 LAB — BASIC METABOLIC PANEL
ANION GAP: 9 (ref 5–15)
BUN: 13 mg/dL (ref 6–20)
CALCIUM: 8.3 mg/dL — AB (ref 8.9–10.3)
CHLORIDE: 103 mmol/L (ref 101–111)
CO2: 23 mmol/L (ref 22–32)
Creatinine, Ser: 0.86 mg/dL (ref 0.44–1.00)
GFR calc non Af Amer: >60 mL/min (ref >60)
Glucose, Bld: 108 mg/dL — ABNORMAL HIGH (ref 65–99)
Potassium: 3.7 mmol/L (ref 3.5–5.1)
SODIUM: 135 mmol/L (ref 135–145)

## 2015-07-19 LAB — TROPONIN I

## 2015-07-19 LAB — TSH: TSH: 2.526 u[IU]/mL (ref 0.350–4.500)

## 2015-07-19 MED ORDER — ATENOLOL 25 MG PO TABS
25.0000 mg | ORAL_TABLET | Freq: Once | ORAL | Status: AC
  Administered 2015-07-19: 25 mg via ORAL
  Filled 2015-07-19: qty 1

## 2015-07-19 MED ORDER — IOHEXOL 350 MG/ML SOLN
100.0000 mL | Freq: Once | INTRAVENOUS | Status: AC | PRN
  Administered 2015-07-19: 100 mL via INTRAVENOUS

## 2015-07-19 NOTE — Progress Notes (Signed)
Subjective:  Patient is oriented to the hospital today and denies symptoms.  No chest pain or shortness of breath today.  Objective:  Vital Signs in the last 24 hours: BP 102/55 mmHg  Pulse 79  Temp(Src) 98.7 F (37.1 C) (Oral)  Resp 17  Ht 5\' 6"  (1.676 m)  Wt 55.112 kg (121 lb 8 oz)  BMI 19.62 kg/m2  SpO2 93%  Physical Exam: Elderly black female appears younger than stated age Lungs:  Clear  Cardiac:  Regular rhythm, normal S1 and S2, no S3, 1/6 diastolic murmur heard at left sternal border Extremities:  No edema present  Intake/Output from previous day:   Weight Filed Weights   07/18/15 1007 07/18/15 1845  Weight: 54.885 kg (121 lb) 55.112 kg (121 lb 8 oz)    Lab Results: Basic Metabolic Panel:  Recent Labs  07/18/15 1135 07/19/15 0630  NA 141 135  K 3.9 3.7  CL 109 103  CO2 27 23  GLUCOSE 93 108*  BUN 15 13  CREATININE 0.88 0.86    CBC:  Recent Labs  07/18/15 1135  WBC 9.9  HGB 12.0  HCT 36.7  MCV 100.8*  PLT 203   Telemetry: Sinus rhythm noted  Echo: EF is around 50-55%.  The ascending aorta is dilated at 4 cm there is a question about whether there is a disruption of the intima.  She has mild to moderate aortic regurgitation.  There is a moderate pericardial effusion noted with very early RA collapse but no other evidence of tamponade.  There is respiratory variation of tricuspid signal.  Assessment/Plan:  1.  Moderate pericardial effusion without tamponade 2.  Dilated aorta-reviewed the CT scan done last night with the radiologist and cannot exclude a problem with the proximal thoracic aorta 3.  Mild-to-moderate aortic regurgitation 4.  Hypertensive heart disease  Recommendations:  We will get a gated CTA of the thoracic aorta to look for evidence of any dissection.  Keep in the hospital today.  Go ahead and start colchicine and nostril anti-inflammatory agents.     Kerry Hough  MD [facility] Cardiology  07/19/2015, 11:18  AM

## 2015-07-19 NOTE — Progress Notes (Signed)
Echocardiogram 2D Echocardiogram has been performed.  Pamela Choi 07/19/2015, 10:42 AM

## 2015-07-20 ENCOUNTER — Other Ambulatory Visit: Payer: Self-pay | Admitting: Nurse Practitioner

## 2015-07-20 ENCOUNTER — Encounter (HOSPITAL_COMMUNITY): Payer: Self-pay | Admitting: Nurse Practitioner

## 2015-07-20 DIAGNOSIS — I309 Acute pericarditis, unspecified: Secondary | ICD-10-CM

## 2015-07-20 DIAGNOSIS — I3139 Other pericardial effusion (noninflammatory): Secondary | ICD-10-CM

## 2015-07-20 DIAGNOSIS — E785 Hyperlipidemia, unspecified: Secondary | ICD-10-CM | POA: Diagnosis not present

## 2015-07-20 DIAGNOSIS — M8580 Other specified disorders of bone density and structure, unspecified site: Secondary | ICD-10-CM | POA: Diagnosis not present

## 2015-07-20 DIAGNOSIS — I10 Essential (primary) hypertension: Secondary | ICD-10-CM | POA: Diagnosis not present

## 2015-07-20 DIAGNOSIS — I313 Pericardial effusion (noninflammatory): Secondary | ICD-10-CM

## 2015-07-20 DIAGNOSIS — I319 Disease of pericardium, unspecified: Secondary | ICD-10-CM | POA: Diagnosis not present

## 2015-07-20 HISTORY — DX: Acute pericarditis, unspecified: I30.9

## 2015-07-20 MED ORDER — COLCHICINE 0.6 MG PO TABS: 0.6000 mg | ORAL_TABLET | Freq: Two times a day (BID) | ORAL | Status: DC

## 2015-07-20 MED ORDER — PANTOPRAZOLE SODIUM 40 MG PO TBEC: 40.0000 mg | DELAYED_RELEASE_TABLET | Freq: Every day | ORAL | Status: DC

## 2015-07-20 MED ORDER — IBUPROFEN 400 MG PO TABS: 400.0000 mg | ORAL_TABLET | Freq: Three times a day (TID) | ORAL | Status: DC

## 2015-07-20 NOTE — Discharge Summary (Signed)
Discharge Summary   Patient ID: Pamela Choi,  MRN: [id number], DOB/AGE: 1930/09/27 81 y.o.  Admit date: 07/18/2015 Discharge date: 07/20/2015  Primary Care Provider: Lucretia Kern Primary Cardiologist: Joaquim Nam, MD   Discharge Diagnoses Principal Problem:   Acute pericarditis Active Problems:   Pericardial effusion  **Small to moderate by echo this admission.   Essential hypertension   Hyperlipemia   Memory loss   Mildly dilated Ascending Aorta  Allergies Allergies  Allergen Reactions  . Bee Venom Anaphylaxis    Procedures  CT Angio of the Chest with Contrast  7.22.2016  IMPRESSION: 1. Pericardial effusion, small to moderate in size, measuring up to 1.3 cm thickness. 2. No pulmonary embolism seen, with mild study limitations detailed above. 3. Mild bibasilar atelectasis.  Lungs otherwise clear. 4. Mild emphysematous change within the upper lobes bilaterally _____________   2D Echocardiogram 7.23.2016  Study Conclusions  - Left ventricle: The cavity size was normal. Wall thickness was   increased in a pattern of mild LVH. The estimated ejection   fraction was 55%. Wall motion was normal; there were no regional   wall motion abnormalities. - Aortic valve: There was mild regurgitation. - Aorta: Mild dilitaion of ascending aorta at 2mm. Aortic root   dimension: 35 mm (ED). - Right ventricle: The cavity size was normal. Systolic function   was normal. - Tricuspid valve: There was mild regurgitation. - Pericardium, extracardiac: A small to moderate pericardial   effusion was identified circumferential to the heart. There was   no evidence of hemodynamic compromise. _____________   CT Angio of the Chest/Aorta 7.23.2016  IMPRESSION: Stable dilatation of the ascending aorta. No dissection is identified.   Pericardial effusion.   Mild dependent atelectatic changes.   Dilatation of the common bile duct a 15 mm. This is incompletely evaluated. This  may be related to the patient's age as the bilirubin is not significantly elevated. _____________   History of Present Illness  81 y/o female with a h/o HTN, HL, memory loss, and remote right breast cancer, who was in her usual state of health until 07/17/2015, when she began to experience intermittent pleuritic chest discomfort that was worse with deep breathing and lying down.  There were no associated symptoms.  Due to ongoing symptoms, she presented to the [facility] ED on 7/22, where she was hemodynamically stable.  ECG was notable for inferior and anterolateral T wave inversion.  Troponin was normal.  D dibmer was elevated @ 3.21.  CTA of the chest was performed and was negative for PE but did show a small to moderate pericardial effusion.  Cardiology was consulted and Pamela Choi was admitted for further evaluation and management of pericarditis.  Hospital Course  Following admission, troponins remained normal.  She was placed on colchicine, nsaid, and PPI therapy with improvement in chest pain symptoms.  A 2D echocardiogram was performed on 7/23, and showed normal LV function with a mild to moderate circumferential pericardial effusion.  There was question of a possible disruption of the intima of the ascending aorta in the setting of mild dilatation, measuring 4 cm.  Previous CTA of the chest was reviewed with radiology and recommendation was made for CTA of the chest/Aorta.  This was performed on 7/22 and did not show any evidence of aortic dissection.  Ms. Vejar does have a h/o memory deficits and awoke confused this morning.  She has otherwise been stable.  She will be discharged home today in good condition.  We will  arrange for f/u later this week with plan to repeat echo and re-evaluate her pericardial effusion at that time.  Discharge Vitals Blood pressure 107/65, pulse 70, temperature 98.3 F (36.8 C), temperature source Oral, resp. rate 18, height 5\' 6"  (1.676 m), weight 121 lb 8 oz  (55.112 kg), SpO2 95 %.  Filed Weights   07/18/15 1007 07/18/15 1845  Weight: 121 lb (54.885 kg) 121 lb 8 oz (55.112 kg)    Labs  CBC  Recent Labs  07/18/15 1135  WBC 9.9  HGB 12.0  HCT 36.7  MCV 100.8*  PLT 563   Basic Metabolic Panel  Recent Labs  07/18/15 1135 07/19/15 0630  NA 141 135  K 3.9 3.7  CL 109 103  CO2 27 23  GLUCOSE 93 108*  BUN 15 13  CREATININE 0.88 0.86  CALCIUM 9.0 8.3*   Liver Function Tests  Recent Labs  07/18/15 1135  AST 26  ALT 11*  ALKPHOS 83  BILITOT 0.9  PROT 6.8  ALBUMIN 3.9   Cardiac Enzymes  Recent Labs  07/18/15 1849 07/19/15 0021 07/19/15 0630  TROPONINI <0.03 <0.03 <0.03   BNP Invalid input(s): POCBNP D-Dimer  Recent Labs  07/18/15 1135  DDIMER 3.21*   Thyroid Function Tests  Recent Labs  07/19/15 0420  TSH 2.526   Disposition  Pt is being discharged home today in good condition.  Follow-up Plans & Appointments      Follow-up Information    Follow up with Colin Benton R., DO.   Specialty:  Family Medicine   Why:  as scheduled.   Contact information:   [place] [address] [phone]       Follow up with [place].   Why:  We will arrange for f/u this week and contact you. We will also repeat an echocardiogram (heart ultrasound) at this visit.   Contact information:   [place] [id number] [phone]     Discharge Medications    Medication List    TAKE these medications        amLODipine 5 MG tablet  Commonly known as:  NORVASC  Take 1 tablet (5 mg total) by mouth daily.     atenolol 50 MG tablet  Commonly known as:  TENORMIN  Take 1 tablet (50 mg total) by mouth daily.     cetirizine 10 MG tablet  Commonly known as:  ZYRTEC  Take 1 tablet (10 mg total) by mouth daily.     colchicine 0.6 MG tablet  Take 1 tablet (0.6 mg total) by mouth 2 (two) times daily.      ibuprofen 400 MG tablet  Commonly known as:  ADVIL,MOTRIN  Take 1 tablet (400 mg total) by mouth every 8 (eight) hours.     pantoprazole 40 MG tablet  Commonly known as:  PROTONIX  Take 1 tablet (40 mg total) by mouth daily.     pravastatin 40 MG tablet  Commonly known as:  PRAVACHOL  Take 1 tablet (40 mg total) by mouth daily.       Outstanding Labs/Studies  Limited echo @ f/u appt this week.  Duration of Discharge Encounter   Greater than 30 minutes including physician time.  Signed, Murray Hodgkins NP 07/20/2015, 8:54 AM

## 2015-07-20 NOTE — Progress Notes (Signed)
Subjective:  No recurrent chest pain or shortness of breath overnight.  She is upset and nervous this morning because she woke up and was not quite sure where she was or if she had had her medication.  I spoke to her long time at the bedside and she is feeling better today.  She had a CTA yesterday that did not show a dissection.  Objective:  Vital Signs in the last 24 hours: BP 107/65 mmHg  Pulse 70  Temp(Src) 98.3 F (36.8 C) (Oral)  Resp 18  Ht 5\' 6"  (1.676 m)  Wt 55.112 kg (121 lb 8 oz)  BMI 19.62 kg/m2  SpO2 95%  Physical Exam: Elderly black female appears younger than stated age Lungs:  Clear  Cardiac:  Regular rhythm, normal S1 and S2, no S3, 1/6 diastolic murmur heard at left sternal border Extremities:  No edema present  Intake/Output from previous day: 07/23 0701 - 07/24 0700 In: 480 [P.O.:480] Out: -  Weight Filed Weights   07/18/15 1007 07/18/15 1845  Weight: 54.885 kg (121 lb) 55.112 kg (121 lb 8 oz)    Lab Results: Basic Metabolic Panel:  Recent Labs  07/18/15 1135 07/19/15 0630  NA 141 135  K 3.9 3.7  CL 109 103  CO2 27 23  GLUCOSE 93 108*  BUN 15 13  CREATININE 0.88 0.86    CBC:  Recent Labs  07/18/15 1135  WBC 9.9  HGB 12.0  HCT 36.7  MCV 100.8*  PLT 203   Telemetry: Sinus rhythm noted  Echo: EF is around 50-55%.  The ascending aorta is dilated at 4 cm there is a question about whether there is a disruption of the intima.  She has mild to moderate aortic regurgitation.  There is a moderate pericardial effusion noted with very early RA collapse but no other evidence of tamponade.  There is respiratory variation of tricuspid signal.  Assessment/Plan:  1.  Moderate pericardial effusion without tamponade 2.  Dilated aorta-reviewed the CTA done yesterday that did not show dissection  3.  Mild-to-moderate aortic regurgitation 4.  Hypertensive heart disease  Recommendations:  I think she is stable for discharge today.  Blood pressure  is stable and there are no hemodynamic issues.  She should be discharged on colchicine and nostril and 10 inflammatory such as ibuprofen.  She will need follow-up in one week with an echo done at that time to assess the stability of the effusion.     Kerry Hough  MD [facility]  07/20/2015, 8:18 AM

## 2015-07-20 NOTE — Discharge Instructions (Signed)
***  PLEASE REMEMBER TO BRING ALL OF YOUR MEDICATIONS TO EACH OF YOUR FOLLOW-UP OFFICE VISITS.  

## 2015-07-20 NOTE — Progress Notes (Signed)
Pt discharged home with son and daughter.  Reviewed discharge instruction and information.  All questions answered and assessment unchanged from earlier.

## 2015-07-21 ENCOUNTER — Telehealth: Payer: Self-pay | Admitting: Nurse Practitioner

## 2015-07-21 ENCOUNTER — Ambulatory Visit (INDEPENDENT_AMBULATORY_CARE_PROVIDER_SITE_OTHER): Payer: Medicare Other | Admitting: Podiatry

## 2015-07-21 DIAGNOSIS — M79673 Pain in unspecified foot: Secondary | ICD-10-CM

## 2015-07-21 DIAGNOSIS — L84 Corns and callosities: Secondary | ICD-10-CM

## 2015-07-21 DIAGNOSIS — B351 Tinea unguium: Secondary | ICD-10-CM | POA: Diagnosis not present

## 2015-07-21 NOTE — Telephone Encounter (Signed)
Patient contacted regarding discharge from [facility]  on 07/20/15.  Patient understands to follow up with provider Lucillie Garfinkel on 07/25/15 at 11:00  at [address] office.. Patient understands discharge instructions? yes Patient understands medications and regiment? yes Patient understands to bring all medications to this visit? yes  Daughter in law, Pamela Choi, called back.  She takes care of Pamela Choi.  States she had a good night. Ate good breakfast this AM. No c/o of being SOB. Reviewed medications and answered all questions.  Aware of appointment on Fri 7/29 and to bring her medications.

## 2015-07-21 NOTE — Progress Notes (Signed)
Patient ID: Francisca December, female   DOB: 03/18/1930, 81 y.o.   MRN: [id number]  Subjective: 81 y.o. returns the office today for painful, elongated, thickened toenails which she cannot trim herself. Denies any redness or drainage around the nails. Also has painful calluses to the right 2nd toe and the right ball of the foot. Denies any redness or drainage from these sites. Denies any acute changes since last appointment and no new complaints today. Denies any systemic complaints such as fevers, chills, nausea, vomiting.   Objective: AAO 3, NAD DP/PT pulses palpable, CRT less than 3 seconds Nails hypertrophic, dystrophic, elongated, brittle, discolored 10. There is tenderness overlying the nails 1-5 bilatrally. There is no surrounding erythema or drainage along the nail sites. Right medial 2nd digit thick hyperkerotic lesion. Also a hyperkerotic lesion is present submetatarsal 3 on the right. Upon debridement, no underlying ulceration, drainage, or other signs of infection.  No other open lesions or pre-ulcerative lesions are identified. No other areas of tenderness bilateral lower extremities. No overlying edema, erythema, increased warmth. Hammertoe contractures present.  No pain with calf compression, swelling, warmth, erythema.  Assessment: Patient presents with symptomatic onychomycosis; hyperkerotic lesions   Plan: -Treatment options including alternatives, risks, complications were discussed -Nails sharply debrided 10 without complication/bleeding. -Hyperkerotic lesions sharply debrided x 2 without complications/bleeding  -Discussed daily foot inspection. If there are any changes, to call the office immediately.  -Follow-up in 3 months or sooner if any problems are to arise. In the meantime, encouraged to call the office with any questions, concerns, changes symptoms.  Celesta Gentile, DPM

## 2015-07-21 NOTE — Telephone Encounter (Signed)
NEw Message   7 day TOC w/ Chris- 7/29 @ 11am

## 2015-07-21 NOTE — Telephone Encounter (Signed)
Lm to call back

## 2015-07-25 ENCOUNTER — Other Ambulatory Visit: Payer: Self-pay

## 2015-07-25 ENCOUNTER — Ambulatory Visit (INDEPENDENT_AMBULATORY_CARE_PROVIDER_SITE_OTHER): Payer: Medicare Other | Admitting: Nurse Practitioner

## 2015-07-25 ENCOUNTER — Ambulatory Visit (HOSPITAL_COMMUNITY): Payer: Medicare Other | Attending: Cardiology

## 2015-07-25 ENCOUNTER — Encounter: Payer: Self-pay | Admitting: Nurse Practitioner

## 2015-07-25 ENCOUNTER — Telehealth: Payer: Self-pay

## 2015-07-25 VITALS — BP 100/60 | HR 75 | Ht 66.0 in | Wt 125.0 lb

## 2015-07-25 DIAGNOSIS — E785 Hyperlipidemia, unspecified: Secondary | ICD-10-CM

## 2015-07-25 DIAGNOSIS — I319 Disease of pericardium, unspecified: Secondary | ICD-10-CM

## 2015-07-25 DIAGNOSIS — I309 Acute pericarditis, unspecified: Secondary | ICD-10-CM

## 2015-07-25 DIAGNOSIS — I313 Pericardial effusion (noninflammatory): Secondary | ICD-10-CM

## 2015-07-25 DIAGNOSIS — Z87891 Personal history of nicotine dependence: Secondary | ICD-10-CM | POA: Insufficient documentation

## 2015-07-25 DIAGNOSIS — I3139 Other pericardial effusion (noninflammatory): Secondary | ICD-10-CM

## 2015-07-25 DIAGNOSIS — I10 Essential (primary) hypertension: Secondary | ICD-10-CM

## 2015-07-25 NOTE — Telephone Encounter (Signed)
Called patient's daughter-n-law (DPR) about echo results. Per Ignacia Bayley NP, Effusion is slightly larger than it was prior to discharge last week. Continue ibuprofen, colchicine, and protonix as we discussed. She should have a repeat limited echo in 2 wks to reassess the effusion. Patient's daughter-n-law verbalized understanding. Echo ordered and scheduled for 08/08/2015.

## 2015-07-25 NOTE — Progress Notes (Signed)
Patient Name: Pamela Choi Date of Encounter: 07/25/2015  Primary Care Provider:  Lucretia Kern., DO Primary Cardiologist:  Joaquim Nam, MD   Chief Complaint  81 year old female status post recent admission for pericarditis and pericardial effusion who presents for follow-up.   Past Medical History   Past Medical History  Diagnosis Date  . Hyperlipidemia   . Hypertension   . Allergy   . Plantar wart of right foot   . Osteopenia   . Memory loss   . Chicken pox   . CARCINOMA, INFILTRATING DUCTAL, RIGHT BREAST 11/27/2003    Annotation: grade2, ER/PR and Her2-/neu positive status post exemestane x5 years, completed March of 2010 Qualifier: History of  By: Norma Fredrickson MD, Larena Glassman     . TOBACCO ABUSE, HX OF 11/08/2006    Qualifier: Diagnosis of  By: Linden Dolin MD, Marjory Lies    . ANKLE SPRAIN, RIGHT 05/10/2007    Qualifier: Diagnosis of  By: Norma Fredrickson MD, Larena Glassman    . Memory loss   . Pericarditis     a. 06/2015  . Pericardial effusion     a. 06/2015 Echo: EF 55%, mild LVH, mild AI, mild dil of Asc Ao (48m, 392m@ root), nl RV fxn, mild TR, small to mod circumferential pericardial effusion.   Past Surgical History  Procedure Laterality Date  . Breast surgery    . Hammer toe surgery      Right foot  . Bunionectomy      R foot    Allergies  Allergies  Allergen Reactions  . Bee Venom Anaphylaxis    HPI  [age]/o female with a h/o HTN, HL, memory loss, and remote right breast cancer, who was in her usual state of health until 07/17/2015, when she began to experience intermittent pleuritic chest discomfort that was worse with deep breathing and lying down. There were no associated symptoms. Due to ongoing symptoms, [facility], where she was hemodynamically stable. ECG was notable for inferior and anterolateral T wave inversion. Troponin was normal. D dimer was elevated @ 3.21. CTA of the chest was performed and was negative for PE but did show a small to  moderate pericardial effusion.Following admission, troponins remained normal. She was placed on colchicine, nsaid, and PPI therapy with improvement in chest pain symptoms. A 2D echocardiogram was performed on 7/23, and showed normal LV function with a mild to moderate circumferential pericardial effusion. There was question of a possible disruption of the intima of the ascending aorta in the setting of mild dilatation, measuring 4 cm. Previous CTA of the chest was reviewed with radiology and recommendation was made for CTA of the chest/Aorta. This was performed on 7/22 and did not show any evidence of aortic dissection. She was subsequently discharged on ibuprofen and colchicine and arrangements were made for follow-up office visit and echocardiogram today. Since her discharge, she has not been complaining of any chest discomfort or dyspnea. She denies PND, orthopnea, dizziness, syncope, edema, or early satiety. She does have memory deficits and her daughter is here to help with the history.  Home Medications  Prior to Admission medications   Medication Sig Start Date End Date Taking? Authorizing Provider  amLODipine (NORVASC) 5 MG tablet Take 1 tablet (5 mg total) by mouth daily. 11/25/14  Yes HaLucretia KernDO  atenolol (TENORMIN) 50 MG tablet Take 1 tablet (50 mg total) by mouth daily. 11/25/14  Yes HaLucretia KernDO  cetirizine (ZYRTEC) 10 MG  tablet Take 1 tablet (10 mg total) by mouth daily. 08/14/12  Yes Pedro Earls, MD  colchicine 0.6 MG tablet Take 1 tablet (0.6 mg total) by mouth 2 (two) times daily. 07/20/15  Yes Rogelia Mire, NP  ibuprofen (ADVIL,MOTRIN) 400 MG tablet Take 1 tablet (400 mg total) by mouth every 8 (eight) hours. 07/20/15  Yes Rogelia Mire, NP  pantoprazole (PROTONIX) 40 MG tablet Take 1 tablet (40 mg total) by mouth daily. 07/20/15  Yes Rogelia Mire, NP  pravastatin (PRAVACHOL) 40 MG tablet Take 1 tablet (40 mg total) by mouth daily. 11/25/14  Yes Lucretia Kern, DO    Review of Systems  She denies chest pain, palpitations, dyspnea, pnd, orthopnea, n, v, dizziness, syncope, edema, weight gain, or early satiety.  All other systems reviewed and are otherwise negative except as noted above.  Physical Exam  VS:  BP 100/60 mmHg  Pulse 75  Ht _0  (1.676 m)  Wt 125 lb (56.7 kg)  BMI 20.19 kg/m2 , BMI Body mass index is 20.19 kg/(m^2). GEN: Well nourished, well developed, in no acute distress. HEENT: normal. Neck: Supple, no JVD, carotid bruits, or masses. Cardiac: RRR, 2/6 systolic murmur at the right upper sternal border, soft intermittent rub at the apex. No clubbing, cyanosis. Trace bilateral lower extremity edema.  Radials/DP/PT 2+ and equal bilaterally.  Respiratory:  Respirations regular and unlabored, clear to auscultation bilaterally. GI: Soft, nontender, nondistended, BS + x 4. MS: no deformity or atrophy. Skin: warm and dry, no rash. Neuro:  Strength and sensation are intact. Psych: Normal affect.  Accessory Clinical Findings  ECG - regular sinus rhythm, 75, left axis, inferior and anterolateral T inversion. No acute ST or T changes.  Assessment & Plan  1.  Acute pericarditis/pericardial effusion: Status post recent admission for pleuritic chest pain with finding of moderate pericardial effusion. She has been treated for pericarditis with ibuprofen, colchicine, and PPI therapy. She is tolerating therapy and has not been having any recurrence of pleuritic chest pain. She denies dyspnea as well. She is hemodynamically stable with a normal heart rate today. She is scheduled for follow-up 2-D echocardiogram today to assess effusion. We will plan to keep her on anti-inflammatory therapy for at least the next 4-8 weeks and we will arrange for follow-up in approximately one month.  2. Essential hypertension: Blood pressure is soft today at 100/60 with oh she is completely asymptomatic. She remains on beta blocker and calcium channel  blocker therapy. Her daughter plans to buy a cuff so they can follow her pressure at home.  3. Hyperlipidemia: Continue statin therapy.  4. Disposition: Follow-up echocardiogram today. Follow-up in clinic in one month or sooner if necessary.  Murray Hodgkins, NP 07/25/2015, 11:29 AM

## 2015-07-25 NOTE — Patient Instructions (Signed)
Medication Instructions:  None  Labwork: None  Testing/Procedures: None  Follow-Up: Your physician recommends that you schedule a follow-up appointment in: 1 month with Dr. Acie Fredrickson.   Any Other Special Instructions Will Be Listed Below (If Applicable).

## 2015-07-28 ENCOUNTER — Telehealth: Payer: Self-pay | Admitting: Nurse Practitioner

## 2015-07-28 MED ORDER — IBUPROFEN 400 MG PO TABS: 400.0000 mg | ORAL_TABLET | ORAL | Status: DC | PRN

## 2015-07-28 NOTE — Telephone Encounter (Signed)
New message     Pt daughter in law state pt was put on ibuprofen and pt is having stomach cramps due to the medication Pt daughter in law wants to know if pt can be switched to something that would be easier on stomach Pt daughter in law wants to know if pt has been checked for a urinary track infection Please call to discuss

## 2015-07-28 NOTE — Telephone Encounter (Signed)
**Note De-Identified  Obfuscation** Pamela Choi is advised and she verbalized understanding. Ibuprofen has been changed to PRN in the pts chart as Pamela Choi states she may still take from time to time.

## 2015-07-28 NOTE — Telephone Encounter (Signed)
Reviewed the patient's chart. She was admitted on 07/18/15 for acute pericarditis/ pleural effusion. She was started on colchicine and motrin at that time. She followed up with Pamela Bayley, NP on 7/29 and instructions were to continue NSAIDS for 4-8 weeks. The patient is scheduled for an echo on 08/08/15 and follow up with Dr. Acie Choi on 09/04/15. According to her medication list, she is on protonix 40 mg daily. Will forward to Dr. Lyn Records, NP for review and recommendations.

## 2015-07-28 NOTE — Telephone Encounter (Signed)
Would be ideal to continue ibuprofen but if GI upset is present, she may stop but be sure to continue protonix and colchicine.

## 2015-07-29 ENCOUNTER — Encounter: Payer: Self-pay | Admitting: Family Medicine

## 2015-07-29 ENCOUNTER — Ambulatory Visit (INDEPENDENT_AMBULATORY_CARE_PROVIDER_SITE_OTHER): Payer: Medicare Other | Admitting: Family Medicine

## 2015-07-29 VITALS — BP 110/66 | HR 84 | Temp 99.4°F | Ht 66.0 in | Wt 122.6 lb

## 2015-07-29 DIAGNOSIS — R4189 Other symptoms and signs involving cognitive functions and awareness: Secondary | ICD-10-CM

## 2015-07-29 DIAGNOSIS — F39 Unspecified mood [affective] disorder: Secondary | ICD-10-CM

## 2015-07-29 MED ORDER — ESCITALOPRAM OXALATE 5 MG PO TABS: 5.0000 mg | ORAL_TABLET | Freq: Every day | ORAL | Status: DC

## 2015-07-29 NOTE — Progress Notes (Signed)
HPI:  Acute visit for:  Generalized Anxiety: -started: for several years, but worse with recent events -symptoms: anxiety, irritable, depressed mood -still feeds herself, dresses herself, does her own housework -denies: SI, falls -she has had an evaluation with neurology in the past, has mild dementia - she does not want to take medication for this. She has SVD and her neurologist advised asa daily.  Of note: recent hospitalization in 06/2015 for per pericarditis w/ pericardial effusion, followed closely by her cardiologist whom she saw a few days ago and has repeat echo planned 8/12 per review of chart - on ibuprofen, colchicine, protonix for this. Reports pain has been much better overall.  ROS: See pertinent positives and negatives per HPI.  Past Medical History  Diagnosis Date  . Hyperlipidemia   . Hypertension   . Allergy   . Plantar wart of right foot   . Osteopenia   . Memory loss   . Chicken pox   . CARCINOMA, INFILTRATING DUCTAL, RIGHT BREAST 11/27/2003    Annotation: grade2, ER/PR and Her2-/neu positive status post exemestane x5 years, completed March of 2010 Qualifier: History of  By: Norma Fredrickson MD, Larena Glassman     . TOBACCO ABUSE, HX OF 11/08/2006    Qualifier: Diagnosis of  By: Linden Dolin MD, Marjory Lies    . ANKLE SPRAIN, RIGHT 05/10/2007    Qualifier: Diagnosis of  By: Norma Fredrickson MD, Larena Glassman    . Memory loss   . Pericarditis     a. 06/2015  . Pericardial effusion     a. 06/2015 Echo: EF 55%, mild LVH, mild AI, mild dil of Asc Ao (31m, 366m@ root), nl RV fxn, mild TR, small to mod circumferential pericardial effusion.    Past Surgical History  Procedure Laterality Date  . Breast surgery    . Hammer toe surgery      Right foot  . Bunionectomy      R foot    Family History  Problem Relation Age of Onset  . Diabetes Mother   . Cancer Brother   . Heart attack Brother   . Hypertension Mother   . Stroke Neg Hx     History   Social History  . Marital Status: Widowed     Spouse Name: N/A  . Number of Children: 8  . Years of Education: HS   Occupational History  . Retired    Social History Main Topics  . Smoking status: Former Smoker    Quit date: 08/02/2003  . Smokeless tobacco: Not on file  . Alcohol Use: No  . Drug Use: No  . Sexual Activity: Not on file   Other Topics Concern  . None   Social History Narrative     Current outpatient prescriptions:  .  amLODipine (NORVASC) 5 MG tablet, Take 1 tablet (5 mg total) by mouth daily., Disp: 90 tablet, Rfl: 3 .  atenolol (TENORMIN) 50 MG tablet, Take 1 tablet (50 mg total) by mouth daily., Disp: 90 tablet, Rfl: 3 .  cetirizine (ZYRTEC) 10 MG tablet, Take 1 tablet (10 mg total) by mouth daily., Disp: 30 tablet, Rfl: 6 .  colchicine 0.6 MG tablet, Take 1 tablet (0.6 mg total) by mouth 2 (two) times daily., Disp: 60 tablet, Rfl: 2 .  pantoprazole (PROTONIX) 40 MG tablet, Take 1 tablet (40 mg total) by mouth daily., Disp: 30 tablet, Rfl: 3 .  pravastatin (PRAVACHOL) 40 MG tablet, Take 1 tablet (40 mg total) by mouth daily., Disp: 90 tablet, Rfl: 3 .  escitalopram (LEXAPRO) 5 MG tablet, Take 1 tablet (5 mg total) by mouth daily., Disp: 30 tablet, Rfl: 0  EXAM:  Filed Vitals:   07/29/15 1600  BP: 110/66  Pulse: 84  Temp: 99.4 F (37.4 C)    Body mass index is 19.8 kg/(m^2).  GENERAL: vitals reviewed and listed above, alert, oriented, appears well hydrated and in no acute distress  HEENT: atraumatic, conjunttiva clear, no obvious abnormalities on inspection of external nose and ears  NECK: no obvious masses on inspection  LUNGS: clear to auscultation bilaterally, no wheezes, rales or rhonchi, good air movement  CV: HRRR, SEM, bilat tr LE edema  MS: moves all extremities without noticeable abnormality  PSYCH: pleasant and cooperative, thought processing a little slow on gross exam, speech normal, CNII-XII grossly intact, finger to nose normal flat affect, thought processing  grossly  ASSESSMENT AND PLAN:  Discussed the following assessment and plan:  Mood disorder - Plan: escitalopram (LEXAPRO) 5 MG tablet -discussed options and daughter and pt agreed to try a very low dose antidepressant, close follow up, risks discussed -also advise CBT and information provided  Cognitive decline -had eval with neurology in the past, did not want to take any medications or asa for this, will consider asa and plans to discuss with cardiologist   -Patient advised to return or notify a doctor immediately if symptoms worsen or persist or new concerns arise.  Patient Instructions  BEFORE YOU LEAVE: -set up a follow up appointment in 1 month  Start Lexapro 52m daily  Consider therapy with Dr. BGlennon Hamilton Follow up with neurologist if you wish     KLucretia Kern

## 2015-07-29 NOTE — Patient Instructions (Signed)
BEFORE YOU LEAVE: -set up a follow up appointment in 1 month  Start Lexapro 5mg  daily  Consider therapy with Dr. Glennon Hamilton  Follow up with neurologist if you wish

## 2015-07-29 NOTE — Progress Notes (Signed)
Pre visit review using our clinic review tool, if applicable. No additional management support is needed unless otherwise documented below in the visit note. 

## 2015-08-08 ENCOUNTER — Other Ambulatory Visit: Payer: Self-pay

## 2015-08-08 ENCOUNTER — Ambulatory Visit (HOSPITAL_COMMUNITY): Payer: Medicare Other | Attending: Cardiology

## 2015-08-08 DIAGNOSIS — E785 Hyperlipidemia, unspecified: Secondary | ICD-10-CM | POA: Diagnosis not present

## 2015-08-08 DIAGNOSIS — I3139 Other pericardial effusion (noninflammatory): Secondary | ICD-10-CM

## 2015-08-08 DIAGNOSIS — I309 Acute pericarditis, unspecified: Secondary | ICD-10-CM

## 2015-08-08 DIAGNOSIS — I313 Pericardial effusion (noninflammatory): Secondary | ICD-10-CM | POA: Insufficient documentation

## 2015-08-08 DIAGNOSIS — I10 Essential (primary) hypertension: Secondary | ICD-10-CM | POA: Diagnosis not present

## 2015-08-08 DIAGNOSIS — Z87891 Personal history of nicotine dependence: Secondary | ICD-10-CM | POA: Insufficient documentation

## 2015-08-08 DIAGNOSIS — I319 Disease of pericardium, unspecified: Secondary | ICD-10-CM | POA: Diagnosis not present

## 2015-08-29 ENCOUNTER — Encounter: Payer: Self-pay | Admitting: Family Medicine

## 2015-08-29 ENCOUNTER — Ambulatory Visit (INDEPENDENT_AMBULATORY_CARE_PROVIDER_SITE_OTHER): Payer: Medicare Other | Admitting: Family Medicine

## 2015-08-29 VITALS — BP 120/76 | HR 75 | Temp 98.5°F | Ht 66.0 in | Wt 125.7 lb

## 2015-08-29 DIAGNOSIS — R413 Other amnesia: Secondary | ICD-10-CM

## 2015-08-29 DIAGNOSIS — E785 Hyperlipidemia, unspecified: Secondary | ICD-10-CM

## 2015-08-29 DIAGNOSIS — I319 Disease of pericardium, unspecified: Secondary | ICD-10-CM

## 2015-08-29 DIAGNOSIS — F3341 Major depressive disorder, recurrent, in partial remission: Secondary | ICD-10-CM

## 2015-08-29 DIAGNOSIS — I3139 Other pericardial effusion (noninflammatory): Secondary | ICD-10-CM

## 2015-08-29 DIAGNOSIS — F411 Generalized anxiety disorder: Secondary | ICD-10-CM | POA: Diagnosis not present

## 2015-08-29 DIAGNOSIS — R197 Diarrhea, unspecified: Secondary | ICD-10-CM

## 2015-08-29 DIAGNOSIS — I313 Pericardial effusion (noninflammatory): Secondary | ICD-10-CM

## 2015-08-29 DIAGNOSIS — F39 Unspecified mood [affective] disorder: Secondary | ICD-10-CM

## 2015-08-29 DIAGNOSIS — I10 Essential (primary) hypertension: Secondary | ICD-10-CM | POA: Diagnosis not present

## 2015-08-29 DIAGNOSIS — I309 Acute pericarditis, unspecified: Secondary | ICD-10-CM

## 2015-08-29 DIAGNOSIS — Z23 Encounter for immunization: Secondary | ICD-10-CM | POA: Diagnosis not present

## 2015-08-29 MED ORDER — ESCITALOPRAM OXALATE 5 MG PO TABS: 5.0000 mg | ORAL_TABLET | Freq: Every day | ORAL | Status: DC

## 2015-08-29 NOTE — Progress Notes (Signed)
Pre visit review using our clinic review tool, if applicable. No additional management support is needed unless otherwise documented below in the visit note. 

## 2015-08-29 NOTE — Patient Instructions (Signed)
BEFORE YOU LEAVE: -flu shot   Come fasting but drink plenty of water before physical

## 2015-08-29 NOTE — Progress Notes (Signed)
HPI:  Rafaella Kole Haigh is a frail elderly pleasant women with a complicated medical history here for follow up:  HM: flu vaccine  Depression and anxiety: -for several yeast, worse after hospitalization in 2016 -generalized anxiety, irritability and depressed mood -family wanted her to try medication and she started lexapro 5 mg 07/2015 -pt an daughter report remarkable improvement in mood on medication even at low dose, still with occ symptoms, but much better -denies depression, SI  HTN/HLD: -meds: norvasc, atenolol, pravastatin -denies: CP, SOB, DOE, leg cramps -stable  -not fasting and they want to hold off on labs until next visit  Memory Loss: -seeing neurology, had MRI with microvascular dz - ASA was advised, but she doesn't want to take this -denies worsening memory, HA, weakness -lives alone, but family member rotate coming 3x per day for meals and meds and feels she is safe  Pericarditis/pericardial effusion: -hospitalized 06/2015 for this -followed closely by cardiologist - they report f/u next week -denies CP, SOB, DOE, palpitations -they feel colchicine may be causing some diarrhea and plan to discuss with cardiologist - intermittent, non bloody, no bad smell since starting medication, no pain    ROS: See pertinent positives and negatives per HPI.  Past Medical History  Diagnosis Date  . Hyperlipidemia   . Hypertension   . Allergy   . Plantar wart of right foot   . Osteopenia   . Chicken pox   . CARCINOMA, INFILTRATING DUCTAL, RIGHT BREAST 11/27/2003    Annotation: grade2, ER/PR and Her2-/neu positive status post exemestane x5 years, completed March of 2010 Qualifier: History of  By: Norma Fredrickson MD, Larena Glassman     . TOBACCO ABUSE, HX OF 11/08/2006    Qualifier: Diagnosis of  By: Linden Dolin MD, Marjory Lies    . ANKLE SPRAIN, RIGHT 05/10/2007    Qualifier: Diagnosis of  By: Norma Fredrickson MD, Larena Glassman    . Pericarditis     a. 06/2015  . Pericardial effusion     a. 06/2015 Echo:  EF 55%, mild LVH, mild AI, mild dil of Asc Ao (38mm, 45mm @ root), nl RV fxn, mild TR, small to mod circumferential pericardial effusion.  . Mild dementia     eval with neurology in 2014, mod SVD, declined tx  . Anxiety and depression     Past Surgical History  Procedure Laterality Date  . Breast surgery    . Hammer toe surgery      Right foot  . Bunionectomy      R foot    Family History  Problem Relation Age of Onset  . Diabetes Mother   . Cancer Brother   . Heart attack Brother   . Hypertension Mother   . Stroke Neg Hx     Social History   Social History  . Marital Status: Widowed    Spouse Name: N/A  . Number of Children: 8  . Years of Education: HS   Occupational History  . Retired    Social History Main Topics  . Smoking status: Former Smoker    Quit date: 08/02/2003  . Smokeless tobacco: None  . Alcohol Use: No  . Drug Use: No  . Sexual Activity: Not Asked   Other Topics Concern  . None   Social History Narrative     Current outpatient prescriptions:  .  amLODipine (NORVASC) 5 MG tablet, Take 1 tablet (5 mg total) by mouth daily., Disp: 90 tablet, Rfl: 3 .  atenolol (TENORMIN) 50 MG tablet, Take 1 tablet (50  mg total) by mouth daily., Disp: 90 tablet, Rfl: 3 .  cetirizine (ZYRTEC) 10 MG tablet, Take 1 tablet (10 mg total) by mouth daily., Disp: 30 tablet, Rfl: 6 .  colchicine 0.6 MG tablet, Take 1 tablet (0.6 mg total) by mouth 2 (two) times daily., Disp: 60 tablet, Rfl: 2 .  escitalopram (LEXAPRO) 5 MG tablet, Take 1 tablet (5 mg total) by mouth daily., Disp: 30 tablet, Rfl: 0 .  pantoprazole (PROTONIX) 40 MG tablet, Take 1 tablet (40 mg total) by mouth daily., Disp: 30 tablet, Rfl: 3 .  pravastatin (PRAVACHOL) 40 MG tablet, Take 1 tablet (40 mg total) by mouth daily., Disp: 90 tablet, Rfl: 3  EXAM:  Filed Vitals:   08/29/15 1005  BP: 120/76  Pulse: 75  Temp: 98.5 F (36.9 C)    Body mass index is 20.3 kg/(m^2).  GENERAL: vitals reviewed  and listed above, alert, oriented, appears well hydrated and in no acute distress  HEENT: atraumatic, conjunttiva clear, no obvious abnormalities on inspection of external nose and ears  NECK: no obvious masses on inspection  LUNGS: clear to auscultation bilaterally, no wheezes, rales or rhonchi, good air movement  CV: HRRR, no peripheral edema  MS: moves all extremities without noticeable abnormality  PSYCH: pleasant and cooperative, no obvious depression or anxiety  ASSESSMENT AND PLAN:  Discussed the following assessment and plan:  MDD (major depressive disorder), recurrent, in partial remission GAD (generalized anxiety disorder) Memory loss -continue very low dose lexapro  Essential hypertension -cont current tx, labs next visit  Hyperlipemia -cont current tx, labs next visit  Pericardial effusion Acute pericarditis -sees cardiology next week  Diarrhea -possible medication side effect, imodium, advised c. Diff test but they feel is medication and declined today, advised f/u if persists  -Patient advised to return or notify a doctor immediately if symptoms worsen or persist or new concerns arise.  There are no Patient Instructions on file for this visit.   Colin Benton R.

## 2015-08-29 NOTE — Addendum Note (Signed)
Addended by: Agnes Lawrence on: 08/29/2015 01:35 PM   Modules accepted: Orders

## 2015-09-04 ENCOUNTER — Ambulatory Visit (INDEPENDENT_AMBULATORY_CARE_PROVIDER_SITE_OTHER): Payer: Medicare Other | Admitting: Cardiovascular Disease

## 2015-09-04 ENCOUNTER — Encounter: Payer: Self-pay | Admitting: Cardiovascular Disease

## 2015-09-04 VITALS — BP 124/68 | HR 69 | Ht 64.0 in | Wt 124.1 lb

## 2015-09-04 DIAGNOSIS — I309 Acute pericarditis, unspecified: Secondary | ICD-10-CM | POA: Diagnosis not present

## 2015-09-04 DIAGNOSIS — E785 Hyperlipidemia, unspecified: Secondary | ICD-10-CM

## 2015-09-04 NOTE — Progress Notes (Signed)
Patient Name: Pamela Choi Date of Encounter: 09/04/2015  Primary Care Provider:  Lucretia Kern., DO Primary Cardiologist:  Joaquim Nam, MD   Chief Complaint  81 year old female status post recent admission for pericarditis and pericardial effusion who presents for follow-up.   Sept. 8, 2016  Doing well.  No CP ,  Having some diarrhea  - likely associated with the colchicine , not all that bad Overall doing very well.     Past Medical History   Past Medical History  Diagnosis Date  . Hyperlipidemia   . Hypertension   . Allergy   . Plantar wart of right foot   . Osteopenia   . Chicken pox   . CARCINOMA, INFILTRATING DUCTAL, RIGHT BREAST 11/27/2003    Annotation: grade2, ER/PR and Her2-/neu positive status post exemestane x5 years, completed March of 2010 Qualifier: History of  By: Norma Fredrickson MD, Larena Glassman     . TOBACCO ABUSE, HX OF 11/08/2006    Qualifier: Diagnosis of  By: Linden Dolin MD, Marjory Lies    . ANKLE SPRAIN, RIGHT 05/10/2007    Qualifier: Diagnosis of  By: Norma Fredrickson MD, Larena Glassman    . Pericarditis     a. 06/2015  . Pericardial effusion     a. 06/2015 Echo: EF 55%, mild LVH, mild AI, mild dil of Asc Ao (46mm, 30mm @ root), nl RV fxn, mild TR, small to mod circumferential pericardial effusion.  . Mild dementia     eval with neurology in 2014, mod SVD, declined tx  . Anxiety and depression    Past Surgical History  Procedure Laterality Date  . Breast surgery    . Hammer toe surgery      Right foot  . Bunionectomy      R foot    Allergies  Allergies  Allergen Reactions  . Bee Venom Anaphylaxis    HPI  81 y/o female with a h/o HTN, HL, memory loss, and remote right breast cancer, who was in her usual state of health until 07/17/2015, when she began to experience intermittent pleuritic chest discomfort that was worse with deep breathing and lying down. There were no associated symptoms. Due to ongoing symptoms, she presented to the [facility] ED on 7/22, where she was  hemodynamically stable. ECG was notable for inferior and anterolateral T wave inversion. Troponin was normal. D dimer was elevated @ 3.21. CTA of the chest was performed and was negative for PE but did show a small to moderate pericardial effusion.Following admission, troponins remained normal. She was placed on colchicine, nsaid, and PPI therapy with improvement in chest pain symptoms. A 2D echocardiogram was performed on 7/23, and showed normal LV function with a mild to moderate circumferential pericardial effusion. There was question of a possible disruption of the intima of the ascending aorta in the setting of mild dilatation, measuring 4 cm. Previous CTA of the chest was reviewed with radiology and recommendation was made for CTA of the chest/Aorta. This was performed on 7/22 and did not show any evidence of aortic dissection. She was subsequently discharged on ibuprofen and colchicine and arrangements were made for follow-up office visit and echocardiogram today. Since her discharge, she has not been complaining of any chest discomfort or dyspnea. She denies PND, orthopnea, dizziness, syncope, edema, or early satiety. She does have memory deficits and her daughter is here to help with the history.  Home Medications  Prior to Admission medications   Medication Sig Start Date End Date Taking? Authorizing Provider  amLODipine (  NORVASC) 5 MG tablet Take 1 tablet (5 mg total) by mouth daily. 11/25/14  Yes Lucretia Kern, DO  atenolol (TENORMIN) 50 MG tablet Take 1 tablet (50 mg total) by mouth daily. 11/25/14  Yes Lucretia Kern, DO  cetirizine (ZYRTEC) 10 MG tablet Take 1 tablet (10 mg total) by mouth daily. 08/14/12  Yes Pedro Earls, MD  colchicine 0.6 MG tablet Take 1 tablet (0.6 mg total) by mouth 2 (two) times daily. 07/20/15  Yes Rogelia Mire, NP  ibuprofen (ADVIL,MOTRIN) 400 MG tablet Take 1 tablet (400 mg total) by mouth every 8 (eight) hours. 07/20/15  Yes Rogelia Mire, NP    pantoprazole (PROTONIX) 40 MG tablet Take 1 tablet (40 mg total) by mouth daily. 07/20/15  Yes Rogelia Mire, NP  pravastatin (PRAVACHOL) 40 MG tablet Take 1 tablet (40 mg total) by mouth daily. 11/25/14  Yes Lucretia Kern, DO    Review of Systems  She denies chest pain, palpitations, dyspnea, pnd, orthopnea, n, v, dizziness, syncope, edema, weight gain, or early satiety.  All other systems reviewed and are otherwise negative except as noted above.  Physical Exam  VS:  BP 124/68 mmHg  Pulse 69  Ht $R'5\' 4"'GA$  (1.626 m)  Wt 56.3 kg (124 lb 1.9 oz)  BMI 21.29 kg/m2  SpO2 98% , BMI Body mass index is 21.29 kg/(m^2). GEN: Well nourished, well developed, in no acute distress. HEENT: normal. Neck: Supple, no JVD, carotid bruits, or masses. Cardiac: RRR, 2/6 systolic murmur at the right upper sternal border, soft intermittent rub at the apex. No clubbing, cyanosis. Trace bilateral lower extremity edema.  Radials/DP/PT 2+ and equal bilaterally.  Respiratory:  Respirations regular and unlabored, clear to auscultation bilaterally. GI: Soft, nontender, nondistended, BS + x 4. MS: no deformity or atrophy. Skin: warm and dry, no rash. Neuro:  Strength and sensation are intact. Psych: Normal affect.  Accessory Clinical Findings  ECG - regular sinus rhythm, 75, left axis, inferior and anterolateral T inversion. No acute ST or T changes.  Assessment & Plan  1.  Acute pericarditis/pericardial effusion:  Has been on colchicine and motrin . Has had some diarrhea.   OK to cut back on the chochicine if the diarrhea becomes too severe Continue for 2 months and then dc. Will see her again in 6 months  And then as needed.     2. Essential hypertension: Blood pressure is soft today at 100/60 with oh she is completely asymptomatic.    3. Hyperlipidemia: Continue statin therapy.  Managed by her medical doctor   4. Disposition:   Thayer Headings, NP 09/04/2015, 9:59 AM

## 2015-09-04 NOTE — Patient Instructions (Signed)
Medication Instructions:  STOP Colchicine in 2 months (mid November)   Labwork: None Ordered   Testing/Procedures: None Ordered    Follow-Up: Your physician wants you to follow-up in: 6 months with Dr. Acie Fredrickson.  You will receive a reminder letter in the mail two months in advance. If you don't receive a letter, please call our office to schedule the follow-up appointment.

## 2015-09-25 ENCOUNTER — Telehealth: Payer: Self-pay | Admitting: Neurology

## 2015-09-25 ENCOUNTER — Ambulatory Visit (INDEPENDENT_AMBULATORY_CARE_PROVIDER_SITE_OTHER): Payer: Medicare Other | Admitting: Neurology

## 2015-09-25 ENCOUNTER — Encounter: Payer: Self-pay | Admitting: Neurology

## 2015-09-25 VITALS — BP 124/71 | HR 67 | Ht 65.0 in | Wt 132.5 lb

## 2015-09-25 DIAGNOSIS — R413 Other amnesia: Secondary | ICD-10-CM | POA: Diagnosis not present

## 2015-09-25 MED ORDER — DONEPEZIL HCL 5 MG PO TABS: 5.0000 mg | ORAL_TABLET | Freq: Every day | ORAL | Status: DC

## 2015-09-25 NOTE — Telephone Encounter (Signed)
The family wishes to start Aricept, I will call in a prescription.

## 2015-09-25 NOTE — Patient Instructions (Signed)
   Begin Aricept (donepezil) at 5 mg at night for one month. If this medication is well-tolerated, please call our office and we will call in a prescription for the 10 mg tablets. Look out for side effects that may include nausea, diarrhea, weight loss, or stomach cramps. This medication will also cause a runny nose, therefore there is no need for allergy medications for this purpose.  

## 2015-09-25 NOTE — Progress Notes (Signed)
Reason for visit: Memory disturbance  Pamela Choi is an 81 y.o. female  History of present illness:  Pamela Choi is an 81 year old left-handed black female with a history of a progressive memory disturbance. The patient has undergone blood work that shows a low titer positive RPR, this may be related to a false positive or an adequately treated case of syphilis. The patient has undergone MRI evaluation of the brain that shows some minimal small vessel disease. The patient continues to have a decline in memory, she lives alone, but the family checks on her frequently. She no longer manages her medications, she does not operate a motor vehicle, she does not cook, but she will heat up items in the microwave to eat. She spends most of her days watching TV. She is not active, she does not tend to wander. She now is on low-dose Lexapro. She recently was seen for a problem with pericarditis, she is on colchicine for this currently. She returns to this office for further evaluation. Previously, she did not wish to go on any medication for memory.  Past Medical History  Diagnosis Date  . Hyperlipidemia   . Hypertension   . Allergy   . Plantar wart of right foot   . Osteopenia   . Chicken pox   . CARCINOMA, INFILTRATING DUCTAL, RIGHT BREAST 11/27/2003    Annotation: grade2, ER/PR and Her2-/neu positive status post exemestane x5 years, completed March of 2010 Qualifier: History of  By: Sherlon Handing MD, Larene Pickett     . TOBACCO ABUSE, HX OF 11/08/2006    Qualifier: Diagnosis of  By: Michael Boston MD, Clifton Custard    . ANKLE SPRAIN, RIGHT 05/10/2007    Qualifier: Diagnosis of  By: Sherlon Handing MD, Larene Pickett    . Pericarditis     a. 06/2015  . Pericardial effusion     a. 06/2015 Echo: EF 55%, mild LVH, mild AI, mild dil of Asc Ao (36mm, 82mm @ root), nl RV fxn, mild TR, small to mod circumferential pericardial effusion.  . Mild dementia     eval with neurology in 2014, mod SVD, declined tx  . Anxiety and depression      Past Surgical History  Procedure Laterality Date  . Breast surgery    . Hammer toe surgery      Right foot  . Bunionectomy      R foot    Family History  Problem Relation Age of Onset  . Diabetes Mother   . Hypertension Mother   . Cancer Brother   . Heart attack Brother   . Stroke Neg Hx     Social history:  reports that she quit smoking about 12 years ago. She does not have any smokeless tobacco history on file. She reports that she does not drink alcohol or use illicit drugs.    Allergies  Allergen Reactions  . Bee Venom Anaphylaxis    Medications:  Prior to Admission medications   Medication Sig Start Date End Date Taking? Authorizing Provider  amLODipine (NORVASC) 5 MG tablet Take 1 tablet (5 mg total) by mouth daily. 11/25/14  Yes Terressa Koyanagi, DO  atenolol (TENORMIN) 50 MG tablet Take 1 tablet (50 mg total) by mouth daily. 11/25/14  Yes Terressa Koyanagi, DO  cetirizine (ZYRTEC) 10 MG tablet Take 1 tablet (10 mg total) by mouth daily. 08/14/12  Yes Elyse Jarvis, MD  colchicine 0.6 MG tablet Take 1 tablet (0.6 mg total) by mouth 2 (two) times daily. 07/20/15  Yes Rogelia Mire, NP  escitalopram (LEXAPRO) 5 MG tablet Take 1 tablet (5 mg total) by mouth daily. 08/29/15  Yes Lucretia Kern, DO  pravastatin (PRAVACHOL) 40 MG tablet Take 1 tablet (40 mg total) by mouth daily. 11/25/14  Yes Lucretia Kern, DO    ROS:  Out of a complete 14 system review of symptoms, the patient complains only of the following symptoms, and all other reviewed systems are negative.  Memory loss Joint pain Agitation, depression  Blood pressure 124/71, pulse 67, height $RemoveBe'5\' 5"'GjttttWSW$  (1.651 m), weight 132 lb 8 oz (60.102 kg).  Physical Exam  General: The patient is alert and cooperative at the time of the examination.  Skin: No significant peripheral edema is noted.   Neurologic Exam  Mental status: The patient is alert and oriented x 2 at the time of the examination (not oriented to date).  The Mini-Mental Status Examination done today shows a total score of 18/30. The patient is able to name 5 animals in 30 seconds.   Cranial nerves: Facial symmetry is present. Speech is normal, no aphasia or dysarthria is noted. Extraocular movements are full. Visual fields are full.  Motor: The patient has good strength in all 4 extremities.  Sensory examination: Soft touch sensation is symmetric on the face, arms, and legs.  Coordination: The patient has good finger-nose-finger and heel-to-shin bilaterally.  Gait and station: The patient has a normal gait. Tandem gait is slightly unsteady. Romberg is negative. No drift is seen.  Reflexes: Deep tendon reflexes are symmetric.   Assessment/Plan:  1. Progressive memory disturbance  The patient likely has an Alzheimer's disease process. Her Mini-Mental Status Examination continues to decline. The patient does not wish to go on medications for memory, the patient does have a power of attorney, the family may decide that they do wish to initiate Aricept at some point. They will contact our office if this is the case. Otherwise, the patient will follow-up on an as-needed basis.  Jill Alexanders MD 09/25/2015 7:42 PM  [facility] [address] [place], [place] [address]  Phone [phone] Fax [phone]

## 2015-09-25 NOTE — Telephone Encounter (Signed)
Pt called and would like to start with Aricept. Thank you

## 2015-10-13 ENCOUNTER — Telehealth: Payer: Self-pay | Admitting: Neurology

## 2015-10-13 MED ORDER — DONEPEZIL HCL 10 MG PO TABS: 10.0000 mg | ORAL_TABLET | Freq: Every day | ORAL | Status: DC

## 2015-10-13 NOTE — Telephone Encounter (Signed)
Pt's daughter Shirlean Mylar called and states pt is doing very well on donepezil (ARICEPT) 5 MG tablet and they would like 90 day rx with 3 refills. Please call [phone] Katharine Look ( Robin's sister in law)

## 2015-10-13 NOTE — Telephone Encounter (Signed)
I called the patient's daughter, Shirlean Mylar. I advised that there is still a refill on file for Aricept at [facility]. She then asked when the patient should follow up now that she has decided to start medication for her memory loss. I advised that I would check with Dr. Jannifer Franklin and call her back to schedule an appointment.

## 2015-10-13 NOTE — Telephone Encounter (Signed)
Katharine Look returned call. She did get the message that Charisse Klinefelter has already talked to [place]. If Charisse Klinefelter still needs to speak with Katharine Look please call back to [phone].

## 2015-10-13 NOTE — Telephone Encounter (Signed)
I called the family. The patient has done well on the 5 mg Aricept, we will increase the dose to the 10 mg dose.

## 2015-10-24 ENCOUNTER — Telehealth: Payer: Self-pay | Admitting: Neurology

## 2015-10-24 ENCOUNTER — Other Ambulatory Visit: Payer: Self-pay | Admitting: Nurse Practitioner

## 2015-10-24 NOTE — Telephone Encounter (Signed)
Pts daughter called and states that she is concerned that her mother might be having an allergic reaction to Aricept. Symptoms: extremely dry skin. No other symptoms. Has not increased dosage to 10 mg yet, as prescribed. They wanted her to finish taking the 5mg  first. They want to know if they should stop the medication. Please call and advise

## 2015-10-24 NOTE — Telephone Encounter (Signed)
I called the patient's daughter-in-law, Katharine Look. She stated that the patient has been taking Aricept for about 30 days. Last night the patient c/o feeling itchy. Katharine Look looked at her skin and noticed it was very dry. She did not see any bumps or redness. She encouraged the patient to keep her skin moisturized but wanted to let Dr. Jannifer Franklin know as well. I explained that sometimes medications have side effects but the benefit of the medication may outweigh the side effect. I advised that I would make sure Dr. Jannifer Franklin doesn't want to make any changes and call her back.

## 2015-10-24 NOTE — Telephone Encounter (Signed)
I called the daughter-in-law. Aricept usually is not associated with dryness of the skin, it is a cholinergic medication so it should if anything promote moistness of the skin. I suspect that the dryness of the skin is related to the recent weather changes, cooler air and drier air.

## 2015-10-26 ENCOUNTER — Other Ambulatory Visit: Payer: Self-pay | Admitting: Nurse Practitioner

## 2015-10-28 ENCOUNTER — Encounter: Payer: Self-pay | Admitting: Podiatry

## 2015-10-28 ENCOUNTER — Ambulatory Visit (INDEPENDENT_AMBULATORY_CARE_PROVIDER_SITE_OTHER): Payer: Medicare Other | Admitting: Podiatry

## 2015-10-28 DIAGNOSIS — M79673 Pain in unspecified foot: Secondary | ICD-10-CM

## 2015-10-28 DIAGNOSIS — Q828 Other specified congenital malformations of skin: Secondary | ICD-10-CM | POA: Diagnosis not present

## 2015-10-28 DIAGNOSIS — B351 Tinea unguium: Secondary | ICD-10-CM | POA: Diagnosis not present

## 2015-10-28 NOTE — Progress Notes (Signed)
Patient ID: Pamela Choi, female   DOB: 07/18/1930, 81 y.o.   MRN: [id number] Complaint:  Visit Type: Patient returns to my office for continued preventative foot care services. Complaint: Patient states" my nails have grown long and thick and become painful to walk and wear shoes" Patient has been diagnosed with DM with no foot complications. The patient presents for preventative foot care services. No changes to ROS.  She has significantly painful callus under the ball of her right foot.  Podiatric Exam: Vascular: dorsalis pedis and posterior tibial pulses are palpable bilateral. Capillary return is immediate. Temperature gradient is WNL. Skin turgor WNL  Sensorium: Normal Semmes Weinstein monofilament test. Normal tactile sensation bilaterally. Nail Exam: Pt has thick disfigured discolored nails with subungual debris noted bilateral entire nail hallux through fifth toenails Ulcer Exam: There is no evidence of ulcer or pre-ulcerative changes or infection. Orthopedic Exam: Muscle tone and strength are WNL. No limitations in general ROM. No crepitus or effusions noted. Foot type and digits show no abnormalities. Bony prominences are unremarkable. Skin:  Porokeratosis sub 3 right foot.. No infection or ulcers  Diagnosis:  Onychomycosis, , Pain in right toe, pain in left toes, Porokeratosis. sub3 right foot.  Treatment & Plan Procedures and Treatment: Consent by patient was obtained for treatment procedures. The patient understood the discussion of treatment and procedures well. All questions were answered thoroughly reviewed. Debridement of mycotic and hypertrophic toenails, 1 through 5 bilateral and clearing of subungual debris. No ulceration, no infection noted.  Return Visit-Office Procedure: Patient instructed to return to the office for a follow up visit 3 months for continued evaluation and treatment. Her daughter discussed her porokeratosis with me and after discussing her lesion, she  desires to consider currtetage of porokeratosis.  She wants to discuss surgery with Dr. Paulla Dolly.

## 2015-10-29 ENCOUNTER — Telehealth: Payer: Self-pay | Admitting: Nurse Practitioner

## 2015-10-29 NOTE — Telephone Encounter (Signed)
If she is continuing to have diarrhea, it may well be related to the colchicine and she can stop.  She is Dr. Elmarie Shiley pt and he saw her last on 9/8.  Feel free to run this past him as well if the patient wishes or Dr. Acie Fredrickson is available this week.

## 2015-10-29 NOTE — Telephone Encounter (Signed)
Pt's daughter is calling inquiring about the medication colchicine 0.6mg . On last OV note it was stated that it was ok for the pt to cut back on the Colchicine if the diarrhea becomes too severe. Continue for 2 mos and then D/C. I informed the pt's daughter about this and daughter was still wanting classification concerning taking this medication. Please advise

## 2015-10-30 NOTE — Telephone Encounter (Signed)
Called pt's daughter Katharine Look and left message informing her that per Ignacia Bayley, PA that it was ok for the pt to stop taking Colchicine 0.6 mg tablet and that Dr. Julious Payer nurse Sharyn Lull, RN was also aware of this and if she has any other problems, questions or concerns to call the office.

## 2015-11-07 ENCOUNTER — Ambulatory Visit (INDEPENDENT_AMBULATORY_CARE_PROVIDER_SITE_OTHER): Payer: Medicare Other | Admitting: Family Medicine

## 2015-11-07 ENCOUNTER — Ambulatory Visit (HOSPITAL_COMMUNITY)
Admission: RE | Admit: 2015-11-07 | Discharge: 2015-11-07 | Disposition: A | Discharge status: Home / Self Care | Payer: Medicare Other | Source: Ambulatory Visit | Attending: Family Medicine | Admitting: Family Medicine

## 2015-11-07 ENCOUNTER — Telehealth: Payer: Self-pay | Admitting: *Deleted

## 2015-11-07 ENCOUNTER — Encounter: Payer: Self-pay | Admitting: Family Medicine

## 2015-11-07 VITALS — BP 128/72 | HR 56 | Temp 97.9°F | Ht 64.25 in | Wt 139.5 lb

## 2015-11-07 DIAGNOSIS — E785 Hyperlipidemia, unspecified: Secondary | ICD-10-CM | POA: Insufficient documentation

## 2015-11-07 DIAGNOSIS — I679 Cerebrovascular disease, unspecified: Secondary | ICD-10-CM | POA: Diagnosis not present

## 2015-11-07 DIAGNOSIS — G309 Alzheimer's disease, unspecified: Secondary | ICD-10-CM

## 2015-11-07 DIAGNOSIS — I10 Essential (primary) hypertension: Secondary | ICD-10-CM

## 2015-11-07 DIAGNOSIS — I319 Disease of pericardium, unspecified: Secondary | ICD-10-CM | POA: Diagnosis not present

## 2015-11-07 DIAGNOSIS — I313 Pericardial effusion (noninflammatory): Secondary | ICD-10-CM

## 2015-11-07 DIAGNOSIS — R600 Localized edema: Secondary | ICD-10-CM | POA: Diagnosis not present

## 2015-11-07 DIAGNOSIS — Z87891 Personal history of nicotine dependence: Secondary | ICD-10-CM | POA: Insufficient documentation

## 2015-11-07 DIAGNOSIS — I3139 Other pericardial effusion (noninflammatory): Secondary | ICD-10-CM

## 2015-11-07 DIAGNOSIS — I6789 Other cerebrovascular disease: Secondary | ICD-10-CM

## 2015-11-07 DIAGNOSIS — Z0000 Encounter for general adult medical examination without abnormal findings: Secondary | ICD-10-CM

## 2015-11-07 DIAGNOSIS — F3342 Major depressive disorder, recurrent, in full remission: Secondary | ICD-10-CM

## 2015-11-07 DIAGNOSIS — F411 Generalized anxiety disorder: Secondary | ICD-10-CM

## 2015-11-07 DIAGNOSIS — M79604 Pain in right leg: Secondary | ICD-10-CM | POA: Insufficient documentation

## 2015-11-07 DIAGNOSIS — F0280 Dementia in other diseases classified elsewhere without behavioral disturbance: Secondary | ICD-10-CM

## 2015-11-07 LAB — BASIC METABOLIC PANEL
BUN: 22 mg/dL (ref 6–23)
CHLORIDE: 108 meq/L (ref 96–112)
CO2: 30 mEq/L (ref 19–32)
CREATININE: 0.86 mg/dL (ref 0.40–1.20)
Calcium: 8.8 mg/dL (ref 8.4–10.5)
GFR: 80.93 mL/min (ref >60.00)
Glucose, Bld: 88 mg/dL (ref 70–99)
Potassium: 4 mEq/L (ref 3.5–5.1)
SODIUM: 142 meq/L (ref 135–145)

## 2015-11-07 LAB — LIPID PANEL
CHOL/HDL RATIO: 2
Cholesterol: 168 mg/dL (ref 0–200)
HDL: 76.3 mg/dL (ref >39.00)
LDL Cholesterol: 83 mg/dL (ref 0–99)
NONHDL: 91.76
Triglycerides: 43 mg/dL (ref 0.0–149.0)
VLDL: 8.6 mg/dL (ref 0.0–40.0)

## 2015-11-07 NOTE — Progress Notes (Signed)
Medicare Annual Preventive Care Visit  (initial annual wellness or annual wellness exam)  Pamela Choi is a pleasant frail elderly woman with a complicated medical history here for her annual wellness exam and follow up:  R LE swelling: -for a few days -no known trauma or injury, redness, fevers, malaise, CP, SOB, DOE  Depression and anxiety: -much improved since starting very low dose lexapro in 2016 -denies depression, SI  HTN/HLD: -meds: norvasc, atenolol, pravastatin -denies: CP, SOB, DOE, leg cramps -stable  -not fasting and they want to hold off on labs until next visit  Memory Loss: -seeing neurology, had MRI with microvascular dz - ASA was advised, but she doesn't want to take this -now on aricept per review of notes -denies worsening memory, HA, weakness -lives alone, but family members rotate coming 3x per day for meals and meds and feels she is safe  Pericarditis/pericardial effusion: -hospitalized 06/2015 for this -followed  by cardiologist - reviewed last notes, off colchicine now -denies CP, SOB, DOE, palpitations   ROS: negative for report of fevers, unintentional weight loss, vision changes, vision loss, hearing loss or change, chest pain, sob, hemoptysis, melena, hematochezia, hematuria, genital discharge or lesions, falls, bleeding or bruising, loc, thoughts of suicide or self harm, memory loss  1.) Patient-completed health risk assessment  - completed and reviewed, see scanned documentation  2.) Review of Medical History: -PMH, PSH, Family History and current specialty and care providers reviewed and updated and listed below  - see scanned in document in chart and below  Past Medical History  Diagnosis Date  . Hyperlipidemia   . Hypertension   . Allergy   . Plantar wart of right foot   . Osteopenia   . Chicken pox   . CARCINOMA, INFILTRATING DUCTAL, RIGHT BREAST 11/27/2003    Annotation: grade2, ER/PR and Her2-/neu positive status post exemestane  x5 years, completed March of 2010 Qualifier: History of  By: Norma Fredrickson MD, Larena Glassman     . TOBACCO ABUSE, HX OF 11/08/2006    Qualifier: Diagnosis of  By: Linden Dolin MD, Marjory Lies    . ANKLE SPRAIN, RIGHT 05/10/2007    Qualifier: Diagnosis of  By: Norma Fredrickson MD, Larena Glassman    . Pericarditis     a. 06/2015  . Pericardial effusion     a. 06/2015 Echo: EF 55%, mild LVH, mild AI, mild dil of Asc Ao (75m, 320m@ root), nl RV fxn, mild TR, small to mod circumferential pericardial effusion.  . Mild dementia     eval with neurology in 2014, mod SVD, declined tx  . Anxiety and depression     Past Surgical History  Procedure Laterality Date  . Breast surgery    . Hammer toe surgery      Right foot  . Bunionectomy      R foot    Social History   Social History  . Marital Status: Widowed    Spouse Name: N/A  . Number of Children: 8  . Years of Education: HS   Occupational History  . Retired    Social History Main Topics  . Smoking status: Former Smoker    Quit date: 08/02/2003  . Smokeless tobacco: Not on file  . Alcohol Use: No  . Drug Use: No  . Sexual Activity: Not on file   Other Topics Concern  . Not on file   Social History Narrative   Patient does not drink caffeine.   Patient is left handed.     The patient  has a family history of  3.) Review of functional ability and level of safety:  Any difficulty hearing? NO  History of falling? NO  Any trouble with IADLs - using a phone, using transportation, grocery shopping, preparing meals, doing housework, doing laundry, taking medications and managing money? YES - but family does this for her, she has family members in home 3 times per day  Advance Directives? YES  See summary of recommendations in Patient Instructions below.  4.) Physical Exam Filed Vitals:   11/07/15 0817  BP: 128/72  Pulse: 56  Temp: 97.9 F (36.6 C)   Estimated body mass index is 23.76 kg/(m^2) as calculated from the following:   Height as of  this encounter: 5' 4.25" (1.632 m).   Weight as of this encounter: 139 lb 8 oz (63.277 kg).  EKG (optional): deferred  General: alert, appear well hydrated and in no acute distress  HEENT: visual acuity grossly intact, normal inspection of nose, cerumen impaction bilaterally  Neck: no JVD  CV: HRRR, bilat LE edema R > L  Lungs: CTA bilaterally  SKIN: no erythema, breaks or warmth in skin bilat LE, some chronic venous stasis dermatitis  Psych: pleasant and cooperative, no obvious depression or anxiety  Mild cognitive impairment on gross exam  See patient instructions for recommendations.  Education and counseling regarding the above review of health provided with a plan for the following: -see scanned patient completed form for further details -fall prevention strategies discussed  -healthy lifestyle discussed -importance and resources for completing advanced directives discussed -see patient instructions below for any other recommendations provided  4)The following written screening schedule of preventive measures were reviewed with assessment and plan made per below, orders and patient instructions:       Alcohol screening done     Obesity Screening and counseling done     STI screening (Hep C if born [birth year]) declined     Tobacco Screening done       Pneumococcal (PPSV23 -one dose after 64, one before if risk factors), influenza yearly and hepatitis B vaccines (if high risk - end stage renal disease, IV drugs, homosexual men, live in home for mentally retarded, hemophilia receiving factors) ASSESSMENT/PLAN: done      Screening mammograph (yearly if >40) ASSESSMENT/PLAN: done      Screening Pap smear/pelvic exam (q2 years) ASSESSMENT/PLAN: n/a     Prostate cancer screening ASSESSMENT/PLAN:      Colorectal cancer screening (FOBT yearly or flex sig q4y or colonoscopy q10y or barium enema q4y) ASSESSMENT/PLAN: declined      Diabetes outpatient self-management  training services ASSESSMENT/PLAN: n/a      Bone mass measurements(covered q2y if indicated - estrogen def, osteoporosis, hyperparathyroid, vertebral abnormalities, osteoporosis or steroids) ASSESSMENT/PLAN: declined, done in the past, they agree to start Vit D      Screening for glaucoma(q1y if high risk - diabetes, FH, AA and > 50 or hispanic and > 65) ASSESSMENT/PLAN: they agree to see eye doctor      Medical nutritional therapy for individuals with diabetes or renal disease ASSESSMENT/PLAN: n/a      Cardiovascular screening blood tests (lipids q5y) ASSESSMENT/PLAN: today, FASTING      Diabetes screening tests ASSESSMENT/PLAN: today   7.) Summary: -risk factors and conditions per above assessment were discussed and treatment, recommendations and referrals were offered per documentation above and orders and patient instructions.  Medicare annual wellness visit, subsequent -see note, scanned forms, orders and instructions  Hyperlipemia - Plan: Lipid Panel  Essential hypertension - Plan: Basic metabolic panel  Pericardial effusion - stable, sees cardiologist, no symptoms  Cerebral microvascular disease - stable  Alzheimer's dementia without behavioral disturbance, unspecified timing of dementia onset, improved on medication per report, managed by her neurologist  Recurrent major depressive disorder, in full remission ([place]) -cont lexapro, mood good  GAD (generalized anxiety disorder)  Edema of right lower extremity - Plan: LE VENOUS -suspect venous insufficiency, but with sig acute asymmetry and LE will check duplex to r/o DVT. Discussed options if + and they would prefer new oral anticoagulant (discussed risks) for many reasons and to reduce burden to her.  Patient Instructions  BEFORE YOU LEAVE: -labs -[place] date/time -follow up in 4 months  Get the [place] for your leg swelling  Elevate legs 30 minutes twice daily  Vit D3 800 IU daily  Get eye exam  We recommend the  following healthy lifestyle measures: - eat a healthy whole foods diet consisting of regular small meals composed of vegetables, fruits, beans, nuts, seeds, healthy meats such as white chicken and fish and whole grains.  - avoid sweets, white starchy foods, fried foods, fast food, processed foods, sodas, red meet and other fattening foods.  - get a least 150-300 minutes of aerobic exercise per week.

## 2015-11-07 NOTE — Patient Instructions (Signed)
BEFORE YOU LEAVE: -labs -[place] date/time -follow up in 4 months  Get the [place] for your leg swelling  Elevate legs 30 minutes twice daily  Vit D3 800 IU daily  Get eye exam  We recommend the following healthy lifestyle measures: - eat a healthy whole foods diet consisting of regular small meals composed of vegetables, fruits, beans, nuts, seeds, healthy meats such as white chicken and fish and whole grains.  - avoid sweets, white starchy foods, fried foods, fast food, processed foods, sodas, red meet and other fattening foods.  - get a least 150-300 minutes of aerobic exercise per week.

## 2015-11-07 NOTE — Progress Notes (Signed)
Pre visit review using our clinic review tool, if applicable. No additional management support is needed unless otherwise documented below in the visit note. 

## 2015-11-07 NOTE — Telephone Encounter (Signed)
Missy called from [facility] stating the doppler study of the right lower extremity was negative for DVT.

## 2015-11-07 NOTE — Telephone Encounter (Signed)
I called the pts daughter-in-law Katharine Look and left a detailed message with this information per Dr Maudie Mercury and to have her be sure to elevate the legs and watch her salt intake.

## 2015-11-24 ENCOUNTER — Other Ambulatory Visit: Payer: Self-pay | Admitting: Family Medicine

## 2015-11-24 DIAGNOSIS — F39 Unspecified mood [affective] disorder: Secondary | ICD-10-CM

## 2015-11-24 MED ORDER — ESCITALOPRAM OXALATE 5 MG PO TABS: 5.0000 mg | ORAL_TABLET | Freq: Every day | ORAL | Status: DC

## 2015-12-09 ENCOUNTER — Telehealth: Payer: Self-pay | Admitting: Neurology

## 2015-12-09 NOTE — Telephone Encounter (Signed)
Daughter called and would like to know if there nurse can give them information about dementia patients, she would like info on Adult day care, in home care ect. Assisted living, Nursing homes. She says her mother is not doing very well. May call [phone] Shirlean Mylar.

## 2015-12-10 NOTE — Telephone Encounter (Signed)
I called the patient's daughter and left a voicemail asking her to call me back.

## 2015-12-10 NOTE — Telephone Encounter (Signed)
Dayton Scrape returned [place] call, please call [phone].

## 2015-12-11 NOTE — Telephone Encounter (Signed)
I called Pamela Choi. She wanted to check out adult day care options first. I recommended looking up [facility]. I also gave her their number. She will call back if she decides that they would like to take a route other than adult day care.

## 2016-01-22 ENCOUNTER — Other Ambulatory Visit: Payer: Self-pay | Admitting: Family Medicine

## 2016-01-28 ENCOUNTER — Ambulatory Visit (INDEPENDENT_AMBULATORY_CARE_PROVIDER_SITE_OTHER): Payer: Medicare Other | Admitting: Podiatry

## 2016-01-28 DIAGNOSIS — M79673 Pain in unspecified foot: Secondary | ICD-10-CM

## 2016-01-28 DIAGNOSIS — L84 Corns and callosities: Secondary | ICD-10-CM | POA: Diagnosis not present

## 2016-01-28 DIAGNOSIS — B351 Tinea unguium: Secondary | ICD-10-CM | POA: Diagnosis not present

## 2016-01-28 NOTE — Progress Notes (Signed)
Subjective:     Patient ID: Pamela Choi, female   DOB: 1930/06/21, 82 y.o.   MRN: [id number]  HPI patient states that her nails are really bothering her with thickness and she has a painful corn on the bottom of the right foot with caregiver today   Review of Systems     Objective:   Physical Exam  neurovascular status intact with thick yellow brittle nailbeds 1-5 both feet and significant lesion plantar aspect right with pain of the nailbeds 1-5 both feet    Assessment:      mycotic nail infection with pain 1-5 both feet and lesion plantar aspect right painful    Plan:      debris painful mycotic nail infections 1-5 both feet and lesion plantar right with no iatrogenic bleeding noted

## 2016-02-14 ENCOUNTER — Other Ambulatory Visit: Payer: Self-pay | Admitting: Family Medicine

## 2016-03-04 ENCOUNTER — Ambulatory Visit (INDEPENDENT_AMBULATORY_CARE_PROVIDER_SITE_OTHER): Payer: Medicare Other | Admitting: Cardiovascular Disease

## 2016-03-04 ENCOUNTER — Encounter: Payer: Self-pay | Admitting: Cardiovascular Disease

## 2016-03-04 VITALS — BP 110/64 | HR 66 | Ht 64.0 in | Wt 158.0 lb

## 2016-03-04 DIAGNOSIS — I309 Acute pericarditis, unspecified: Secondary | ICD-10-CM | POA: Diagnosis not present

## 2016-03-04 DIAGNOSIS — I10 Essential (primary) hypertension: Secondary | ICD-10-CM | POA: Diagnosis not present

## 2016-03-04 NOTE — Patient Instructions (Signed)
Medication Instructions:  None  Labwork: None  Testing/Procedures: None  Follow-Up: Your physician recommends that you schedule a follow-up appointment as needed with Dr. Nahser.    Any Other Special Instructions Will Be Listed Below (If Applicable).     If you need a refill on your cardiac medications before your next appointment, please call your pharmacy.   

## 2016-03-04 NOTE — Progress Notes (Signed)
Patient Name: Pamela Choi Date of Encounter: 03/04/2016  Primary Care Provider:  Lucretia Kern., DO Primary Cardiologist:  Joaquim Nam, MD   Chief Complaint  82 year old female status post recent admission for pericarditis and pericardial effusion who presents for follow-up.   HPI  83 y/o female with a h/o HTN, HL, memory loss, and remote right breast cancer, who was in her usual state of health until 07/17/2015, when she began to experience intermittent pleuritic chest discomfort that was worse with deep breathing and lying down. There were no associated symptoms. Due to ongoing symptoms, she presented to the [facility] ED on 7/22, where she was hemodynamically stable. ECG was notable for inferior and anterolateral T wave inversion. Troponin was normal. D dimer was elevated @ 3.21. CTA of the chest was performed and was negative for PE but did show a small to moderate pericardial effusion.Following admission, troponins remained normal. She was placed on colchicine, nsaid, and PPI therapy with improvement in chest pain symptoms. A 2D echocardiogram was performed on 7/23, and showed normal LV function with a mild to moderate circumferential pericardial effusion. There was question of a possible disruption of the intima of the ascending aorta in the setting of mild dilatation, measuring 4 cm. Previous CTA of the chest was reviewed with radiology and recommendation was made for CTA of the chest/Aorta. This was performed on 7/22 and did not show any evidence of aortic dissection. She was subsequently discharged on ibuprofen and colchicine and arrangements were made for follow-up office visit and echocardiogram today. Since her discharge, she has not been complaining of any chest discomfort or dyspnea. She denies PND, orthopnea, dizziness, syncope, edema, or early satiety. She does have memory deficits and her daughter is here to help with the history.  Sept. 8, 2016  Doing well.  No CP ,    Having some diarrhea  - likely associated with the colchicine , not all that bad Overall doing very well.   March 04, 2016:  Is doing much better.  No further episodes of CP .  Has some mild joint swelling   Past Medical History   Past Medical History  Diagnosis Date  . Hyperlipidemia   . Hypertension   . Allergy   . Plantar wart of right foot   . Osteopenia   . Chicken pox   . CARCINOMA, INFILTRATING DUCTAL, RIGHT BREAST 11/27/2003    Annotation: grade2, ER/PR and Her2-/neu positive status post exemestane x5 years, completed March of 2010 Qualifier: History of  By: Norma Fredrickson MD, Larena Glassman     . TOBACCO ABUSE, HX OF 11/08/2006    Qualifier: Diagnosis of  By: Linden Dolin MD, Marjory Lies    . ANKLE SPRAIN, RIGHT 05/10/2007    Qualifier: Diagnosis of  By: Norma Fredrickson MD, Larena Glassman    . Pericarditis     a. 06/2015  . Pericardial effusion     a. 06/2015 Echo: EF 55%, mild LVH, mild AI, mild dil of Asc Ao (20m, 386m@ root), nl RV fxn, mild TR, small to mod circumferential pericardial effusion.  . Mild dementia     eval with neurology in 2014, mod SVD, declined tx  . Anxiety and depression    Past Surgical History  Procedure Laterality Date  . Breast surgery    . Hammer toe surgery      Right foot  . Bunionectomy      R foot    Allergies  Allergies  Allergen Reactions  . Bee Venom Anaphylaxis  Home Medications  Prior to Admission medications   Medication Sig Start Date End Date Taking? Authorizing Provider  amLODipine (NORVASC) 5 MG tablet Take 1 tablet (5 mg total) by mouth daily. 11/25/14  Yes Lucretia Kern, DO  atenolol (TENORMIN) 50 MG tablet Take 1 tablet (50 mg total) by mouth daily. 11/25/14  Yes Lucretia Kern, DO  cetirizine (ZYRTEC) 10 MG tablet Take 1 tablet (10 mg total) by mouth daily. 08/14/12  Yes Pedro Earls, MD  colchicine 0.6 MG tablet Take 1 tablet (0.6 mg total) by mouth 2 (two) times daily. 07/20/15  Yes Rogelia Mire, NP  ibuprofen (ADVIL,MOTRIN) 400 MG  tablet Take 1 tablet (400 mg total) by mouth every 8 (eight) hours. 07/20/15  Yes Rogelia Mire, NP  pantoprazole (PROTONIX) 40 MG tablet Take 1 tablet (40 mg total) by mouth daily. 07/20/15  Yes Rogelia Mire, NP  pravastatin (PRAVACHOL) 40 MG tablet Take 1 tablet (40 mg total) by mouth daily. 11/25/14  Yes Lucretia Kern, DO    Review of Systems  She denies chest pain, palpitations, dyspnea, pnd, orthopnea, n, v, dizziness, syncope, edema, weight gain, or early satiety.  All other systems reviewed and are otherwise negative except as noted above.  Physical Exam  VS:  BP 110/64 mmHg  Pulse 66  Ht _0  (1.626 m)  Wt 158 lb (71.668 kg)  BMI 27.11 kg/m2 , BMI Body mass index is 27.11 kg/(m^2). GEN: Well nourished, well developed, in no acute distress. HEENT: normal. Neck: Supple, no JVD, carotid bruits, or masses. Cardiac: RRR, 2/6 systolic murmur at the right upper sternal border, soft intermittent rub at the apex. No clubbing, cyanosis. Trace bilateral lower extremity edema.  Radials/DP/PT 2+ and equal bilaterally.  Respiratory:  Respirations regular and unlabored, clear to auscultation bilaterally. GI: Soft, nontender, nondistended, BS + x 4. MS: no deformity or atrophy. Skin: warm and dry, no rash. Neuro:  Strength and sensation are intact. Psych: Normal affect.  Accessory Clinical Findings  ECG - regular sinus rhythm, 66, left axis, inferior and anterolateral T inversion. No acute ST or T changes. No changes from previous tracing  Assessment & Plan  1.  Acute pericarditis/pericardial effusion:  Has been on colchicine and motrin  Until several months ago. No recurrent symptoms    Will see  as needed.   2. Essential hypertension:stable  3. Hyperlipidemia: Continue statin therapy.  Managed by her medical doctor   Will see as needed.    Nahser, Wonda Cheng, NP 03/04/2016, 11:34 AM

## 2016-03-08 ENCOUNTER — Ambulatory Visit (INDEPENDENT_AMBULATORY_CARE_PROVIDER_SITE_OTHER): Payer: Medicare Other | Admitting: Family Medicine

## 2016-03-08 ENCOUNTER — Encounter: Payer: Self-pay | Admitting: Family Medicine

## 2016-03-08 VITALS — BP 120/80 | HR 68 | Temp 98.3°F | Ht 64.0 in | Wt 158.3 lb

## 2016-03-08 DIAGNOSIS — I10 Essential (primary) hypertension: Secondary | ICD-10-CM

## 2016-03-08 DIAGNOSIS — R413 Other amnesia: Secondary | ICD-10-CM | POA: Diagnosis not present

## 2016-03-08 DIAGNOSIS — E785 Hyperlipidemia, unspecified: Secondary | ICD-10-CM | POA: Diagnosis not present

## 2016-03-08 NOTE — Progress Notes (Signed)
HPI:  Pamela Choi is a pleasant frail elderly woman with a complicated medical history here for routine follow up:  Depression and anxiety: -much improved since starting very low dose lexapro in 2016 -daughter and pt report she is doing well -denies depression, SI  HTN/HLD: -meds: norvasc, atenolol, pravastatin -denies: CP, SOB, DOE, leg cramps -stable  -hx LE edema  Memory Loss: -seeing neurology, had MRI with microvascular dz - ASA was advised, but she doesn't want to take this -now on aricept per review of notes -denies worsening memory, HA, weakness -lives alone, but family members rotate coming 3x per day for meals and meds and feels she is safe - has gained weight with the regular meals, has good appetite  Pericarditis/pericardial effusion: -hospitalized 06/2015 for this -now resolved -cardiology signed off -denies CP, SOB, DOE, palpitations  ROS: See pertinent positives and negatives per HPI.  Past Medical History  Diagnosis Date  . Hyperlipidemia   . Hypertension   . Allergy   . Plantar wart of right foot   . Osteopenia   . Chicken pox   . CARCINOMA, INFILTRATING DUCTAL, RIGHT BREAST 11/27/2003    Annotation: grade2, ER/PR and Her2-/neu positive status post exemestane x5 years, completed March of 2010 Qualifier: History of  By: Norma Fredrickson MD, Larena Glassman     . TOBACCO ABUSE, HX OF 11/08/2006    Qualifier: Diagnosis of  By: Linden Dolin MD, Marjory Lies    . ANKLE SPRAIN, RIGHT 05/10/2007    Qualifier: Diagnosis of  By: Norma Fredrickson MD, Larena Glassman    . Pericarditis     a. 06/2015  . Pericardial effusion     a. 06/2015 Echo: EF 55%, mild LVH, mild AI, mild dil of Asc Ao (52m, 394m@ root), nl RV fxn, mild TR, small to mod circumferential pericardial effusion.  . Mild dementia     eval with neurology in 2014, mod SVD, declined tx  . Anxiety and depression   . Acute pericarditis 07/20/2015    Past Surgical History  Procedure Laterality Date  . Breast surgery    . Hammer toe  surgery      Right foot  . Bunionectomy      R foot    Family History  Problem Relation Age of Onset  . Diabetes Mother   . Hypertension Mother   . Cancer Brother   . Heart attack Brother   . Stroke Neg Hx     Social History   Social History  . Marital Status: Widowed    Spouse Name: N/A  . Number of Children: 8  . Years of Education: HS   Occupational History  . Retired    Social History Main Topics  . Smoking status: Former Smoker    Quit date: 08/02/2003  . Smokeless tobacco: None  . Alcohol Use: No  . Drug Use: No  . Sexual Activity: Not Asked   Other Topics Concern  . None   Social History Narrative   Patient does not drink caffeine.   Patient is left handed.      Current outpatient prescriptions:  .  amLODipine (NORVASC) 5 MG tablet, TAKE 1 TABLET BY MOUTH DAILY, Disp: 90 tablet, Rfl: 1 .  atenolol (TENORMIN) 50 MG tablet, TAKE 1 TABLET BY MOUTH DAILY, Disp: 90 tablet, Rfl: 1 .  cetirizine (ZYRTEC) 10 MG tablet, Take 1 tablet (10 mg total) by mouth daily., Disp: 30 tablet, Rfl: 6 .  Cholecalciferol (VITAMIN D3) 3000 units TABS, Take 1 tablet by mouth daily.,  Disp: , Rfl:  .  donepezil (ARICEPT) 10 MG tablet, Take 1 tablet (10 mg total) by mouth at bedtime., Disp: 90 tablet, Rfl: 1 .  escitalopram (LEXAPRO) 5 MG tablet, TAKE 1 TABLET(5 MG) BY MOUTH DAILY, Disp: 30 tablet, Rfl: 2 .  pravastatin (PRAVACHOL) 40 MG tablet, TAKE 1 TABLET BY MOUTH DAILY, Disp: 90 tablet, Rfl: 1  EXAM:  Filed Vitals:   03/08/16 1343  BP: 120/80  Pulse: 68  Temp: 98.3 F (36.8 C)    Body mass index is 27.16 kg/(m^2).  GENERAL: vitals reviewed and listed above, alert, oriented, appears well hydrated and in no acute distress  HEENT: atraumatic, conjunttiva clear, no obvious abnormalities on inspection of external nose and ears, small amount of soft wax in both ears  NECK: no obvious masses on inspection  LUNGS: clear to auscultation bilaterally, no wheezes, rales or  rhonchi, good air movement  CV: HRRR, no peripheral edema  MS: moves all extremities without noticeable abnormality  PSYCH: pleasant and cooperative, no obvious depression or anxiety  ASSESSMENT AND PLAN:  Discussed the following assessment and plan:  Essential hypertension  Hyperlipemia  Memory loss  lifestyle recs discussed with daughter - she is interested in healthy breakfast and lunch choices - discussed -pt in a good mood today and seems to be doing well -continue current medications -Patient advised to return or notify a doctor immediately if symptoms worsen or persist or new concerns arise.  Patient Instructions  BEFORE YOU LEAVE: -schedule follow up in 4 months  We recommend the following healthy lifestyle measures: - eat a healthy whole foods diet consisting of regular small meals composed of vegetables, fruits, beans, nuts, seeds, healthy meats such as white chicken and fish and whole grains.  - avoid sweets, white starchy foods, fried foods, fast food, processed foods, sodas, red meet and other fattening foods.  - get a least 150-300 minutes of aerobic exercise per week.       Pamela Benton R.

## 2016-03-08 NOTE — Progress Notes (Signed)
Pre visit review using our clinic review tool, if applicable. No additional management support is needed unless otherwise documented below in the visit note. 

## 2016-03-08 NOTE — Patient Instructions (Signed)
BEFORE YOU LEAVE: -schedule follow up in 4 months  We recommend the following healthy lifestyle measures: - eat a healthy whole foods diet consisting of regular small meals composed of vegetables, fruits, beans, nuts, seeds, healthy meats such as white chicken and fish and whole grains.  - avoid sweets, white starchy foods, fried foods, fast food, processed foods, sodas, red meet and other fattening foods.  - get a least 150-300 minutes of aerobic exercise per week.   

## 2016-04-16 ENCOUNTER — Other Ambulatory Visit: Payer: Self-pay

## 2016-04-16 ENCOUNTER — Other Ambulatory Visit: Payer: Self-pay | Admitting: Neurology

## 2016-04-16 DIAGNOSIS — Z1231 Encounter for screening mammogram for malignant neoplasm of breast: Secondary | ICD-10-CM

## 2016-05-10 ENCOUNTER — Other Ambulatory Visit: Payer: Self-pay | Admitting: Family Medicine

## 2016-05-12 ENCOUNTER — Encounter: Payer: Self-pay | Admitting: Podiatry

## 2016-05-12 ENCOUNTER — Ambulatory Visit (INDEPENDENT_AMBULATORY_CARE_PROVIDER_SITE_OTHER): Payer: Medicare Other | Admitting: Podiatry

## 2016-05-12 DIAGNOSIS — B351 Tinea unguium: Secondary | ICD-10-CM | POA: Diagnosis not present

## 2016-05-12 DIAGNOSIS — M79674 Pain in right toe(s): Secondary | ICD-10-CM | POA: Diagnosis not present

## 2016-05-12 DIAGNOSIS — M79675 Pain in left toe(s): Secondary | ICD-10-CM | POA: Diagnosis not present

## 2016-05-12 DIAGNOSIS — Q828 Other specified congenital malformations of skin: Secondary | ICD-10-CM | POA: Diagnosis not present

## 2016-05-12 NOTE — Progress Notes (Signed)
Patient ID: Pamela Choi, female   DOB: Nov 30, 1930, 82 y.o.   MRN: [id number]  Subjective: This patient presents again today complaining of uncomfortable toenails when walking wearing shoes and requests toenail debridement. Also, patient complaining of a painful skin lesion on the plantar aspect of the right foot Patient's daughter-in-law present and treatment room  Objective: Patient appears orientated 3 Patient is very reactive to instrumentation when debrided nails and her daughter-in-law help to stabilize patient's foot and ankle during the skin a nail debridement  DP and PT pulses 2/4 bilaterally Capillary reflex immediate bilaterally Sensation to 10 g monofilament wire intact 5/5 bilaterally Vibratory sensation intact bilaterally Ankle reflex equal and reactive bilaterally The toenails are thickened, elongated, brittle, discolored and tender to direct palpation 6-10 Nucleated plantar lesion third MPJ right Surgical scar dorsal right first MPJ Hallux limitus right  Assessment: Symptomatic onychomycoses 6-10 Porokeratosis 1  Plan: Toenails 6-10 are debrided mechanically and electrically found any bleeding Debride porokeratosis 1 without any bleeding  Reappoint 3 months

## 2016-05-20 ENCOUNTER — Other Ambulatory Visit: Payer: Self-pay | Admitting: Family Medicine

## 2016-05-20 ENCOUNTER — Ambulatory Visit
Admission: RE | Admit: 2016-05-20 | Discharge: 2016-05-20 | Disposition: A | Discharge status: Home / Self Care | Payer: Medicare Other | Source: Ambulatory Visit

## 2016-05-20 DIAGNOSIS — Z1231 Encounter for screening mammogram for malignant neoplasm of breast: Secondary | ICD-10-CM

## 2016-05-26 ENCOUNTER — Other Ambulatory Visit: Payer: Self-pay | Admitting: Family Medicine

## 2016-07-04 ENCOUNTER — Other Ambulatory Visit: Payer: Self-pay | Admitting: Neurology

## 2016-07-29 ENCOUNTER — Other Ambulatory Visit: Payer: Self-pay | Admitting: Family Medicine

## 2016-08-18 ENCOUNTER — Encounter: Payer: Self-pay | Admitting: Podiatry

## 2016-08-18 ENCOUNTER — Ambulatory Visit (INDEPENDENT_AMBULATORY_CARE_PROVIDER_SITE_OTHER): Payer: Medicare Other | Admitting: Podiatry

## 2016-08-18 DIAGNOSIS — M79675 Pain in left toe(s): Secondary | ICD-10-CM | POA: Diagnosis not present

## 2016-08-18 DIAGNOSIS — Q828 Other specified congenital malformations of skin: Secondary | ICD-10-CM

## 2016-08-18 DIAGNOSIS — M79674 Pain in right toe(s): Secondary | ICD-10-CM | POA: Diagnosis not present

## 2016-08-18 DIAGNOSIS — B351 Tinea unguium: Secondary | ICD-10-CM

## 2016-08-18 NOTE — Progress Notes (Signed)
Patient ID: Pamela Choi, female   DOB: Feb 07, 1930, 82 y.o.   MRN: [id number]    Subjective: This patient presents again today complaining of uncomfortable toenails when walking wearing shoes and requests toenail debridement. Also, patient complaining of a painful skin lesion on the plantar aspect of the right foot Patient's daughter-in-law present and treatment room  Objective: Patient appears orientated 3 Patient is very reactive to instrumentation when debrided nails and her daughter-in-law help to stabilize patient's foot and ankle during the skin a nail debridement  DP and PT pulses 2/4 bilaterally Capillary reflex immediate bilaterally Sensation to 10 g monofilament wire intact 5/5 bilaterally Vibratory sensation intact bilaterally Ankle reflex equal and reactive bilaterally The toenails are thickened, elongated, brittle, discolored and tender to direct palpation 6-10 Nucleated plantar lesion third MPJ right Surgical scar dorsal right first MPJ Hallux limitus right  Assessment: Symptomatic onychomycoses 6-10 Porokeratosis 1  Plan: Toenails 6-10 are debrided mechanically and electrically found any bleeding Debride porokeratosis 1 without any bleeding  Reappoint 3 months

## 2016-08-31 ENCOUNTER — Other Ambulatory Visit: Payer: Self-pay | Admitting: Family Medicine

## 2016-09-23 ENCOUNTER — Ambulatory Visit (INDEPENDENT_AMBULATORY_CARE_PROVIDER_SITE_OTHER): Payer: Medicare Other | Admitting: *Deleted

## 2016-09-23 DIAGNOSIS — Z23 Encounter for immunization: Secondary | ICD-10-CM | POA: Diagnosis not present

## 2016-10-04 ENCOUNTER — Other Ambulatory Visit: Payer: Self-pay | Admitting: Family Medicine

## 2016-10-11 ENCOUNTER — Other Ambulatory Visit: Payer: Self-pay | Admitting: Neurology

## 2016-10-14 ENCOUNTER — Telehealth: Payer: Self-pay | Admitting: Neurology

## 2016-10-14 MED ORDER — DONEPEZIL HCL 10 MG PO TABS: ORAL_TABLET | ORAL | 0 refills | Status: DC

## 2016-10-14 NOTE — Addendum Note (Signed)
Addended by: Monte Fantasia on: 10/14/2016 05:25 PM   Modules accepted: Orders

## 2016-10-14 NOTE — Telephone Encounter (Signed)
Spoke to pts dtr-in-law. 1 yr f/u appt scheduled for Nov 1st. 30 day refill of Aricept e-scribed to pharmacy.

## 2016-10-14 NOTE — Telephone Encounter (Signed)
Pt's daughter-in-law called to schedule appt. Last seen was 9/16. She said pt will run out medication in the next few days and Dr W is booked thru November.  She said the pharmacy advised an appt must be made. Please call to work her in. Thank you

## 2016-10-27 ENCOUNTER — Encounter: Payer: Self-pay | Admitting: Neurology

## 2016-10-27 ENCOUNTER — Ambulatory Visit (INDEPENDENT_AMBULATORY_CARE_PROVIDER_SITE_OTHER): Payer: Medicare Other | Admitting: Neurology

## 2016-10-27 VITALS — BP 147/83 | HR 66 | Ht 64.0 in | Wt 163.5 lb

## 2016-10-27 DIAGNOSIS — R413 Other amnesia: Secondary | ICD-10-CM | POA: Diagnosis not present

## 2016-10-27 MED ORDER — DONEPEZIL HCL 10 MG PO TABS: ORAL_TABLET | ORAL | 3 refills | Status: DC

## 2016-10-27 NOTE — Progress Notes (Signed)
Reason for visit: Memory disturbance  Pamela Choi is an 82 y.o. female  History of present illness:  Pamela Choi is an 82 year old left-handed black female with a history of a progressive memory disturbance. The patient currently lives in her own home, the family checks on her 3 times a day, brings her meals. The patient watches TV or sleeps during the day, she does not wander or get outside the house. The patient has not had any falls or any other safety issues in the home environment. The patient does not operate a motor vehicle. She is on Aricept now, she is tolerating this medication. She returns for an evaluation.  Past Medical History:  Diagnosis Date  . Acute pericarditis 07/20/2015  . Allergy   . ANKLE SPRAIN, RIGHT 05/10/2007   Qualifier: Diagnosis of  By: Norma Fredrickson MD, Larena Glassman    . Anxiety and depression   . 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 Chicken pox   . Hyperlipidemia   . Hypertension   . Mild dementia    eval with neurology in 2014, mod SVD, declined tx  . Osteopenia   . Pericardial effusion    a. 06/2015 Echo: EF 55%, mild LVH, mild AI, mild dil of Asc Ao (69m, 371m@ root), nl RV fxn, mild TR, small to mod circumferential pericardial effusion.  . Pericarditis    a. 06/2015  . Plantar wart of right foot   . TOBACCO ABUSE, HX OF 11/08/2006   Qualifier: Diagnosis of  By: SiLinden DolinD, AaMarjory Lies    Past Surgical History:  Procedure Laterality Date  . BREAST SURGERY    . BUNIONECTOMY     R foot  . HAMMER TOE SURGERY     Right foot    Family History  Problem Relation Age of Onset  . Diabetes Mother   . Hypertension Mother   . Cancer Brother   . Heart attack Brother   . Stroke Brother   . Stroke Brother     Social history:  reports that she quit smoking about 13 years  ago. She has never used smokeless tobacco. She reports that she does not drink alcohol or use drugs.    Allergies  Allergen Reactions  . Bee Venom Anaphylaxis    Medications:  Prior to Admission medications   Medication Sig Start Date End Date Taking? Authorizing Provider  amLODipine (NORVASC) 5 MG tablet TAKE 1 TABLET BY MOUTH DAILY 05/20/16  Yes HaLucretia KernDO  aspirin EC 81 MG tablet Take 81 mg by mouth daily.   Yes Historical Provider, MD  atenolol (TENORMIN) 50 MG tablet TAKE 1 TABLET BY MOUTH DAILY 05/27/16  Yes HaLucretia KernDO  cetirizine (ZYRTEC) 10 MG tablet Take 1 tablet (10 mg total) by mouth daily. 08/14/12  Yes MePedro EarlsMD  Cholecalciferol (VITAMIN D3) 3000 units TABS Take 1 tablet by mouth daily.   Yes Historical Provider, MD  donepezil (ARICEPT) 10 MG tablet TAKE 1 TABLET(10 MG) BY MOUTH AT BEDTIME 10/14/16  Yes ChKathrynn DuckingMD  escitalopram (LEXAPRO) 5 MG tablet TAKE 1 TABLET(5 MG) BY MOUTH DAILY 10/04/16  Yes HaLucretia KernDO  pravastatin (PRAVACHOL) 40 MG tablet TAKE 1 TABLET BY MOUTH DAILY 07/29/16  Yes HaLucretia KernDO    ROS:  Out  of a complete 14 system review of symptoms, the patient complains only of the following symptoms, and all other reviewed systems are negative.  Ringing in the ears, runny nose Cough Leg swelling Cold intolerance Joint pain Depression, anxiety  Blood pressure (!) 147/83, pulse 66, height '5\' 4"'$  (1.626 m), weight 163 lb 8 oz (74.2 kg).  Physical Exam  General: The patient is alert and cooperative at the time of the examination.  Skin: 1+ edema at the ankles is noted bilaterally.   Neurologic Exam  Mental status: The patient is alert and oriented x 2 at the time of the examination (not oriented to date). The Mini-Mental Status Examination done today shows a total score 21/30.   Cranial nerves: Facial symmetry is present. Speech is normal, no aphasia or dysarthria is noted. Extraocular movements are full. Visual fields  are full.  Motor: The patient has good strength in all 4 extremities.  Sensory examination: Soft touch sensation is symmetric on the face, arms, and legs.  Coordination: The patient has good finger-nose-finger and heel-to-shin bilaterally.  Gait and station: The patient has a normal gait. Tandem gait is unsteady. Romberg is negative. No drift is seen.  Reflexes: Deep tendon reflexes are symmetric.   Assessment/Plan:  1. Progressive memory disturbance  The patient will continue the Aricept, she seems to tolerate well. The patient remains in the home environment for now, the family is offering an adequate level of supervision. No safety issues have been identified. The patient will follow-up through this office in one year, sooner if needed.  Jill Alexanders MD 10/27/2016 12:29 PM  [facility] [address] [place], [place] [id number]  Phone [phone] Fax [phone]

## 2016-11-01 ENCOUNTER — Other Ambulatory Visit: Payer: Self-pay | Admitting: Family Medicine

## 2016-11-17 ENCOUNTER — Ambulatory Visit (INDEPENDENT_AMBULATORY_CARE_PROVIDER_SITE_OTHER): Payer: Medicare Other | Admitting: Podiatry

## 2016-11-17 ENCOUNTER — Encounter: Payer: Self-pay | Admitting: Podiatry

## 2016-11-17 DIAGNOSIS — M79674 Pain in right toe(s): Secondary | ICD-10-CM | POA: Diagnosis not present

## 2016-11-17 DIAGNOSIS — B351 Tinea unguium: Secondary | ICD-10-CM | POA: Diagnosis not present

## 2016-11-17 DIAGNOSIS — M79675 Pain in left toe(s): Secondary | ICD-10-CM | POA: Diagnosis not present

## 2016-11-17 DIAGNOSIS — Q828 Other specified congenital malformations of skin: Secondary | ICD-10-CM | POA: Diagnosis not present

## 2016-11-18 NOTE — Progress Notes (Signed)
Patient ID: Pamela Choi, female   DOB: May 17, 1930, 82 y.o.   MRN: [id number]    Subjective: This patient presents again today complaining of uncomfortable toenails when walking wearing shoes and requests toenail debridement. Also, patient complaining of a painful skin lesion on the plantar aspect of the right foot Patient's daughter-in-law present and treatment room  Objective: Patient is confused and has difficulty responding to questioning Patient is very reactive to instrumentation when debrided nails and her daughter-in-law help to stabilize patient's foot and ankle during the skin a nail debridement  DP and PT pulses 2/4 bilaterally Capillary reflex immediate bilaterally Sensation to 10 g monofilament wire intact 5/5 bilaterally Vibratory sensation intact bilaterally Ankle reflex equal and reactive bilaterally The toenails are thickened, elongated, brittle, discolored and tender to direct palpation 6-10 Nucleated plantar lesion third MPJ right Surgical scar dorsal right first MPJ Hallux limitus right  Assessment: Symptomatic onychomycoses 6-10 Porokeratosis 1  Plan: Toenails 6-10 are debrided mechanically and electrically found any bleeding Debride porokeratosis 1 without any bleeding  Reappoint 3 months

## 2016-11-23 ENCOUNTER — Other Ambulatory Visit: Payer: Self-pay | Admitting: Family Medicine

## 2016-12-07 ENCOUNTER — Other Ambulatory Visit: Payer: Self-pay | Admitting: Family Medicine

## 2016-12-27 IMAGING — DX DG CHEST 2V
2 series · 2 of 2 positions shown · non-contrast
Comparison: None.

CLINICAL DATA: Acute chest pain.

EXAM:
CHEST  2 VIEW

[chest pa]
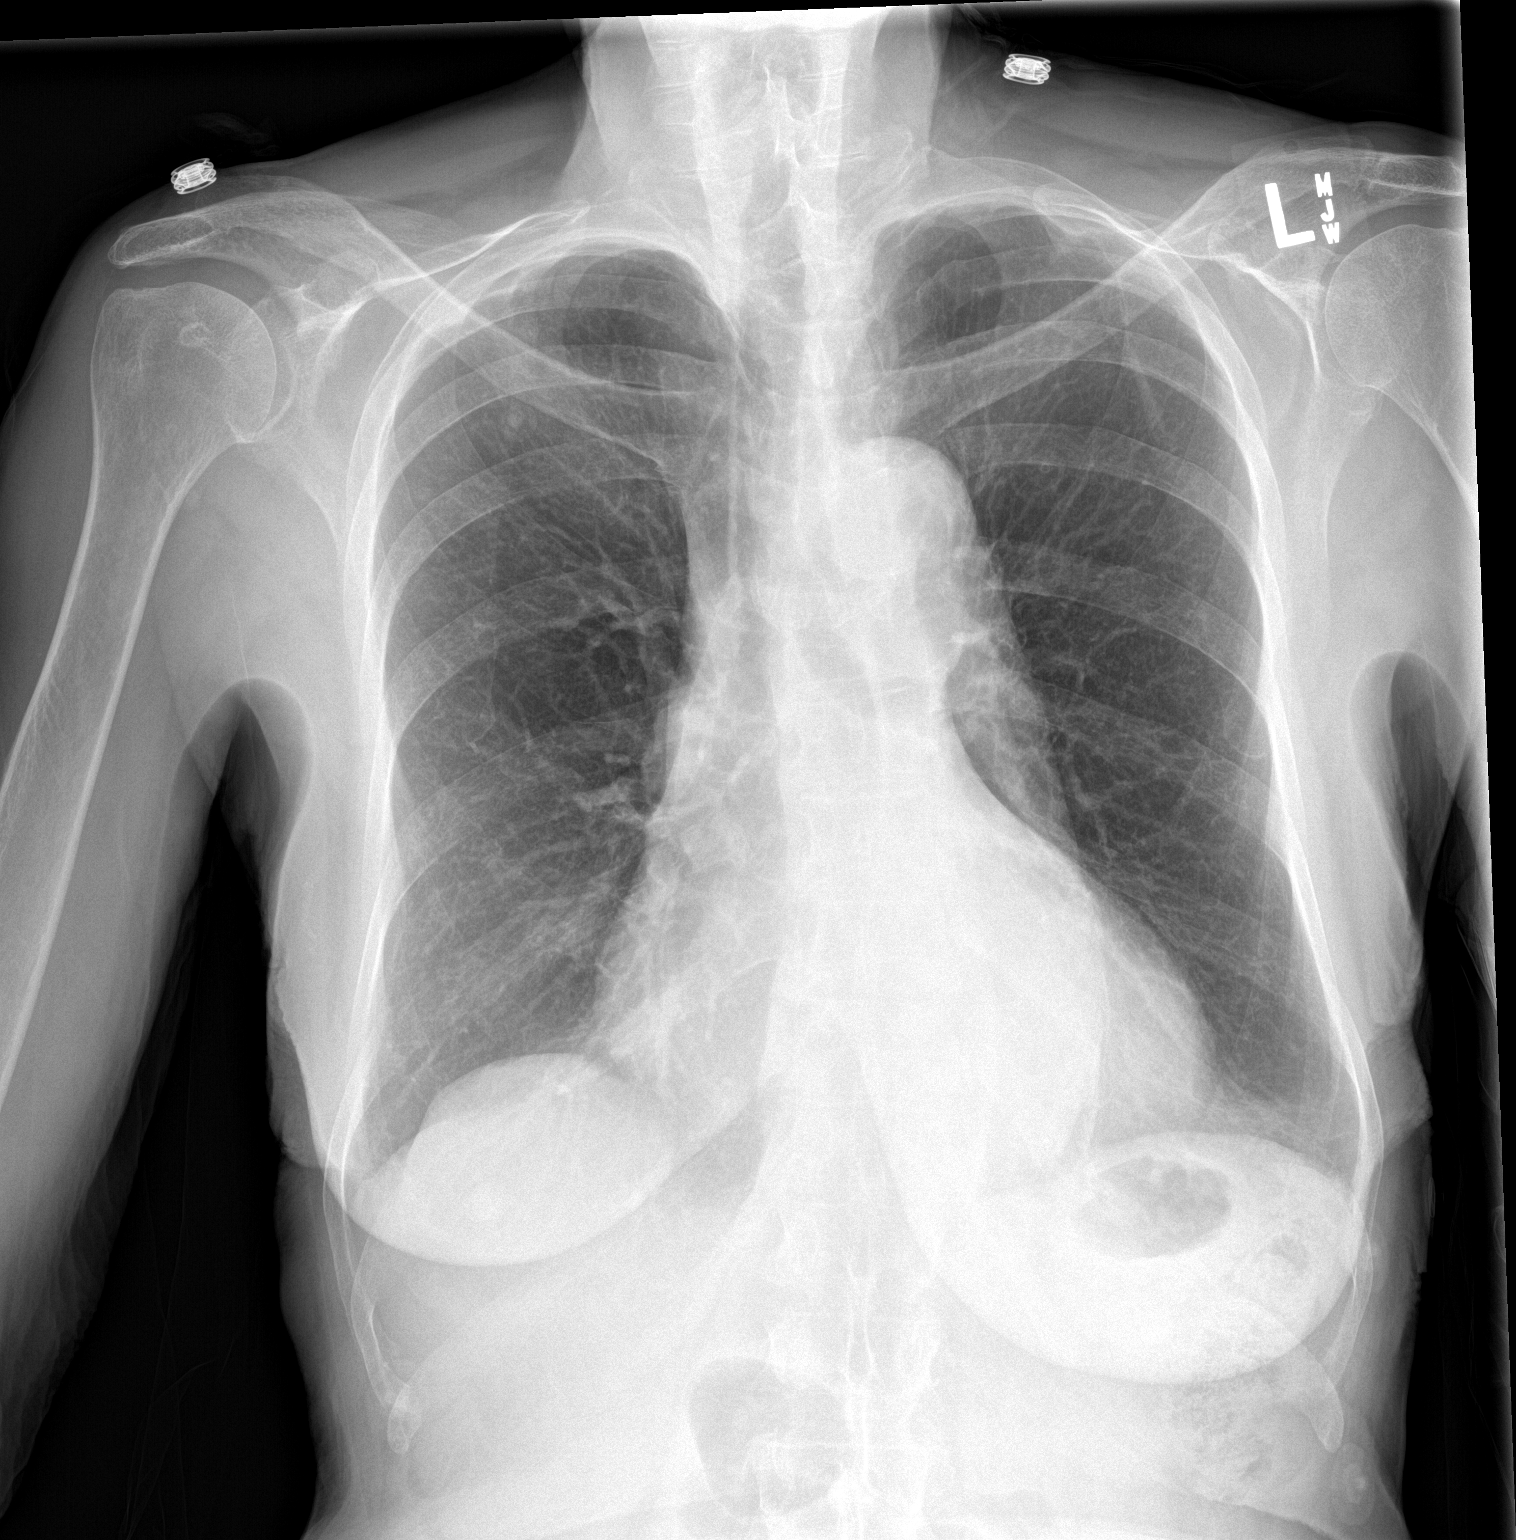

[chest lat]
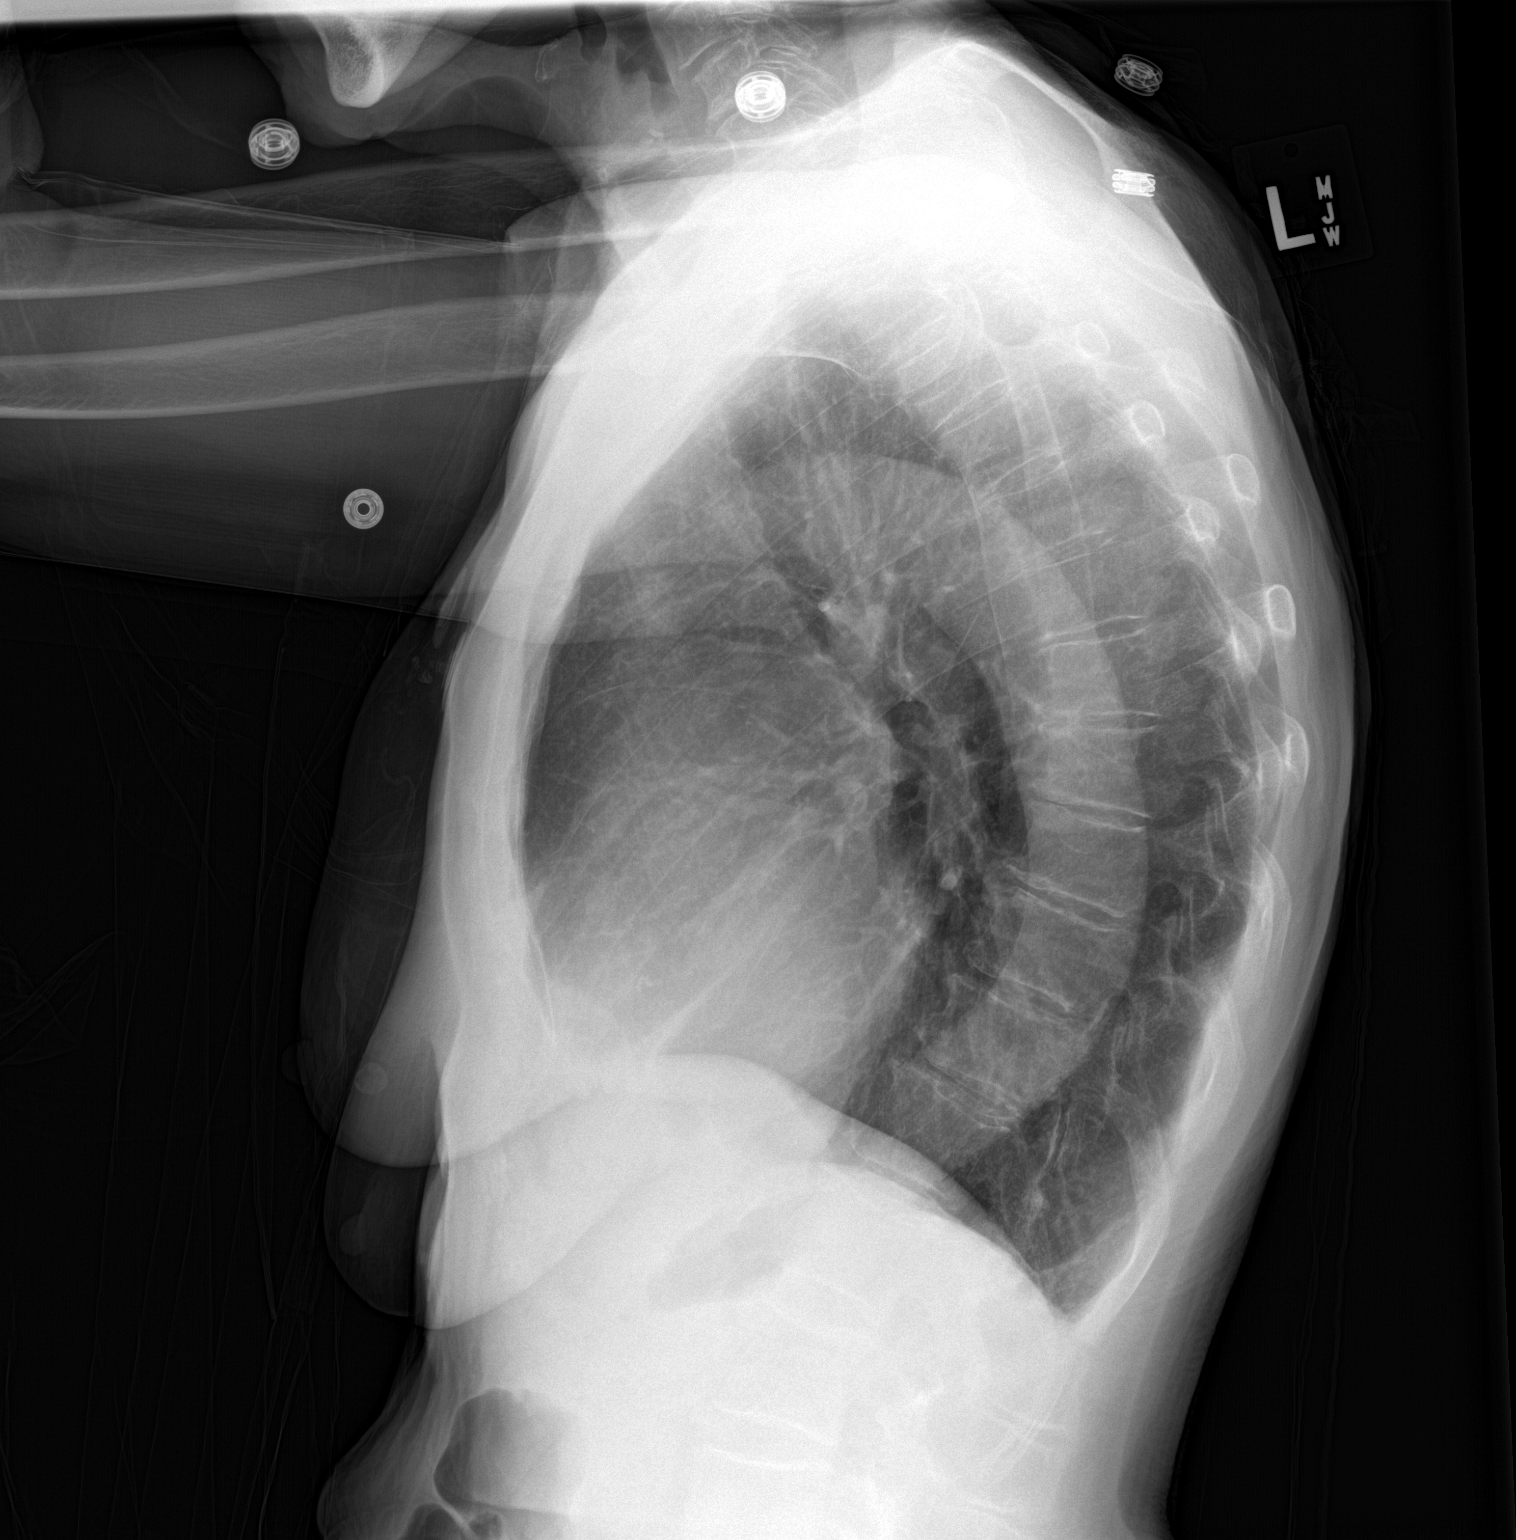

[2 of 2 positions shown; findings below may reference images not displayed]

FINDINGS: Mild cardiomegaly is noted. No pneumothorax or pleural effusion is
noted. No acute pulmonary disease is noted. Bony thorax is intact.
IMPRESSION: No active cardiopulmonary disease.

## 2016-12-29 NOTE — Progress Notes (Addendum)
HPI:  Pamela Choi is a pleasant 83 yo with a PMH significant for HTN, HLD, Depression, Anxiety and Memory Loss (seesing neurologist) here for follow up. Reports doing well. Daughter reports she is doing quite well on the Lexapro, and is in need of a refill of this. She does requests 90 day refills instead of 30 days. Reports her mood has been stable without any significant depression or anxiety since starting the Lexapro. Denies chest pain, shortness of breath, swelling, dizziness or falls. Due for Medicare Exam and labs.   ROS: See pertinent positives and negatives per HPI.  Past Medical History:  Diagnosis Date  . Acute pericarditis 07/20/2015  . Allergy   . ANKLE SPRAIN, RIGHT 05/10/2007   Qualifier: Diagnosis of  By: Norma Fredrickson MD, Larena Glassman    . Anxiety and depression   . 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 Chicken pox   . Hyperlipidemia   . Hypertension   . Mild dementia    eval with neurology in 2014, mod SVD, declined tx  . Osteopenia   . Pericardial effusion    a. 06/2015 Echo: EF 55%, mild LVH, mild AI, mild dil of Asc Ao (34m, 330m@ root), nl RV fxn, mild TR, small to mod circumferential pericardial effusion.  . Pericarditis    a. 06/2015  . Plantar wart of right foot   . TOBACCO ABUSE, HX OF 11/08/2006   Qualifier: Diagnosis of  By: SiLinden DolinD, AaMarjory Lies    Past Surgical History:  Procedure Laterality Date  . BREAST SURGERY    . BUNIONECTOMY     R foot  . HAMMER TOE SURGERY     Right foot    Family History  Problem Relation Age of Onset  . Diabetes Mother   . Hypertension Mother   . Cancer Brother   . Heart attack Brother   . Stroke Brother   . Stroke Brother     Social History   Social History  . Marital status: Widowed    Spouse name: N/A  . Number of children: 8  .  Years of education: HS   Occupational History  . Retired    Social History Main Topics  . Smoking status: Former Smoker    Quit date: 08/02/2003  . Smokeless tobacco: Never Used  . Alcohol use No  . Drug use: No  . Sexual activity: Not Asked   Other Topics Concern  . None   Social History Narrative   Patient does not drink caffeine.   Patient is left handed.      Current Outpatient Prescriptions:  .  amLODipine (NORVASC) 5 MG tablet, TAKE 1 TABLET BY MOUTH DAILY, Disp: 90 tablet, Rfl: 0 .  aspirin EC 81 MG tablet, Take 81 mg by mouth daily., Disp: , Rfl:  .  atenolol (TENORMIN) 50 MG tablet, TAKE 1 TABLET BY MOUTH DAILY, Disp: 90 tablet, Rfl: 1 .  cetirizine (ZYRTEC) 10 MG tablet, Take 1 tablet (10 mg total) by mouth daily., Disp: 30 tablet, Rfl: 6 .  Cholecalciferol (VITAMIN D3) 3000 units TABS, Take 1 tablet by mouth daily., Disp: , Rfl:  .  donepezil (ARICEPT) 10 MG tablet, TAKE 1 TABLET(10 MG) BY MOUTH AT BEDTIME, Disp: 90 tablet, Rfl: 3 .  escitalopram (LEXAPRO) 5 MG tablet, Take 1 tablet (5 mg  total) by mouth daily., Disp: 90 tablet, Rfl: 3 .  pravastatin (PRAVACHOL) 40 MG tablet, TAKE 1 TABLET BY MOUTH DAILY, Disp: 90 tablet, Rfl: 0  EXAM:  Vitals:   12/30/16 1056  BP: 100/60  Pulse: 65  Temp: 98.5 F (36.9 C)    Body mass index is 27.65 kg/m.  GENERAL: vitals reviewed and listed above, alert, oriented, appears well hydrated and in no acute distress  HEENT: atraumatic, conjunttiva clear, no obvious abnormalities on inspection of external nose and ears  NECK: no obvious masses on inspection  LUNGS: clear to auscultation bilaterally, no wheezes, rales or rhonchi, good air movement  CV: HRRR, no peripheral edema  MS: moves all extremities without noticeable abnormality  PSYCH: pleasant and cooperative, no obvious depression or anxiety  ASSESSMENT AND PLAN:  Discussed the following assessment and plan:  Essential hypertension  Recurrent major  depressive disorder, in full remission (HCC)  Hyperlipidemia, unspecified hyperlipidemia type  Memory loss  -Refills sent -Blood pressure stable, on low in but no symptoms - will continue current therapy for now -Weight is stable -Daughter reports weight is stable and now on a healthy diet -Medicare exam with Manuela Schwartz in 2 months, follow-up with me in 5 months -she prefers to do labs when returns for wellness visit - orders placed -Patient advised to return or notify a doctor immediately if symptoms worsen or persist or new concerns arise.  Patient Instructions  BEFORE YOU LEAVE: -follow up: 1) Medicare exam with Manuela Schwartz in 2 months 2) Follow up with Dr. Maudie Mercury in 5 months   We recommend the following healthy lifestyle for LIFE: 1) Small portions.   Tip: eat off of a salad plate instead of a dinner plate.  Tip: if you need more or a snack choose fruits, veggies and/or a handful of nuts or seeds.  2) Eat a healthy clean diet.  * Tip: Avoid (less then 1 serving per week): processed foods, sweets, sweetened drinks, white starches (rice, flour, bread, potatoes, pasta, etc), red meat, fast foods, butter  *Tip: CHOOSE instead   * 5-9 servings per day of fresh or frozen fruits and vegetables (but not corn, potatoes, bananas, canned or dried fruit)   *nuts and seeds, beans   *olives and olive oil   *small portions of lean meats such as fish and white chicken    *small portions of whole grains  3)Get at least 150 minutes of sweaty aerobic exercise per week.  4)Reduce stress - consider counseling, meditation and relaxation to balance other aspects of your life.     Colin Benton R., DO

## 2016-12-30 ENCOUNTER — Encounter: Payer: Self-pay | Admitting: Family Medicine

## 2016-12-30 ENCOUNTER — Ambulatory Visit (INDEPENDENT_AMBULATORY_CARE_PROVIDER_SITE_OTHER): Payer: Medicare Other | Admitting: Family Medicine

## 2016-12-30 VITALS — BP 100/60 | HR 65 | Temp 98.5°F | Ht 64.0 in | Wt 161.1 lb

## 2016-12-30 DIAGNOSIS — E785 Hyperlipidemia, unspecified: Secondary | ICD-10-CM

## 2016-12-30 DIAGNOSIS — R413 Other amnesia: Secondary | ICD-10-CM | POA: Diagnosis not present

## 2016-12-30 DIAGNOSIS — F3342 Major depressive disorder, recurrent, in full remission: Secondary | ICD-10-CM | POA: Insufficient documentation

## 2016-12-30 DIAGNOSIS — I10 Essential (primary) hypertension: Secondary | ICD-10-CM | POA: Diagnosis not present

## 2016-12-30 MED ORDER — ESCITALOPRAM OXALATE 5 MG PO TABS: 5.0000 mg | ORAL_TABLET | Freq: Every day | ORAL | 3 refills | Status: DC

## 2016-12-30 NOTE — Progress Notes (Signed)
Pre visit review using our clinic review tool, if applicable. No additional management support is needed unless otherwise documented below in the visit note. 

## 2016-12-30 NOTE — Addendum Note (Signed)
Addended by: Lucretia Kern on: 12/30/2016 12:22 PM   Modules accepted: Orders

## 2016-12-30 NOTE — Patient Instructions (Signed)
BEFORE YOU LEAVE: -follow up: 1) Medicare exam with Manuela Schwartz in 2 months 2) Follow up with Dr. Maudie Mercury in 5 months   We recommend the following healthy lifestyle for LIFE: 1) Small portions.   Tip: eat off of a salad plate instead of a dinner plate.  Tip: if you need more or a snack choose fruits, veggies and/or a handful of nuts or seeds.  2) Eat a healthy clean diet.  * Tip: Avoid (less then 1 serving per week): processed foods, sweets, sweetened drinks, white starches (rice, flour, bread, potatoes, pasta, etc), red meat, fast foods, butter  *Tip: CHOOSE instead   * 5-9 servings per day of fresh or frozen fruits and vegetables (but not corn, potatoes, bananas, canned or dried fruit)   *nuts and seeds, beans   *olives and olive oil   *small portions of lean meats such as fish and white chicken    *small portions of whole grains  3)Get at least 150 minutes of sweaty aerobic exercise per week.  4)Reduce stress - consider counseling, meditation and relaxation to balance other aspects of your life.

## 2017-01-29 ENCOUNTER — Other Ambulatory Visit: Payer: Self-pay | Admitting: Family Medicine

## 2017-02-16 ENCOUNTER — Ambulatory Visit (INDEPENDENT_AMBULATORY_CARE_PROVIDER_SITE_OTHER): Payer: Medicare Other | Admitting: Podiatry

## 2017-02-16 ENCOUNTER — Encounter: Payer: Self-pay | Admitting: Podiatry

## 2017-02-16 VITALS — BP 136/74 | HR 69 | Resp 18

## 2017-02-16 DIAGNOSIS — M79675 Pain in left toe(s): Secondary | ICD-10-CM

## 2017-02-16 DIAGNOSIS — B351 Tinea unguium: Secondary | ICD-10-CM | POA: Diagnosis not present

## 2017-02-16 DIAGNOSIS — M79674 Pain in right toe(s): Secondary | ICD-10-CM | POA: Diagnosis not present

## 2017-02-16 DIAGNOSIS — Q828 Other specified congenital malformations of skin: Secondary | ICD-10-CM

## 2017-02-16 NOTE — Progress Notes (Signed)
Patient ID: Pamela Choi, female   DOB: October 30, 1930, 83 y.o.   MRN: [id number]    Subjective: This patient presents again today complaining of uncomfortable toenails when walking wearing shoes and requests toenail debridement. Also, patient complaining of a painful skin lesion on the plantar aspect of the right foot Patient's daughter-in-law present and treatment room  Objective: Patient is confused and has difficulty responding to questioning Patient is very reactive to instrumentation when debrided nails and her daughter-in-law help to stabilize patient's foot and ankle during the skin a nail debridement. The patient's daughter-in-law applied mild restraint during the debridement DP and PT pulses 2/4 bilaterally Capillary reflex immediate bilaterally Sensation to 10 g monofilament wire intact 5/5 bilaterally Vibratory sensation intact bilaterally Ankle reflex equal and reactive bilaterally The toenails are thickened, elongated, brittle, discolored and tender to direct palpation 6-10 Nucleated plantar lesion third MPJ right Surgical scar dorsal right first MPJ Hallux limitus right  Assessment: Symptomatic onychomycoses 6-10 Porokeratosis 1  Plan: Toenails 6-10 are debrided mechanically and electrically found any bleeding Debride porokeratosis 1 without any bleeding  Reappoint 3 months

## 2017-02-23 ENCOUNTER — Telehealth: Payer: Self-pay | Admitting: Family Medicine

## 2017-02-23 NOTE — Telephone Encounter (Signed)
pts daughter-in-law calling stating that the pt had catarat surgery on Monday 02/21/17 and everything went well and this morning when she woke up daughter-in-law noticed a little spotting of blood in her undergarments and has not noticed any since.  Daughter-in-law would like to know if she should be concerned about the one time incident and would like to have a call back if pt should come in.

## 2017-02-24 ENCOUNTER — Encounter: Payer: Self-pay | Admitting: Family Medicine

## 2017-02-24 ENCOUNTER — Ambulatory Visit (INDEPENDENT_AMBULATORY_CARE_PROVIDER_SITE_OTHER): Payer: Medicare Other | Admitting: Family Medicine

## 2017-02-24 VITALS — BP 128/72 | HR 66 | Temp 98.3°F | Ht 64.0 in | Wt 159.9 lb

## 2017-02-24 DIAGNOSIS — K649 Unspecified hemorrhoids: Secondary | ICD-10-CM

## 2017-02-24 NOTE — Telephone Encounter (Signed)
I left a message for Katharine Look to return my call.

## 2017-02-24 NOTE — Telephone Encounter (Signed)
Can pt come in today? Would like to try to figure our where bleeding is from.Marland KitchenMarland Kitchen

## 2017-02-24 NOTE — Progress Notes (Signed)
HPI:  Pamela Choi is a pleasant 83 yo with a past medical history significant for dementia, hypertension and hyperlipidemia here for an acute visit with her daughter for a concern of possible bleeding. The daughter does her laundry and noticed a few small spots of blood in her underwear yesterday morning. The patient denies any bleeding, hemorrhoids, pain or dysuria. The daughter is not sure where the bleeding was from. She has not seen any bleeding in the past nor since that episode.  ROS: See pertinent positives and negatives per HPI.  Past Medical History:  Diagnosis Date  . Acute pericarditis 07/20/2015  . Allergy   . ANKLE SPRAIN, RIGHT 05/10/2007   Qualifier: Diagnosis of  By: Norma Fredrickson MD, Larena Glassman    . Anxiety and depression   . 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 Chicken pox   . Hyperlipidemia   . Hypertension   . Mild dementia    eval with neurology in 2014, mod SVD, declined tx  . Osteopenia   . Pericardial effusion    a. 06/2015 Echo: EF 55%, mild LVH, mild AI, mild dil of Asc Ao (46m, 37m@ root), nl RV fxn, mild TR, small to mod circumferential pericardial effusion.  . Pericarditis    a. 06/2015  . Plantar wart of right foot   . TOBACCO ABUSE, HX OF 11/08/2006   Qualifier: Diagnosis of  By: SiLinden DolinD, AaMarjory Lies    Past Surgical History:  Procedure Laterality Date  . BREAST SURGERY    . BUNIONECTOMY     R foot  . HAMMER TOE SURGERY     Right foot    Family History  Problem Relation Age of Onset  . Diabetes Mother   . Hypertension Mother   . Cancer Brother   . Heart attack Brother   . Stroke Brother   . Stroke Brother     Social History   Social History  . Marital status: Widowed    Spouse name: N/A  . Number of children: 8  . Years of education: HS   Occupational  History  . Retired    Social History Main Topics  . Smoking status: Former Smoker    Quit date: 08/02/2003  . Smokeless tobacco: Never Used  . Alcohol use No  . Drug use: No  . Sexual activity: Not Asked   Other Topics Concern  . None   Social History Narrative   Patient does not drink caffeine.   Patient is left handed.      Current Outpatient Prescriptions:  .  amLODipine (NORVASC) 5 MG tablet, TAKE 1 TABLET BY MOUTH DAILY, Disp: 90 tablet, Rfl: 0 .  aspirin EC 81 MG tablet, Take 81 mg by mouth daily., Disp: , Rfl:  .  atenolol (TENORMIN) 50 MG tablet, TAKE 1 TABLET BY MOUTH DAILY, Disp: 90 tablet, Rfl: 1 .  cetirizine (ZYRTEC) 10 MG tablet, Take 1 tablet (10 mg total) by mouth daily., Disp: 30 tablet, Rfl: 6 .  Cholecalciferol (VITAMIN D3) 3000 units TABS, Take 1 tablet by mouth daily., Disp: , Rfl:  .  donepezil (ARICEPT) 10 MG tablet, TAKE 1 TABLET(10 MG) BY MOUTH AT BEDTIME, Disp: 90 tablet, Rfl: 3 .  escitalopram (LEXAPRO) 5 MG tablet, Take 1 tablet (5 mg total) by mouth daily., Disp: 90 tablet, Rfl:  3 .  pravastatin (PRAVACHOL) 40 MG tablet, TAKE 1 TABLET BY MOUTH EVERY DAY, Disp: 90 tablet, Rfl: 2  EXAM:  Vitals:   02/24/17 1423  BP: 128/72  Pulse: 66  Temp: 98.3 F (36.8 C)    Body mass index is 27.45 kg/m.  GENERAL: vitals reviewed and listed above, alert, oriented, appears well hydrated and in no acute distress  HEENT: atraumatic, conjunttiva clear, no obvious abnormalities on inspection of external nose and ears  NECK: no obvious masses on inspection  ABD: soft, NTTP  GU: normal inspection of external genitalia  RECTAL: Small hemorrhoid mildly irritated, small skin tags  MS: moves all extremities without noticeable abnormality  PSYCH: pleasant and cooperative, no obvious depression or anxiety  ASSESSMENT AND PLAN:  Discussed the following assessment and plan:  Hemorrhoids, unspecified hemorrhoid type  -This was probably from hemorrhoids,  discussed keeping bowels soft over-the-counter options if needed -Advised follow-up if recurrent issues` -Patient advised to return or notify a doctor immediately if symptoms worsen or persist or new concerns arise.  Patient Instructions  Please keep wellness visit and follow up as scheduled.  Please follow up sooner if you have any further bleeding or concerns.  Can use over-the-counter hemorrhoid creams if needed.   Colin Benton R., DO

## 2017-02-24 NOTE — Telephone Encounter (Signed)
Pt has been scheduled today for 2:30 pm.

## 2017-02-24 NOTE — Patient Instructions (Addendum)
Please keep wellness visit and follow up as scheduled.  Please follow up sooner if you have any further bleeding or concerns.  Can use over-the-counter hemorrhoid creams if needed.

## 2017-02-24 NOTE — Progress Notes (Signed)
Pre visit review using our clinic review tool, if applicable. No additional management support is needed unless otherwise documented below in the visit note. 

## 2017-02-28 ENCOUNTER — Encounter (HOSPITAL_COMMUNITY): Payer: Self-pay | Admitting: Emergency Medicine

## 2017-02-28 ENCOUNTER — Other Ambulatory Visit (HOSPITAL_COMMUNITY): Payer: Medicare Other

## 2017-02-28 ENCOUNTER — Emergency Department (HOSPITAL_COMMUNITY): Payer: Medicare Other

## 2017-02-28 ENCOUNTER — Telehealth: Payer: Self-pay | Admitting: Family Medicine

## 2017-02-28 ENCOUNTER — Observation Stay (HOSPITAL_COMMUNITY)
Admission: EM | Admit: 2017-02-28 | Discharge: 2017-03-02 | Disposition: A | Discharge status: Skilled Nursing Facility | Payer: Medicare Other | Attending: Internal Medicine | Admitting: Internal Medicine

## 2017-02-28 ENCOUNTER — Observation Stay (HOSPITAL_COMMUNITY): Payer: Medicare Other

## 2017-02-28 DIAGNOSIS — R001 Bradycardia, unspecified: Secondary | ICD-10-CM | POA: Diagnosis not present

## 2017-02-28 DIAGNOSIS — S0081XA Abrasion of other part of head, initial encounter: Secondary | ICD-10-CM | POA: Insufficient documentation

## 2017-02-28 DIAGNOSIS — Z7982 Long term (current) use of aspirin: Secondary | ICD-10-CM | POA: Diagnosis not present

## 2017-02-28 DIAGNOSIS — Z79899 Other long term (current) drug therapy: Secondary | ICD-10-CM | POA: Insufficient documentation

## 2017-02-28 DIAGNOSIS — R55 Syncope and collapse: Principal | ICD-10-CM | POA: Insufficient documentation

## 2017-02-28 DIAGNOSIS — S0083XA Contusion of other part of head, initial encounter: Secondary | ICD-10-CM | POA: Diagnosis not present

## 2017-02-28 DIAGNOSIS — F3342 Major depressive disorder, recurrent, in full remission: Secondary | ICD-10-CM | POA: Insufficient documentation

## 2017-02-28 DIAGNOSIS — F0390 Unspecified dementia without behavioral disturbance: Secondary | ICD-10-CM | POA: Insufficient documentation

## 2017-02-28 DIAGNOSIS — I10 Essential (primary) hypertension: Secondary | ICD-10-CM | POA: Diagnosis not present

## 2017-02-28 DIAGNOSIS — S0031XA Abrasion of nose, initial encounter: Secondary | ICD-10-CM | POA: Diagnosis not present

## 2017-02-28 DIAGNOSIS — Z91030 Bee allergy status: Secondary | ICD-10-CM | POA: Insufficient documentation

## 2017-02-28 DIAGNOSIS — M79602 Pain in left arm: Secondary | ICD-10-CM

## 2017-02-28 DIAGNOSIS — S022XXA Fracture of nasal bones, initial encounter for closed fracture: Secondary | ICD-10-CM | POA: Insufficient documentation

## 2017-02-28 DIAGNOSIS — Z923 Personal history of irradiation: Secondary | ICD-10-CM | POA: Insufficient documentation

## 2017-02-28 DIAGNOSIS — E876 Hypokalemia: Secondary | ICD-10-CM | POA: Diagnosis not present

## 2017-02-28 DIAGNOSIS — R413 Other amnesia: Secondary | ICD-10-CM

## 2017-02-28 DIAGNOSIS — W19XXXA Unspecified fall, initial encounter: Secondary | ICD-10-CM | POA: Insufficient documentation

## 2017-02-28 DIAGNOSIS — E785 Hyperlipidemia, unspecified: Secondary | ICD-10-CM | POA: Diagnosis not present

## 2017-02-28 DIAGNOSIS — Y92009 Unspecified place in unspecified non-institutional (private) residence as the place of occurrence of the external cause: Secondary | ICD-10-CM | POA: Diagnosis not present

## 2017-02-28 DIAGNOSIS — Z87891 Personal history of nicotine dependence: Secondary | ICD-10-CM | POA: Insufficient documentation

## 2017-02-28 DIAGNOSIS — Z853 Personal history of malignant neoplasm of breast: Secondary | ICD-10-CM | POA: Insufficient documentation

## 2017-02-28 LAB — CBC WITH DIFFERENTIAL/PLATELET
Basophils Absolute: 0 10*3/uL (ref 0.0–0.1)
Basophils Relative: 0 %
EOS ABS: 0.1 10*3/uL (ref 0.0–0.7)
EOS PCT: 1 %
HCT: 40 % (ref 36.0–46.0)
HEMOGLOBIN: 13 g/dL (ref 12.0–15.0)
LYMPHS ABS: 1.7 10*3/uL (ref 0.7–4.0)
LYMPHS PCT: 13 %
MCH: 32.1 pg (ref 26.0–34.0)
MCHC: 32.5 g/dL (ref 30.0–36.0)
MCV: 98.8 fL (ref 78.0–100.0)
MONOS PCT: 7 %
Monocytes Absolute: 0.8 10*3/uL (ref 0.1–1.0)
Neutro Abs: 9.9 10*3/uL — ABNORMAL HIGH (ref 1.7–7.7)
Neutrophils Relative %: 79 %
Platelets: 176 10*3/uL (ref 150–400)
RBC: 4.05 MIL/uL (ref 3.87–5.11)
RDW: 13.1 % (ref 11.5–15.5)
WBC: 12.5 10*3/uL — ABNORMAL HIGH (ref 4.0–10.5)

## 2017-02-28 LAB — BASIC METABOLIC PANEL
Anion gap: 8 (ref 5–15)
BUN: 25 mg/dL — AB (ref 6–20)
CO2: 24 mmol/L (ref 22–32)
Calcium: 8.9 mg/dL (ref 8.9–10.3)
Chloride: 110 mmol/L (ref 101–111)
Creatinine, Ser: 1.03 mg/dL — ABNORMAL HIGH (ref 0.44–1.00)
GFR calc Af Amer: 56 mL/min — ABNORMAL LOW (ref >60)
GFR, EST NON AFRICAN AMERICAN: 49 mL/min — AB (ref >60)
Glucose, Bld: 93 mg/dL (ref 65–99)
POTASSIUM: 3.3 mmol/L — AB (ref 3.5–5.1)
Sodium: 142 mmol/L (ref 135–145)

## 2017-02-28 LAB — TROPONIN I

## 2017-02-28 LAB — URINALYSIS, ROUTINE W REFLEX MICROSCOPIC
Bilirubin Urine: NEGATIVE
GLUCOSE, UA: NEGATIVE mg/dL
Hgb urine dipstick: NEGATIVE
KETONES UR: NEGATIVE mg/dL
LEUKOCYTES UA: NEGATIVE
Nitrite: NEGATIVE
PH: 6 (ref 5.0–8.0)
Protein, ur: NEGATIVE mg/dL
SPECIFIC GRAVITY, URINE: 1.016 (ref 1.005–1.030)

## 2017-02-28 LAB — TSH: TSH: 2.303 u[IU]/mL (ref 0.350–4.500)

## 2017-02-28 LAB — MAGNESIUM: Magnesium: 1.9 mg/dL (ref 1.7–2.4)

## 2017-02-28 LAB — CK: Total CK: 160 U/L (ref 38–234)

## 2017-02-28 MED ORDER — FENTANYL CITRATE (PF) 100 MCG/2ML IJ SOLN
50.0000 ug | Freq: Once | INTRAMUSCULAR | Status: AC
  Administered 2017-02-28: 50 ug via INTRAVENOUS
  Filled 2017-02-28: qty 2

## 2017-02-28 MED ORDER — PRAVASTATIN SODIUM 40 MG PO TABS
40.0000 mg | ORAL_TABLET | Freq: Every day | ORAL | Status: DC
  Administered 2017-02-28 – 2017-03-02 (×3): 40 mg via ORAL
  Filled 2017-02-28 (×3): qty 1

## 2017-02-28 MED ORDER — ACETAMINOPHEN 650 MG RE SUPP: 650.0000 mg | Freq: Four times a day (QID) | RECTAL | Status: DC | PRN

## 2017-02-28 MED ORDER — HYDROCODONE-ACETAMINOPHEN 5-325 MG PO TABS
1.0000 | ORAL_TABLET | Freq: Once | ORAL | Status: AC
  Administered 2017-02-28: 1 via ORAL
  Filled 2017-02-28: qty 1

## 2017-02-28 MED ORDER — SODIUM CHLORIDE 0.9 % IV SOLN
INTRAVENOUS | Status: AC
  Administered 2017-02-28 – 2017-03-01 (×2): via INTRAVENOUS

## 2017-02-28 MED ORDER — POTASSIUM CHLORIDE CRYS ER 20 MEQ PO TBCR
40.0000 meq | EXTENDED_RELEASE_TABLET | Freq: Once | ORAL | Status: AC
  Administered 2017-02-28: 40 meq via ORAL
  Filled 2017-02-28: qty 2

## 2017-02-28 MED ORDER — DONEPEZIL HCL 10 MG PO TABS
10.0000 mg | ORAL_TABLET | Freq: Every day | ORAL | Status: DC
  Administered 2017-02-28: 10 mg via ORAL
  Filled 2017-02-28: qty 1

## 2017-02-28 MED ORDER — ONDANSETRON HCL 4 MG PO TABS: 4.0000 mg | ORAL_TABLET | Freq: Four times a day (QID) | ORAL | Status: DC | PRN

## 2017-02-28 MED ORDER — ONDANSETRON HCL 4 MG/2ML IJ SOLN: 4.0000 mg | Freq: Four times a day (QID) | INTRAMUSCULAR | Status: DC | PRN

## 2017-02-28 MED ORDER — OXYCODONE HCL 5 MG PO TABS
5.0000 mg | ORAL_TABLET | ORAL | Status: DC | PRN
  Administered 2017-02-28 – 2017-03-01 (×4): 5 mg via ORAL
  Filled 2017-02-28 (×5): qty 1

## 2017-02-28 MED ORDER — ESCITALOPRAM OXALATE 10 MG PO TABS
5.0000 mg | ORAL_TABLET | Freq: Every day | ORAL | Status: DC
Start: 2017-02-28 — End: 2017-03-02
  Administered 2017-02-28 – 2017-03-02 (×3): 5 mg via ORAL
  Filled 2017-02-28 (×3): qty 1

## 2017-02-28 MED ORDER — LEVALBUTEROL HCL 0.63 MG/3ML IN NEBU: 0.6300 mg | INHALATION_SOLUTION | Freq: Four times a day (QID) | RESPIRATORY_TRACT | Status: DC | PRN

## 2017-02-28 MED ORDER — LORAZEPAM 1 MG PO TABS
1.0000 mg | ORAL_TABLET | Freq: Once | ORAL | Status: AC
  Administered 2017-02-28: 1 mg via ORAL
  Filled 2017-02-28: qty 1

## 2017-02-28 MED ORDER — SODIUM CHLORIDE 0.9 % IV BOLUS (SEPSIS)
500.0000 mL | Freq: Once | INTRAVENOUS | Status: AC
  Administered 2017-02-28: 500 mL via INTRAVENOUS

## 2017-02-28 MED ORDER — DIFLUPREDNATE 0.05 % OP EMUL
1.0000 [drp] | Freq: Three times a day (TID) | OPHTHALMIC | Status: DC
  Administered 2017-02-28 – 2017-03-01 (×2): 1 [drp] via OPHTHALMIC

## 2017-02-28 MED ORDER — ACETAMINOPHEN 325 MG PO TABS
650.0000 mg | ORAL_TABLET | Freq: Four times a day (QID) | ORAL | Status: DC | PRN
  Filled 2017-02-28: qty 2

## 2017-02-28 MED ORDER — ENOXAPARIN SODIUM 40 MG/0.4ML ~~LOC~~ SOLN
40.0000 mg | SUBCUTANEOUS | Status: DC
  Administered 2017-02-28 – 2017-03-01 (×2): 40 mg via SUBCUTANEOUS
  Filled 2017-02-28 (×3): qty 0.4

## 2017-02-28 NOTE — ED Notes (Signed)
Bed: WA10 Expected date:  Expected time:  Means of arrival:  Comments: EMS 

## 2017-02-28 NOTE — ED Notes (Signed)
Family at bedside. 

## 2017-02-28 NOTE — ED Notes (Signed)
Patient transported to X-ray & CT °

## 2017-02-28 NOTE — ED Notes (Signed)
Ambulated with difficulty.

## 2017-02-28 NOTE — Telephone Encounter (Signed)
FYI--- Daughter calling to let Dr. Maudie Mercury know that Pamela Choi is in the hospital with a broken nose and the hospital thinks that her Bp dropped and passed out and fell.

## 2017-02-28 NOTE — ED Triage Notes (Signed)
Pt with fall sometime during 7p-6a, pt arrived with injury to face and head, abrasions to face, swelling to forehead, pt c/o bilateral pain, especially L arm. Pt c/o bilateral lower extremities pain. Pt unsure of event, disoriented to place and time.

## 2017-02-28 NOTE — Telephone Encounter (Signed)
Please let her know I am so sorry to hear she is in the hospital and hope she feels better soon. Let her know she will be in my prayers.

## 2017-02-28 NOTE — Telephone Encounter (Signed)
I called Pamela Choi and informed her of the message below.

## 2017-02-28 NOTE — ED Notes (Signed)
Patient transported to MRI 

## 2017-02-28 NOTE — H&P (Addendum)
[facility] History and Physical  Pamela Choi VHQ:[id number] DOB: 03-30-1930 DOA: 02/28/2017  Referring physician:   PCP: Lucretia Kern., DO   Chief Complaint:   Fall   . Head Injury     HPI:   83 year old female with a past medical history of dementia, hypertension, dyslipidemia,R infiltrating ductal carcinoma of breast s/p radiation therapy > 10 years ago, acute pericarditis, anxiety, depression, who lives alone, next to her daughter and her son-in-law, who presents to ED today after a fall. Patient was found by her daughter on the floor at around 6 AM this morning.She has been well enough to perform most of the ADLs by herself and lives independently. When the patient arrived to the ER, she was found to have abrasions on her nose, head, bruising of her left periorbital area. She complained of excruciating left upper extremity pain. Patient is unable to recall the events but patient's daughter states that she probably fell this morning. When the daughter found her she was sitting on the floor, and in a lot of pain. A week ago patient was seen by PCP for rectal bleeding which was attribute it to hemorrhoids. She has not had any recurrent bleeding since. Daughter denies any dysarthria, if easier, focal weakness this morning ED course BP 138/70   Pulse (!) 59   Temp 97.8 F (36.6 C) (Oral)   Resp 16   SpO2 96% . At one point patient systolic blood pressure dropped into the 60s. Patient had a syncopal episode when she attempted to stand and blood pressure dropped into the 60s. Imaging studies including left humerus, left forearm, left hand, right hand shows no acute abnormality. Due to pain in her left upper extremity has been placed in a sling. 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 EKG shows T-wave inversions which are not new  Patient has no prior history of  any brain aneurysm. Patient is being admitted for further evaluation of a syncopal episode which was unwitnessed, evaluation of her orthostatic hypotension and brain aneurysm.       Review of Systems: negative for the following      Unable to perform ROS: Dementia \  Past Medical History:  Diagnosis Date  . Acute pericarditis 07/20/2015  . Allergy   . ANKLE SPRAIN, RIGHT 05/10/2007   Qualifier: Diagnosis of  By: Norma Fredrickson MD, Larena Glassman    . Anxiety and depression   . 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 Chicken pox   . Hyperlipidemia   . Hypertension   . Mild dementia    eval with neurology in 2014, mod SVD, declined tx  . Osteopenia   . Pericardial effusion    a. 06/2015 Echo: EF 55%, mild LVH, mild AI, mild dil of Asc Ao (59m, 370m@ root), nl RV fxn, mild TR, small to mod circumferential pericardial effusion.  . Pericarditis    a. 06/2015  . Plantar wart of right foot   . TOBACCO ABUSE, HX OF 11/08/2006   Qualifier: Diagnosis of  By: SiLinden DolinD, AaMarjory Lies     Past Surgical History:  Procedure Laterality Date  . BREAST SURGERY    . BUNIONECTOMY     R foot  .  HAMMER TOE SURGERY     Right foot      Social History:  reports that she quit smoking about 13 years ago. She has never used smokeless tobacco. She reports that she does not drink alcohol or use drugs.    Allergies  Allergen Reactions  . Bee Venom Anaphylaxis    Family History  Problem Relation Age of Onset  . Diabetes Mother   . Hypertension Mother   . Cancer Brother   . Heart attack Brother   . Stroke Brother   . Stroke Brother          Prior to Admission medications   Medication Sig Start Date End Date Taking? Authorizing Provider  amLODipine (NORVASC) 5 MG tablet TAKE 1 TABLET BY MOUTH DAILY 11/23/16  Yes Lucretia Kern, DO  aspirin EC 81 MG tablet Take 81 mg by mouth daily.   Yes Historical Provider, MD  atenolol (TENORMIN) 50 MG tablet TAKE 1 TABLET BY MOUTH DAILY 05/27/16  Yes Lucretia Kern, DO  BESIVANCE 0.6 % SUSP Place 1 drop into the left eye 3 (three) times daily. 02/22/17  Yes Historical Provider, MD  cetirizine (ZYRTEC) 10 MG tablet Take 1 tablet (10 mg total) by mouth daily. 08/14/12  Yes Pedro Earls, MD  Cholecalciferol (VITAMIN D3) 3000 units TABS Take 1 tablet by mouth daily.   Yes Historical Provider, MD  donepezil (ARICEPT) 10 MG tablet TAKE 1 TABLET(10 MG) BY MOUTH AT BEDTIME 10/27/16  Yes Kathrynn Ducking, MD  DUREZOL 0.05 % EMUL Place 1 drop into the left eye 3 (three) times daily. 02/22/17  Yes Historical Provider, MD  escitalopram (LEXAPRO) 5 MG tablet Take 1 tablet (5 mg total) by mouth daily. 12/30/16  Yes Lucretia Kern, DO  pravastatin (PRAVACHOL) 40 MG tablet TAKE 1 TABLET BY MOUTH EVERY DAY 01/31/17  Yes Lucretia Kern, DO  PROLENSA 0.07 % SOLN Place 1 drop into the left eye at bedtime. 02/26/17  Yes Historical Provider, MD     Physical Exam: Vitals:   02/28/17 1123 02/28/17 1207 02/28/17 1248 02/28/17 1300  BP: 126/64 (!) 58/39 141/76 132/71  Pulse: 60  60 64  Resp: '20  20 16  '$ Temp: 98.6 F (37 C)     TempSrc: Oral     SpO2: 96%  97% 96%      Constitutional: Complaining of a lot of pain everywhere Vitals:   02/28/17 1123 02/28/17 1207 02/28/17 1248 02/28/17 1300  BP: 126/64 (!) 58/39 141/76 132/71  Pulse: 60  60 64  Resp: '20  20 16  '$ Temp: 98.6 F (37 C)     TempSrc: Oral     SpO2: 96%  97% 96%   Mouth/Throat: Uvula is midline, oropharynx is clear and moist and mucous membranes are normal.  Left for head abrasion. Abrasion over the bridge of the nose with swelling.  Eyes: Conjunctivae, EOM and lids are normal. Pupils are equal, round, and reactive to light. Right eye exhibits no discharge. Left eye exhibits no discharge. Right eye exhibits no nystagmus. Left eye exhibits no  nystagmus.  No visible hyphema noted  Neck: Neck supple.  Patient has a towel wrap.  Respiratory: clear to auscultation bilaterally, no wheezing, no crackles. Normal respiratory effort. No accessory muscle use.  Cardiovascular: Regular rate and rhythm, no murmurs / rubs / gallops. No extremity edema. 2+ pedal pulses. No carotid bruits.  Abdomen: no tenderness, no masses palpated. No hepatosplenomegaly. Bowel sounds positive.  Musculoskeletal: no  clubbing / cyanosis. No joint deformity upper and lower extremities. Good ROM, no contractures. Normal muscle tone.  Skin: no rashes, lesions, ulcers. No induration Neurologic: CN 2-12 grossly intact. Sensation intact, DTR normal. Strength 5/5 in all 4.  Psychiatric: Normal judgment and insight. Alert and oriented x 3. Normal mood.     Labs on Admission: I have personally reviewed following labs and imaging studies  CBC:  Recent Labs Lab 02/28/17 0918  WBC 12.5*  NEUTROABS 9.9*  HGB 13.0  HCT 40.0  MCV 98.8  PLT 892    Basic Metabolic Panel:  Recent Labs Lab 02/28/17 0918  NA 142  K 3.3*  CL 110  CO2 24  GLUCOSE 93  BUN 25*  CREATININE 1.03*  CALCIUM 8.9    GFR: Estimated Creatinine Clearance: 39.7 mL/min (by C-G formula based on SCr of 1.03 mg/dL (H)).  Liver Function Tests: No results for input(s): AST, ALT, ALKPHOS, BILITOT, PROT, ALBUMIN in the last 168 hours. No results for input(s): LIPASE, AMYLASE in the last 168 hours. No results for input(s): AMMONIA in the last 168 hours.  Coagulation Profile: No results for input(s): INR, PROTIME in the last 168 hours. No results for input(s): DDIMER in the last 72 hours.  Cardiac Enzymes:  Recent Labs Lab 02/28/17 0918  CKTOTAL 160    BNP (last 3 results) No results for input(s): PROBNP in the last 8760 hours.  HbA1C: No results for input(s): HGBA1C in the last 72 hours. No results found for: HGBA1C   CBG: No results for input(s): GLUCAP in the last 168  hours.  Lipid Profile: No results for input(s): CHOL, HDL, LDLCALC, TRIG, CHOLHDL, LDLDIRECT in the last 72 hours.  Thyroid Function Tests: No results for input(s): TSH, T4TOTAL, FREET4, T3FREE, THYROIDAB in the last 72 hours.  Anemia Panel: No results for input(s): VITAMINB12, FOLATE, FERRITIN, TIBC, IRON, RETICCTPCT in the last 72 hours.  Urine analysis:    Component Value Date/Time   COLORURINE YELLOW 02/28/2017 1022   APPEARANCEUR CLEAR 02/28/2017 1022   LABSPEC 1.016 02/28/2017 1022   PHURINE 6.0 02/28/2017 1022   GLUCOSEU NEGATIVE 02/28/2017 1022   HGBUR NEGATIVE 02/28/2017 1022   BILIRUBINUR NEGATIVE 02/28/2017 1022   [place] 02/28/2017 1022   PROTEINUR NEGATIVE 02/28/2017 1022   NITRITE NEGATIVE 02/28/2017 1022   LEUKOCYTESUR NEGATIVE 02/28/2017 1022    Sepsis Labs: '@LABRCNTIP'$ (procalcitonin:4,lacticidven:4) )No results found for this or any previous visit (from the past 240 hour(s)).       Radiological Exams on Admission: Dg Chest 2 View  Result Date: 02/28/2017 CLINICAL DATA:  Status post he fall. Mental status change. History of hypertension and hyperlipidemia and former smoker. Right breast malignancy. EXAM: CHEST  2 VIEW COMPARISON:  Chest x-ray of July 18, 2015 FINDINGS: The lungs are adequately inflated. There is no focal infiltrate. There is no evidence of a pulmonary contusion, pleural effusion, or pneumothorax. The cardiac silhouette is mildly enlarged. The pulmonary vascularity is not engorged. There is calcification in the wall of the aortic arch. The trachea is midline. The bony thorax exhibits no acute abnormality. IMPRESSION: There is no evidence of acute cardiopulmonary abnormality or acute thoracic injury. Thoracic aortic atherosclerosis. Mild cardiomegaly is likely projectional related to the AP technique Electronically Signed   By: David  Martinique M.D.   On: 02/28/2017 12:38   Dg Forearm Left  Result Date: 02/28/2017 CLINICAL DATA:  Arm pain  secondary to a fall last night EXAM: LEFT FOREARM - 2 VIEW COMPARISON:  None.  FINDINGS: There is no evidence of fracture or other focal bone lesions. Slight arthritis of the radiocarpal joint. Soft tissues are unremarkable. IMPRESSION: No acute abnormalities. Electronically Signed   By: Lorriane Shire M.D.   On: 02/28/2017 08:57   Ct Head Wo Contrast  Result Date: 02/28/2017 CLINICAL DATA:  Pain after trauma. EXAM: CT HEAD WITHOUT CONTRAST CT MAXILLOFACIAL WITHOUT CONTRAST CT CERVICAL SPINE WITHOUT CONTRAST TECHNIQUE: Multidetector CT imaging of the head, cervical spine, and maxillofacial structures were performed using the standard protocol without intravenous contrast. Multiplanar CT image reconstructions of the cervical spine and maxillofacial structures were also generated. COMPARISON:  None. FINDINGS: CT HEAD FINDINGS Brain: No subdural, epidural, or subarachnoid hemorrhage. Cerebellum, brainstem, and basal cisterns are within normal limits. No mass, mass effect, or midline shift. Moderate to severe white matter changes are identified. No acute cortical ischemia is identified. Vascular: Calcified atherosclerosis is seen in the intracranial portions of the carotid arteries. 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 Skull: There is a right nasal bone fracture with mild depression on series 8, image 2, incompletely characterized. No other calvarial fractures identified. Other: There is soft tissue swelling over the forehead. No other soft tissue abnormalities identified. CT MAXILLOFACIAL FINDINGS Osseous: A depressed right nasal bone fracture is identified. No other fractures are identified. Orbits: Negative. No traumatic or inflammatory finding. Sinuses: Clear. Soft tissues: Soft tissue swelling is seen over the right forehead and nose. No other soft tissue abnormalities are identified. CT CERVICAL SPINE FINDINGS Alignment: Normal. Skull base and  vertebrae: No acute fracture. No primary bone lesion or focal pathologic process. Soft tissues and spinal canal: No prevertebral fluid or swelling. No visible canal hematoma. Disc levels: Multilevel degenerative changes with small anterior and posterior osteophytes. Upper chest: Negative. Other: No other abnormalities are identified. IMPRESSION: 1. 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 2. No infarct, ischemia, or bleed identified within the brain. 3. Depressed right nasal bone fracture. 4. No fracture or traumatic malalignment in the cervical spine. Electronically Signed   By: Dorise Bullion III M.D   On: 02/28/2017 09:41   Ct Cervical Spine Wo Contrast  Result Date: 02/28/2017 CLINICAL DATA:  Pain after trauma. EXAM: CT HEAD WITHOUT CONTRAST CT MAXILLOFACIAL WITHOUT CONTRAST CT CERVICAL SPINE WITHOUT CONTRAST TECHNIQUE: Multidetector CT imaging of the head, cervical spine, and maxillofacial structures were performed using the standard protocol without intravenous contrast. Multiplanar CT image reconstructions of the cervical spine and maxillofacial structures were also generated. COMPARISON:  None. FINDINGS: CT HEAD FINDINGS Brain: No subdural, epidural, or subarachnoid hemorrhage. Cerebellum, brainstem, and basal cisterns are within normal limits. No mass, mass effect, or midline shift. Moderate to severe white matter changes are identified. No acute cortical ischemia is identified. Vascular: Calcified atherosclerosis is seen in the intracranial portions of the carotid arteries. 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 Skull: There is a right nasal bone fracture with mild depression on series 8, image 2, incompletely characterized. No other calvarial fractures identified. Other: There is soft tissue swelling over the forehead. No other soft tissue abnormalities  identified. CT MAXILLOFACIAL FINDINGS Osseous: A depressed right nasal bone fracture is identified. No other fractures are identified. Orbits: Negative. No traumatic or inflammatory finding. Sinuses: Clear. Soft tissues: Soft tissue swelling is seen over the right forehead  and nose. No other soft tissue abnormalities are identified. CT CERVICAL SPINE FINDINGS Alignment: Normal. Skull base and vertebrae: No acute fracture. No primary bone lesion or focal pathologic process. Soft tissues and spinal canal: No prevertebral fluid or swelling. No visible canal hematoma. Disc levels: Multilevel degenerative changes with small anterior and posterior osteophytes. Upper chest: Negative. Other: No other abnormalities are identified. IMPRESSION: 1. 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 2. No infarct, ischemia, or bleed identified within the brain. 3. Depressed right nasal bone fracture. 4. No fracture or traumatic malalignment in the cervical spine. Electronically Signed   By: Dorise Bullion III M.D   On: 02/28/2017 09:41   Dg Humerus Left  Result Date: 02/28/2017 CLINICAL DATA:  Unwitnessed fall last night.  Left arm pain. EXAM: LEFT HUMERUS - 2+ VIEW COMPARISON:  None. FINDINGS: There is no evidence of fracture or other focal bone lesions. Slight arthritis of the glenohumeral joint. Soft tissues are unremarkable. IMPRESSION: No acute abnormality. Electronically Signed   By: Lorriane Shire M.D.   On: 02/28/2017 08:56   Dg Hand Complete Left  Result Date: 02/28/2017 CLINICAL DATA:  Left hand pain secondary to a fall last night. EXAM: LEFT HAND - COMPLETE 3+ VIEW COMPARISON:  None. FINDINGS: There is no fracture or dislocation. Slight arthritic changes in the carpal bones and of the radiocarpal joint. Congenital fusion of the lunate and triquetrum. IMPRESSION: No acute abnormalities. Electronically Signed   By: Lorriane Shire M.D.   On:  02/28/2017 08:58   Dg Hand Complete Right  Result Date: 02/28/2017 CLINICAL DATA:  Right hand pain secondary to a fall last night. EXAM: RIGHT HAND - COMPLETE 3+ VIEW COMPARISON:  None. FINDINGS: There is no fracture or dislocation or other acute abnormality. 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 Slight arthritis in the wrist joints as well as at the first and second MCP joints and in the IP joints of the fingers. IMPRESSION: No acute abnormality.  Arthritic changes as described. Electronically Signed   By: Lorriane Shire M.D.   On: 02/28/2017 08:59   Ct Maxillofacial Wo Contrast  Result Date: 02/28/2017 CLINICAL DATA:  Pain after trauma. EXAM: CT HEAD WITHOUT CONTRAST CT MAXILLOFACIAL WITHOUT CONTRAST CT CERVICAL SPINE WITHOUT CONTRAST TECHNIQUE: Multidetector CT imaging of the head, cervical spine, and maxillofacial structures were performed using the standard protocol without intravenous contrast. Multiplanar CT image reconstructions of the cervical spine and maxillofacial structures were also generated. COMPARISON:  None. FINDINGS: CT HEAD FINDINGS Brain: No subdural, epidural, or subarachnoid hemorrhage. Cerebellum, brainstem, and basal cisterns are within normal limits. No mass, mass effect, or midline shift. Moderate to severe white matter changes are identified. No acute cortical ischemia is identified. Vascular: Calcified atherosclerosis is seen in the intracranial portions of the carotid arteries. 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 Skull: There is a right nasal bone fracture with mild depression on series 8, image 2, incompletely characterized. No other calvarial fractures identified. Other: There is soft tissue swelling over the forehead. No other soft tissue abnormalities identified. CT MAXILLOFACIAL FINDINGS Osseous: A depressed right nasal bone fracture is identified. No other fractures are identified. Orbits:  Negative. No traumatic or inflammatory finding. Sinuses: Clear. Soft tissues: Soft tissue swelling is seen over the right forehead and nose. No other soft tissue abnormalities are identified. CT CERVICAL SPINE FINDINGS  Alignment: Normal. Skull base and vertebrae: No acute fracture. No primary bone lesion or focal pathologic process. Soft tissues and spinal canal: No prevertebral fluid or swelling. No visible canal hematoma. Disc levels: Multilevel degenerative changes with small anterior and posterior osteophytes. Upper chest: Negative. Other: No other abnormalities are identified. IMPRESSION: 1. 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 2. No infarct, ischemia, or bleed identified within the brain. 3. Depressed right nasal bone fracture. 4. No fracture or traumatic malalignment in the cervical spine. Electronically Signed   By: Dorise Bullion III M.D   On: 02/28/2017 09:41   Dg Chest 2 View  Result Date: 02/28/2017 CLINICAL DATA:  Status post he fall. Mental status change. History of hypertension and hyperlipidemia and former smoker. Right breast malignancy. EXAM: CHEST  2 VIEW COMPARISON:  Chest x-ray of July 18, 2015 FINDINGS: The lungs are adequately inflated. There is no focal infiltrate. There is no evidence of a pulmonary contusion, pleural effusion, or pneumothorax. The cardiac silhouette is mildly enlarged. The pulmonary vascularity is not engorged. There is calcification in the wall of the aortic arch. The trachea is midline. The bony thorax exhibits no acute abnormality. IMPRESSION: There is no evidence of acute cardiopulmonary abnormality or acute thoracic injury. Thoracic aortic atherosclerosis. Mild cardiomegaly is likely projectional related to the AP technique Electronically Signed   By: David  Martinique M.D.   On: 02/28/2017 12:38   Dg Forearm Left  Result Date: 02/28/2017 CLINICAL DATA:  Arm pain secondary to a fall  last night EXAM: LEFT FOREARM - 2 VIEW COMPARISON:  None. FINDINGS: There is no evidence of fracture or other focal bone lesions. Slight arthritis of the radiocarpal joint. Soft tissues are unremarkable. IMPRESSION: No acute abnormalities. Electronically Signed   By: Lorriane Shire M.D.   On: 02/28/2017 08:57   Ct Head Wo Contrast  Result Date: 02/28/2017 CLINICAL DATA:  Pain after trauma. EXAM: CT HEAD WITHOUT CONTRAST CT MAXILLOFACIAL WITHOUT CONTRAST CT CERVICAL SPINE WITHOUT CONTRAST TECHNIQUE: Multidetector CT imaging of the head, cervical spine, and maxillofacial structures were performed using the standard protocol without intravenous contrast. Multiplanar CT image reconstructions of the cervical spine and maxillofacial structures were also generated. COMPARISON:  None. FINDINGS: CT HEAD FINDINGS Brain: No subdural, epidural, or subarachnoid hemorrhage. Cerebellum, brainstem, and basal cisterns are within normal limits. No mass, mass effect, or midline shift. Moderate to severe white matter changes are identified. No acute cortical ischemia is identified. Vascular: Calcified atherosclerosis is seen in the intracranial portions of the carotid arteries. 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 Skull: There is a right nasal bone fracture with mild depression on series 8, image 2, incompletely characterized. No other calvarial fractures identified. Other: There is soft tissue swelling over the forehead. No other soft tissue abnormalities identified. CT MAXILLOFACIAL FINDINGS Osseous: A depressed right nasal bone fracture is identified. No other fractures are identified. Orbits: Negative. No traumatic or inflammatory finding. Sinuses: Clear. Soft tissues: Soft tissue swelling is seen over the right forehead and nose. No other soft tissue abnormalities are identified. CT CERVICAL SPINE FINDINGS Alignment: Normal. Skull base and vertebrae: No acute  fracture. No primary bone lesion or focal pathologic process. Soft tissues and spinal canal: No prevertebral fluid or swelling. No visible canal hematoma. Disc levels: Multilevel degenerative changes with small anterior and posterior osteophytes. Upper chest: Negative. Other:  No other abnormalities are identified. IMPRESSION: 1. 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 2. No infarct, ischemia, or bleed identified within the brain. 3. Depressed right nasal bone fracture. 4. No fracture or traumatic malalignment in the cervical spine. Electronically Signed   By: Dorise Bullion III M.D   On: 02/28/2017 09:41   Ct Cervical Spine Wo Contrast  Result Date: 02/28/2017 CLINICAL DATA:  Pain after trauma. EXAM: CT HEAD WITHOUT CONTRAST CT MAXILLOFACIAL WITHOUT CONTRAST CT CERVICAL SPINE WITHOUT CONTRAST TECHNIQUE: Multidetector CT imaging of the head, cervical spine, and maxillofacial structures were performed using the standard protocol without intravenous contrast. Multiplanar CT image reconstructions of the cervical spine and maxillofacial structures were also generated. COMPARISON:  None. FINDINGS: CT HEAD FINDINGS Brain: No subdural, epidural, or subarachnoid hemorrhage. Cerebellum, brainstem, and basal cisterns are within normal limits. No mass, mass effect, or midline shift. Moderate to severe white matter changes are identified. No acute cortical ischemia is identified. Vascular: Calcified atherosclerosis is seen in the intracranial portions of the carotid arteries. 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 Skull: There is a right nasal bone fracture with mild depression on series 8, image 2, incompletely characterized. No other calvarial fractures identified. Other: There is soft tissue swelling over the forehead. No other soft tissue abnormalities identified. CT  MAXILLOFACIAL FINDINGS Osseous: A depressed right nasal bone fracture is identified. No other fractures are identified. Orbits: Negative. No traumatic or inflammatory finding. Sinuses: Clear. Soft tissues: Soft tissue swelling is seen over the right forehead and nose. No other soft tissue abnormalities are identified. CT CERVICAL SPINE FINDINGS Alignment: Normal. Skull base and vertebrae: No acute fracture. No primary bone lesion or focal pathologic process. Soft tissues and spinal canal: No prevertebral fluid or swelling. No visible canal hematoma. Disc levels: Multilevel degenerative changes with small anterior and posterior osteophytes. Upper chest: Negative. Other: No other abnormalities are identified. IMPRESSION: 1. 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 2. No infarct, ischemia, or bleed identified within the brain. 3. Depressed right nasal bone fracture. 4. No fracture or traumatic malalignment in the cervical spine. Electronically Signed   By: Dorise Bullion III M.D   On: 02/28/2017 09:41   Dg Humerus Left  Result Date: 02/28/2017 CLINICAL DATA:  Unwitnessed fall last night.  Left arm pain. EXAM: LEFT HUMERUS - 2+ VIEW COMPARISON:  None. FINDINGS: There is no evidence of fracture or other focal bone lesions. Slight arthritis of the glenohumeral joint. Soft tissues are unremarkable. IMPRESSION: No acute abnormality. Electronically Signed   By: Lorriane Shire M.D.   On: 02/28/2017 08:56   Dg Hand Complete Left  Result Date: 02/28/2017 CLINICAL DATA:  Left hand pain secondary to a fall last night. EXAM: LEFT HAND - COMPLETE 3+ VIEW COMPARISON:  None. FINDINGS: There is no fracture or dislocation. Slight arthritic changes in the carpal bones and of the radiocarpal joint. Congenital fusion of the lunate and triquetrum. IMPRESSION: No acute abnormalities. Electronically Signed   By: Lorriane Shire M.D.   On: 02/28/2017 08:58    Dg Hand Complete Right  Result Date: 02/28/2017 CLINICAL DATA:  Right hand pain secondary to a fall last night. EXAM: RIGHT HAND - COMPLETE 3+ VIEW COMPARISON:  None. FINDINGS: There is no fracture or dislocation  or other acute abnormality. 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 Slight arthritis in the wrist joints as well as at the first and second MCP joints and in the IP joints of the fingers. IMPRESSION: No acute abnormality.  Arthritic changes as described. Electronically Signed   By: Lorriane Shire M.D.   On: 02/28/2017 08:59   Ct Maxillofacial Wo Contrast  Result Date: 02/28/2017 CLINICAL DATA:  Pain after trauma. EXAM: CT HEAD WITHOUT CONTRAST CT MAXILLOFACIAL WITHOUT CONTRAST CT CERVICAL SPINE WITHOUT CONTRAST TECHNIQUE: Multidetector CT imaging of the head, cervical spine, and maxillofacial structures were performed using the standard protocol without intravenous contrast. Multiplanar CT image reconstructions of the cervical spine and maxillofacial structures were also generated. COMPARISON:  None. FINDINGS: CT HEAD FINDINGS Brain: No subdural, epidural, or subarachnoid hemorrhage. Cerebellum, brainstem, and basal cisterns are within normal limits. No mass, mass effect, or midline shift. Moderate to severe white matter changes are identified. No acute cortical ischemia is identified. Vascular: Calcified atherosclerosis is seen in the intracranial portions of the carotid arteries. 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 Skull: There is a right nasal bone fracture with mild depression on series 8, image 2, incompletely characterized. No other calvarial fractures identified. Other: There is soft tissue swelling over the forehead. No other soft tissue abnormalities identified. CT MAXILLOFACIAL FINDINGS Osseous: A depressed right nasal bone fracture is identified. No other fractures are identified. Orbits: Negative. No traumatic  or inflammatory finding. Sinuses: Clear. Soft tissues: Soft tissue swelling is seen over the right forehead and nose. No other soft tissue abnormalities are identified. CT CERVICAL SPINE FINDINGS Alignment: Normal. Skull base and vertebrae: No acute fracture. No primary bone lesion or focal pathologic process. Soft tissues and spinal canal: No prevertebral fluid or swelling. No visible canal hematoma. Disc levels: Multilevel degenerative changes with small anterior and posterior osteophytes. Upper chest: Negative. Other: No other abnormalities are identified. IMPRESSION: 1. 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 2. No infarct, ischemia, or bleed identified within the brain. 3. Depressed right nasal bone fracture. 4. No fracture or traumatic malalignment in the cervical spine. Electronically Signed   By: Dorise Bullion III M.D   On: 02/28/2017 09:41      EKG: Independently reviewed.  T-wave inversion in the lateral leads is no new Assessment/Plan Principal Problem:   Syncope and collapse/fall Noted to be orthostatic in the ER Resuscitated with IV fluids, repeat blood pressure improved Will continue gentle IV hydration Continue to check orthostatics No evidence of acute cardiopulmonary abnormality No underlying signs of infection despite leukocytosis Does not appear to have had an acute neurologic event PT OT evaluation  Fall Continuous slowing in the left upper extremity, no obvious fracture Could consider CT of the left upper extremity the patient continues to have pain   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 Will obtain MRI/MRA of the brain to rule out CVA, further evaluate the patient's aneurysm Could consult interventional radiology to evaluate for possible intervention  Orthostatic hypotension Hold Norvasc, atenolol given that the heart rate is in the 50s and 60s Patient could also be  bradycardic secondary to Aricept Hold all antihypertensive medications and monitor  Hypokalemia-replete      Hyperlipemia-continue statin    Essential hypertension-management as above  Recent cataract surgery-patient has been advised to continue using her outpatient eyedrops  Memory loss/history of dementia Fairly independent up until now, may need placement on discharge      DVT prophylaxis: Lovenox     Code Status Orders Full code         consults called:None  Family Communication: Admission, patients condition and plan of care including tests being ordered have been discussed with the patient  /Daughter who indicates understanding and agree with the plan and Code Status  Admission status:  Observation  Disposition plan: Further plan will depend as patient's clinical course evolves and further radiologic and laboratory data become available. Likely home when stable     [facility] MD [facility] Pager [phone]  If 7PM-7AM, please contact night-coverage [email or web address] Password [email or web address]  02/28/2017, 1:45 PM

## 2017-02-28 NOTE — ED Provider Notes (Signed)
[facility]-EMERGENCY DEPT Provider Note   CSN: [id number] Arrival date & time: 02/28/17  0809     History   Chief Complaint Chief Complaint  Patient presents with  . Fall  . Head Injury    HPI Pamela Choi is a 83 y.o. female.  Patient with history of dementia, lives at home by herself -- presents after a fall occurring sometime after 7 PM last night. This was the last time her family spoke with her. Patient presents with abrasions to her face as well as complaints of arm pain. Level V caveat due to dementia. Patient cannot give details of the event. She cannot member how she got to the hospital (EMS was called).      Past Medical History:  Diagnosis Date  . Acute pericarditis 07/20/2015  . Allergy   . ANKLE SPRAIN, RIGHT 05/10/2007   Qualifier: Diagnosis of  By: Sherlon Handing MD, Larene Pickett    . Anxiety and depression   . 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 Chicken pox   . Hyperlipidemia   . Hypertension   . Mild dementia    eval with neurology in 2014, mod SVD, declined tx  . Osteopenia   . Pericardial effusion    a. 06/2015 Echo: EF 55%, mild LVH, mild AI, mild dil of Asc Ao (75mm, 49mm @ root), nl RV fxn, mild TR, small to mod circumferential pericardial effusion.  . Pericarditis    a. 06/2015  . Plantar wart of right foot   . TOBACCO ABUSE, HX OF 11/08/2006   Qualifier: Diagnosis of  By: Michael Boston MD, Clifton Custard      Patient Active Problem List   Diagnosis Date Noted  . Recurrent major depressive disorder, in full remission (HCC) 12/30/2016  . Memory loss 02/22/2013  . Hyperlipemia 01/11/2007  . Essential hypertension 11/08/2006    Past Surgical History:  Procedure Laterality Date  . BREAST SURGERY    . BUNIONECTOMY     R foot  . HAMMER TOE SURGERY     Right foot    OB History    No  data available       Home Medications    Prior to Admission medications   Medication Sig Start Date End Date Taking? Authorizing Provider  amLODipine (NORVASC) 5 MG tablet TAKE 1 TABLET BY MOUTH DAILY 11/23/16   Terressa Koyanagi, DO  aspirin EC 81 MG tablet Take 81 mg by mouth daily.    Historical Provider, MD  atenolol (TENORMIN) 50 MG tablet TAKE 1 TABLET BY MOUTH DAILY 05/27/16   Terressa Koyanagi, DO  cetirizine (ZYRTEC) 10 MG tablet Take 1 tablet (10 mg total) by mouth daily. 08/14/12   Elyse Jarvis, MD  Cholecalciferol (VITAMIN D3) 3000 units TABS Take 1 tablet by mouth daily.    Historical Provider, MD  donepezil (ARICEPT) 10 MG tablet TAKE 1 TABLET(10 MG) BY MOUTH AT BEDTIME 10/27/16   York Spaniel, MD  escitalopram (LEXAPRO) 5 MG tablet Take 1 tablet (5 mg total) by mouth daily. 12/30/16   Terressa Koyanagi, DO  pravastatin (PRAVACHOL) 40 MG tablet TAKE 1 TABLET BY MOUTH EVERY DAY 01/31/17   Terressa Koyanagi, DO    Family History Family History  Problem Relation Age of Onset  . Diabetes Mother   . Hypertension Mother   .  Cancer Brother   . Heart attack Brother   . Stroke Brother   . Stroke Brother     Social History Social History  Substance Use Topics  . Smoking status: Former Smoker    Quit date: 08/02/2003  . Smokeless tobacco: Never Used  . Alcohol use No     Allergies   Bee venom   Review of Systems Review of Systems  Unable to perform ROS: Dementia     Physical Exam Updated Vital Signs BP 138/70   Pulse (!) 59   Temp 97.8 F (36.6 C) (Oral)   Resp 16   SpO2 96%   Physical Exam  Constitutional: She appears well-developed and well-nourished.  HENT:  Head: Normocephalic. Head is without raccoon's eyes and without Battle's sign.  Right Ear: Tympanic membrane, external ear and ear canal normal. No hemotympanum.  Left Ear: Tympanic membrane, external ear and ear canal normal. No hemotympanum.  Nose: Nose normal. No nasal septal hematoma.  Mouth/Throat: Uvula is  midline, oropharynx is clear and moist and mucous membranes are normal.  Left for head abrasion. Abrasion over the bridge of the nose with swelling.  Eyes: Conjunctivae, EOM and lids are normal. Pupils are equal, round, and reactive to light. Right eye exhibits no discharge. Left eye exhibits no discharge. Right eye exhibits no nystagmus. Left eye exhibits no nystagmus.  No visible hyphema noted  Neck: Neck supple.  Patient has a towel wrap.  Cardiovascular: Normal rate, regular rhythm and normal heart sounds.   Pulmonary/Chest: Effort normal and breath sounds normal. No respiratory distress. She has no wheezes. She has no rales.  Abdominal: Soft. There is no tenderness.  Musculoskeletal: She exhibits no edema.       Cervical back: She exhibits normal range of motion, no tenderness and no bony tenderness.       Thoracic back: She exhibits no tenderness and no bony tenderness.       Lumbar back: She exhibits no tenderness and no bony tenderness.  Patient with diffuse upper extremity tenderness especially of the left humerus and forearm. She complains of bilateral hand pain. She does not complain of hip pain or pain in her legs. No chest pain or abdominal pain.  Neurological: She is alert. She has normal strength and normal reflexes. No cranial nerve deficit or sensory deficit. Coordination normal. GCS eye subscore is 4. GCS verbal subscore is 5. GCS motor subscore is 6.  Patient knows her name. She knows that she is in the hospital and in [place]. He states that it is April 2012.  Skin: Skin is warm and dry.  Psychiatric: She has a normal mood and affect.  Nursing note and vitals reviewed.    ED Treatments / Results  Labs (all labs ordered are listed, but only abnormal results are displayed) Labs Reviewed  CBC WITH DIFFERENTIAL/PLATELET - Abnormal; Notable for the following:       Result Value   WBC 12.5 (*)    Neutro Abs 9.9 (*)    All other components within normal limits  BASIC  METABOLIC PANEL - Abnormal; Notable for the following:    Potassium 3.3 (*)    BUN 25 (*)    Creatinine, Ser 1.03 (*)    GFR calc non Af Amer 49 (*)    GFR calc Af Amer 56 (*)    All other components within normal limits  CK  URINALYSIS, ROUTINE W REFLEX MICROSCOPIC    ED ECG REPORT   Date: 02/28/2017  Rate:61  Rhythm: normal sinus rhythm  QRS Axis: left  Intervals: QT prolonged  ST/T Wave abnormalities: nonspecific T wave changes  Conduction Disutrbances:none  Narrative Interpretation:   Old EKG Reviewed: unchanged  I have personally reviewed the EKG tracing and agree with the computerized printout as noted.  Radiology Dg Forearm Left  Result Date: 02/28/2017 CLINICAL DATA:  Arm pain secondary to a fall last night EXAM: LEFT FOREARM - 2 VIEW COMPARISON:  None. FINDINGS: There is no evidence of fracture or other focal bone lesions. Slight arthritis of the radiocarpal joint. Soft tissues are unremarkable. IMPRESSION: No acute abnormalities. Electronically Signed   By: Francene Boyers M.D.   On: 02/28/2017 08:57   Ct Head Wo Contrast  Result Date: 02/28/2017 CLINICAL DATA:  Pain after trauma. EXAM: CT HEAD WITHOUT CONTRAST CT MAXILLOFACIAL WITHOUT CONTRAST CT CERVICAL SPINE WITHOUT CONTRAST TECHNIQUE: Multidetector CT imaging of the head, cervical spine, and maxillofacial structures were performed using the standard protocol without intravenous contrast. Multiplanar CT image reconstructions of the cervical spine and maxillofacial structures were also generated. COMPARISON:  None. FINDINGS: CT HEAD FINDINGS Brain: No subdural, epidural, or subarachnoid hemorrhage. Cerebellum, brainstem, and basal cisterns are within normal limits. No mass, mass effect, or midline shift. Moderate to severe white matter changes are identified. No acute cortical ischemia is identified. Vascular: Calcified atherosclerosis is seen in the intracranial portions of the carotid arteries. There is a calcified  fusiform aneurysm of the proximal right MCA measuring 8.8 mm in diameter proximally. Skull: There is a right nasal bone fracture with mild depression on series 8, image 2, incompletely characterized. No other calvarial fractures identified. Other: There is soft tissue swelling over the forehead. No other soft tissue abnormalities identified. CT MAXILLOFACIAL FINDINGS Osseous: A depressed right nasal bone fracture is identified. No other fractures are identified. Orbits: Negative. No traumatic or inflammatory finding. Sinuses: Clear. Soft tissues: Soft tissue swelling is seen over the right forehead and nose. No other soft tissue abnormalities are identified. CT CERVICAL SPINE FINDINGS Alignment: Normal. Skull base and vertebrae: No acute fracture. No primary bone lesion or focal pathologic process. Soft tissues and spinal canal: No prevertebral fluid or swelling. No visible canal hematoma. Disc levels: Multilevel degenerative changes with small anterior and posterior osteophytes. Upper chest: Negative. Other: No other abnormalities are identified. IMPRESSION: 1. 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 2. No infarct, ischemia, or bleed identified within the brain. 3. Depressed right nasal bone fracture. 4. No fracture or traumatic malalignment in the cervical spine. Electronically Signed   By: Gerome Sam III M.D   On: 02/28/2017 09:41   Ct Cervical Spine Wo Contrast  Result Date: 02/28/2017 CLINICAL DATA:  Pain after trauma. EXAM: CT HEAD WITHOUT CONTRAST CT MAXILLOFACIAL WITHOUT CONTRAST CT CERVICAL SPINE WITHOUT CONTRAST TECHNIQUE: Multidetector CT imaging of the head, cervical spine, and maxillofacial structures were performed using the standard protocol without intravenous contrast. Multiplanar CT image reconstructions of the cervical spine and maxillofacial structures were also generated. COMPARISON:  None. FINDINGS: CT  HEAD FINDINGS Brain: No subdural, epidural, or subarachnoid hemorrhage. Cerebellum, brainstem, and basal cisterns are within normal limits. No mass, mass effect, or midline shift. Moderate to severe white matter changes are identified. No acute cortical ischemia is identified. Vascular: Calcified atherosclerosis is seen in the intracranial portions of the carotid arteries. 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 Skull: There is a right nasal bone fracture with mild depression on series 8, image 2, incompletely characterized. No other calvarial fractures identified. Other: There is soft tissue swelling over the forehead. No other soft tissue abnormalities identified. CT MAXILLOFACIAL FINDINGS Osseous: A depressed right nasal bone fracture is identified. No other fractures are identified. Orbits: Negative. No traumatic or inflammatory finding. Sinuses: Clear. Soft tissues: Soft tissue swelling is seen over the right forehead and nose. No other soft tissue abnormalities are identified. CT CERVICAL SPINE FINDINGS Alignment: Normal. Skull base and vertebrae: No acute fracture. No primary bone lesion or focal pathologic process. Soft tissues and spinal canal: No prevertebral fluid or swelling. No visible canal hematoma. Disc levels: Multilevel degenerative changes with small anterior and posterior osteophytes. Upper chest: Negative. Other: No other abnormalities are identified. IMPRESSION: 1. 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 2. No infarct, ischemia, or bleed identified within the brain. 3. Depressed right nasal bone fracture. 4. No fracture or traumatic malalignment in the cervical spine. Electronically Signed   By: Dorise Bullion III M.D   On: 02/28/2017 09:41   Dg Humerus Left  Result Date: 02/28/2017 CLINICAL DATA:  Unwitnessed fall last night.  Left arm pain.  EXAM: LEFT HUMERUS - 2+ VIEW COMPARISON:  None. FINDINGS: There is no evidence of fracture or other focal bone lesions. Slight arthritis of the glenohumeral joint. Soft tissues are unremarkable. IMPRESSION: No acute abnormality. Electronically Signed   By: Lorriane Shire M.D.   On: 02/28/2017 08:56   Dg Hand Complete Left  Result Date: 02/28/2017 CLINICAL DATA:  Left hand pain secondary to a fall last night. EXAM: LEFT HAND - COMPLETE 3+ VIEW COMPARISON:  None. FINDINGS: There is no fracture or dislocation. Slight arthritic changes in the carpal bones and of the radiocarpal joint. Congenital fusion of the lunate and triquetrum. IMPRESSION: No acute abnormalities. Electronically Signed   By: Lorriane Shire M.D.   On: 02/28/2017 08:58   Dg Hand Complete Right  Result Date: 02/28/2017 CLINICAL DATA:  Right hand pain secondary to a fall last night. EXAM: RIGHT HAND - COMPLETE 3+ VIEW COMPARISON:  None. FINDINGS: There is no fracture or dislocation or other acute abnormality. 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 Slight arthritis in the wrist joints as well as at the first and second MCP joints and in the IP joints of the fingers. IMPRESSION: No acute abnormality.  Arthritic changes as described. Electronically Signed   By: Lorriane Shire M.D.   On: 02/28/2017 08:59   Ct Maxillofacial Wo Contrast  Result Date: 02/28/2017 CLINICAL DATA:  Pain after trauma. EXAM: CT HEAD WITHOUT CONTRAST CT MAXILLOFACIAL WITHOUT CONTRAST CT CERVICAL SPINE WITHOUT CONTRAST TECHNIQUE: Multidetector CT imaging of the head, cervical spine, and maxillofacial structures were performed using the standard protocol without intravenous contrast. Multiplanar CT image reconstructions of the cervical spine and maxillofacial structures were also generated. COMPARISON:  None. FINDINGS: CT HEAD FINDINGS Brain: No subdural, epidural, or subarachnoid hemorrhage. Cerebellum, brainstem, and basal cisterns are within normal limits. No mass,  mass effect, or midline shift. Moderate to severe white matter changes are identified. No acute cortical ischemia is identified. Vascular: Calcified atherosclerosis is seen in the intracranial portions of the carotid arteries. 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 Skull: There is a right nasal bone fracture with  mild depression on series 8, image 2, incompletely characterized. No other calvarial fractures identified. Other: There is soft tissue swelling over the forehead. No other soft tissue abnormalities identified. CT MAXILLOFACIAL FINDINGS Osseous: A depressed right nasal bone fracture is identified. No other fractures are identified. Orbits: Negative. No traumatic or inflammatory finding. Sinuses: Clear. Soft tissues: Soft tissue swelling is seen over the right forehead and nose. No other soft tissue abnormalities are identified. CT CERVICAL SPINE FINDINGS Alignment: Normal. Skull base and vertebrae: No acute fracture. No primary bone lesion or focal pathologic process. Soft tissues and spinal canal: No prevertebral fluid or swelling. No visible canal hematoma. Disc levels: Multilevel degenerative changes with small anterior and posterior osteophytes. Upper chest: Negative. Other: No other abnormalities are identified. IMPRESSION: 1. 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 2. No infarct, ischemia, or bleed identified within the brain. 3. Depressed right nasal bone fracture. 4. No fracture or traumatic malalignment in the cervical spine. Electronically Signed   By: Dorise Bullion III M.D   On: 02/28/2017 09:41    Procedures Procedures (including critical care time)  Medications Ordered in ED Medications - No data to display   Initial Impression / Assessment and Plan / ED Course  I have reviewed the triage vital signs and the nursing notes.  Pertinent  labs & imaging results that were available during my care of the patient were reviewed by me and considered in my medical decision making (see chart for details).     Patient seen and examined. Work-up initiated. Awaiting arrival of family for more information.   Vital signs reviewed and are as follows: BP 138/70   Pulse (!) 59   Temp 97.8 F (36.6 C) (Oral)   Resp 16   SpO2 96%   Discussed with Dr. Venora Maples who will see.   Family at bedside, updated on plan. Imaging neg except for nasal bone fx. Incidental calcified aneurysm noted on CT. Sling ordered.   11:51 AM BP was 53/39 with standing, patient was near syncopal. This episode was witnessed by Dr. Venora Maples who was at bedside. Will give fluids and admit. EKG ordered.   12:20 PM Spoke with Dr. Allyson Sabal who will admit.   Final Clinical Impressions(s) / ED Diagnoses   Final diagnoses:  Near syncope  Fall, initial encounter  Contusion of face, initial encounter  Closed fracture of nasal bone, initial encounter  Musculoskeletal pain of left upper extremity   Admit.   New Prescriptions New Prescriptions   No medications on file     Carlisle Cater, Hershal Coria 02/28/17 [place], MD 02/28/17 1630

## 2017-02-28 NOTE — ED Notes (Signed)
Pt tolerated water

## 2017-03-01 ENCOUNTER — Observation Stay (HOSPITAL_BASED_OUTPATIENT_CLINIC_OR_DEPARTMENT_OTHER): Payer: Medicare Other

## 2017-03-01 DIAGNOSIS — R55 Syncope and collapse: Secondary | ICD-10-CM | POA: Diagnosis not present

## 2017-03-01 LAB — COMPREHENSIVE METABOLIC PANEL
ALT: 15 U/L (ref 14–54)
ANION GAP: 5 (ref 5–15)
AST: 27 U/L (ref 15–41)
Albumin: 3.3 g/dL — ABNORMAL LOW (ref 3.5–5.0)
Alkaline Phosphatase: 85 U/L (ref 38–126)
BILIRUBIN TOTAL: 1 mg/dL (ref 0.3–1.2)
BUN: 15 mg/dL (ref 6–20)
CHLORIDE: 110 mmol/L (ref 101–111)
CO2: 24 mmol/L (ref 22–32)
Calcium: 8.3 mg/dL — ABNORMAL LOW (ref 8.9–10.3)
Creatinine, Ser: 0.8 mg/dL (ref 0.44–1.00)
Glucose, Bld: 98 mg/dL (ref 65–99)
POTASSIUM: 3.3 mmol/L — AB (ref 3.5–5.1)
Sodium: 139 mmol/L (ref 135–145)
TOTAL PROTEIN: 6 g/dL — AB (ref 6.5–8.1)

## 2017-03-01 LAB — VITAMIN B12: Vitamin B-12: 234 pg/mL (ref 180–914)

## 2017-03-01 LAB — TROPONIN I

## 2017-03-01 LAB — CBC
HEMATOCRIT: 37.8 % (ref 36.0–46.0)
Hemoglobin: 12.1 g/dL (ref 12.0–15.0)
MCH: 31.4 pg (ref 26.0–34.0)
MCHC: 32 g/dL (ref 30.0–36.0)
MCV: 98.2 fL (ref 78.0–100.0)
Platelets: 165 10*3/uL (ref 150–400)
RBC: 3.85 MIL/uL — AB (ref 3.87–5.11)
RDW: 13.1 % (ref 11.5–15.5)
WBC: 7.2 10*3/uL (ref 4.0–10.5)

## 2017-03-01 LAB — AMMONIA: Ammonia: 27 umol/L (ref 9–35)

## 2017-03-01 LAB — ECHOCARDIOGRAM COMPLETE

## 2017-03-01 MED ORDER — POTASSIUM CHLORIDE CRYS ER 20 MEQ PO TBCR
40.0000 meq | EXTENDED_RELEASE_TABLET | Freq: Once | ORAL | Status: AC
  Administered 2017-03-01: 40 meq via ORAL
  Filled 2017-03-01: qty 2

## 2017-03-01 MED ORDER — BROMFENAC SODIUM 0.07 % OP SOLN
1.0000 [drp] | Freq: Every day | OPHTHALMIC | Status: DC
  Administered 2017-03-01: 1 [drp] via OPHTHALMIC

## 2017-03-01 MED ORDER — DIFLUPREDNATE 0.05 % OP EMUL: 1.0000 [drp] | Freq: Every day | OPHTHALMIC | Status: DC

## 2017-03-01 MED ORDER — BESIFLOXACIN HCL 0.6 % OP SUSP
1.0000 [drp] | Freq: Three times a day (TID) | OPHTHALMIC | Status: DC
  Administered 2017-03-01 – 2017-03-02 (×4): 1 [drp] via OPHTHALMIC

## 2017-03-01 MED ORDER — DICLOFENAC SODIUM 1 % TD GEL
2.0000 g | Freq: Four times a day (QID) | TRANSDERMAL | Status: DC | PRN
  Administered 2017-03-01 – 2017-03-02 (×3): 2 g via TOPICAL
  Filled 2017-03-01: qty 100

## 2017-03-01 MED ORDER — DIFLUPREDNATE 0.05 % OP EMUL
1.0000 [drp] | Freq: Two times a day (BID) | OPHTHALMIC | Status: DC
  Administered 2017-03-01 – 2017-03-02 (×2): 1 [drp] via OPHTHALMIC

## 2017-03-01 MED ORDER — LORAZEPAM 2 MG/ML IJ SOLN
1.0000 mg | Freq: Once | INTRAMUSCULAR | Status: AC
  Administered 2017-03-01: 1 mg via INTRAVENOUS
  Filled 2017-03-01: qty 1

## 2017-03-01 NOTE — Evaluation (Signed)
Physical Therapy Evaluation Patient Details Name: Pamela Choi MRN: [id number] DOB: [birth year] Today's Date: 03/01/2017   History of Present Illness  83 year old female with a past medical history of dementia, hypertension, dyslipidemia,R infiltrating ductal carcinoma of breast s/p radiation therapy > 10 years ago,  pericarditis, anxiety, depression, who lives alone, next to her daughter and her son-in-law, who presents to ED  after a fall. xrays neg  Clinical Impression  Pt admitted with above diagnosis. Pt currently with functional limitations due to the deficits listed below (see PT Problem List).  Pt will benefit from skilled PT to increase their independence and safety with mobility to allow discharge to the venue listed below.  Pt requiring max/total assist for bed mobility, greatly limited by UE pain with movement; Will follow, recommend SNF    Follow Up Recommendations SNF    Equipment Recommendations  None recommended by PT    Recommendations for Other Services       Precautions / Restrictions Precautions Precautions: Fall Restrictions Weight Bearing Restrictions: No      Mobility  Bed Mobility Overal bed mobility: Needs Assistance Bed Mobility: Supine to Sit;Sit to Supine     Supine to sit: Max assist Sit to supine: Max assist;Total assist   General bed mobility comments: assist with trunk and LEs on//off bed  Transfers Overall transfer level: Needs assistance Equipment used: Rolling walker (2 wheeled) Transfers: Sit to/from Stand Sit to Stand: Mod assist;Max assist         General transfer comment: pt unable to come to full stand d/t pain UEs and LE weakness  Ambulation/Gait                Stairs            Wheelchair Mobility    Modified Rankin (Stroke Patients Only)       Balance Overall balance assessment: Needs assistance;History of Falls   Sitting balance-Leahy Scale: Poor Sitting balance - Comments: repeated posterior  LOB, assist to maintain midline Postural control: Posterior lean                                   Pertinent Vitals/Pain Pain Assessment: Faces Faces Pain Scale: Hurts whole lot Pain Location: bil UE pain Pain Descriptors / Indicators: Grimacing Pain Intervention(s): Limited activity within patient's tolerance;Monitored during session;Repositioned    Home Living Family/patient expects to be discharged to:: Private residence Living Arrangements: Alone (dtr and son-in-law check on her)               Additional Comments: pt unable to give info    Prior Function                 Hand Dominance        Extremity/Trunk Assessment   Upper Extremity Assessment Upper Extremity Assessment: Difficult to assess due to impaired cognition;LUE deficits/detail;RUE deficits/detail RUE: Unable to fully assess due to pain LUE: Unable to fully assess due to pain    Lower Extremity Assessment Lower Extremity Assessment: Generalized weakness       Communication   Communication: No difficulties  Cognition   Behavior During Therapy: WFL for tasks assessed/performed Overall Cognitive Status: History of cognitive impairments - at baseline Area of Impairment: Orientation;Following commands;Memory Orientation Level: Disoriented to;Time;Place;Situation   Memory: Decreased short-term memory Following Commands: Follows one step commands with increased time            General  Comments      Exercises     Assessment/Plan    PT Assessment Patient needs continued PT services  PT Problem List Decreased strength;Decreased range of motion;Decreased activity tolerance;Decreased balance;Decreased cognition;Decreased coordination;Decreased mobility;Decreased knowledge of use of DME;Pain       PT Treatment Interventions DME instruction;Gait training;Functional mobility training;Therapeutic activities;Therapeutic exercise;Patient/family education;Balance training    PT  Goals (Current goals can be found in the Care Plan section)  Acute Rehab PT Goals Patient Stated Goal: none stated PT Goal Formulation: With patient Time For Goal Achievement: 03/08/17 Potential to Achieve Goals: Good    Frequency Min 3X/week   Barriers to discharge        Co-evaluation               End of Session Equipment Utilized During Treatment: Gait belt Activity Tolerance: Patient tolerated treatment well Patient left: with call bell/phone within reach;in bed;with bed alarm set Nurse Communication: Mobility status PT Visit Diagnosis: History of falling (Z91.81)    Functional Assessment Tool Used: AM-PAC 6 Clicks Basic Mobility;Clinical judgement Functional Limitation: Changing and maintaining body position Changing and Maintaining Body Position Current Status [id number]): At least 80 percent but less than 100 percent impaired, limited or restricted Changing and Maintaining Body Position Goal Status [id number]): At least 40 percent but less than 60 percent impaired, limited or restricted    Time: [id number] PT Time Calculation (min) (ACUTE ONLY): 17 min   Charges:   PT Evaluation $PT Eval Moderate Complexity: 1 Procedure     PT G Codes:   PT G-Codes **NOT FOR INPATIENT CLASS** Functional Assessment Tool Used: AM-PAC 6 Clicks Basic Mobility;Clinical judgement Functional Limitation: Changing and maintaining body position Changing and Maintaining Body Position Current Status [id number]): At least 80 percent but less than 100 percent impaired, limited or restricted Changing and Maintaining Body Position Goal Status [id number]): At least 40 percent but less than 60 percent impaired, limited or restricted     [facility] 03/01/2017, 4:42 PM

## 2017-03-01 NOTE — Progress Notes (Signed)
  Echocardiogram 2D Echocardiogram has been performed.  Pamela Choi 03/01/2017, 12:54 PM

## 2017-03-01 NOTE — Progress Notes (Signed)
PROGRESS NOTE    Pamela Choi  [id number] DOB: 1930-06-13 DOA: 02/28/2017 PCP: Lucretia Kern., DO   Brief Narrative:  83 year old female with a past medical history of dementia, hypertension, dyslipidemia,R infiltrating ductal carcinoma of breast s/p radiation therapy > 10 years ago,  pericarditis, anxiety, depression, who lives alone, next to her daughter and her son-in-law, who presents to ED  after a fall. Patient was found by her daughter on the floor at around 6 AM in the morning.She has been well enough to perform most of the ADLs by herself and lives independently. When the patient arrived to the ER, she was found to have abrasions on her nose, head, bruising of her left periorbital area. She complained of excruciating left upper extremity pain. Patient is unable to recall the events but patient's daughter states that she probably fell this morning. When the daughter found her she was sitting on the floor, and in a lot of pain. A week ago patient was seen by PCP for rectal bleeding which was attribute it to hemorrhoids. She has not had any recurrent bleeding since. Daughter denies any dysarthria, if easier, focal weakness this morning ED course BP 138/70  Pulse (!) 59  Temp 97.8 F (36.6 C) (Oral)  Resp 16  SpO2 96% . At one point patient systolic blood pressure dropped into the 60s. Patient had a syncopal episode when she attempted to stand and blood pressure dropped into the 60s. Imaging studies including left humerus, left forearm, left hand, right hand shows no acute abnormality. Due to pain in her left upper extremity has been placed in a sling. 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 EKG shows T-wave inversions which are not new  Patient has no prior history of any brain aneurysm. Patient is being admitted for further evaluation of a syncopal episode which  was unwitnessed, evaluation of her orthostatic hypotension and brain aneurysm.    Assessment & Plan:   Principal Problem:   Syncope and collapse Active Problems:   Hyperlipemia   Essential hypertension   Memory loss   1-Fall, presume Syncope; related to orthostatic.  Continue with IV fluids.  Continue to hold BP medications.  PT evaluation.  ECHO normal EF, Troponin negative.   2-Left Arm pain;  Pain improved.  X ray negative.  Voltaren cream ordered.   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  MRA; stable aneurysmal. Discussed MRA results with neurology on call. Patient just need Follow up. Aneurysmal is stable.   4-Bradycardia; hold atenolol and Aricept.   Hyperlipemia-continue statin  Essential hypertension-management as above  Recent cataract surgery- continue with  her outpatient eyedrops    Memory loss/history of dementia May need placement on discharge TSH normal.  I will check B 12, ammonia.    DVT prophylaxis: Lovenox.  Code Status: Full code.  Family Communication: Daughter at bedside.  Disposition Plan:  To be determine, might need placement. PT , OT evaluation.    Consultants:   none   Procedures:  ECHO : Compared to a prior study in 2016, the LVEF is higher at 60-65%,  however, there is now moderate LVH, normal LAE, mild posteriorly directed AI and no pericardial effusion.  Antimicrobials:  none  Subjective: Patient denies headaches or chest pain.  Daughter at bedside, report patient has been more forgetfull, has dementia.  Objective: Vitals:   02/28/17 1717 02/28/17 1800 02/28/17 2047 03/01/17 1441  BP: 146/71 (!) 158/65 (!) 142/62 (!) 142/72  Pulse: 69 63 69 67  Resp: 18 18 18 20   Temp:  99.7 F (37.6 C) 99.8 F (37.7 C) 98.9 F (37.2 C)  TempSrc:  Oral Oral Oral  SpO2: 94% 95% 94% 93%    Intake/Output Summary (Last 24 hours) at 03/01/17 1512 Last data filed at 03/01/17  1507  Gross per 24 hour  Intake           1917.5 ml  Output                0 ml  Net           1917.5 ml   There were no vitals filed for this visit.  Examination:  General exam: Appears calm and comfortable hematomas face, periorbital  Respiratory system: Clear to auscultation. Respiratory effort normal. Cardiovascular system: S1 & S2 heard, RRR. No JVD, murmurs, rubs, gallops or clicks. No pedal edema. Gastrointestinal system: Abdomen is nondistended, soft and nontender. No organomegaly or masses felt. Normal bowel sounds heard. Central nervous system: Alert and oriented. No focal neurological deficits. Extremities: Symmetric 5 x 5 power. Skin: No rashes, lesions or ulcers     Data Reviewed: I have personally reviewed following labs and imaging studies  CBC:  Recent Labs Lab 02/28/17 0918 03/01/17 0716  WBC 12.5* 7.2  NEUTROABS 9.9*  --   HGB 13.0 12.1  HCT 40.0 37.8  MCV 98.8 98.2  PLT 176 [id number]   Basic Metabolic Panel:  Recent Labs Lab 02/28/17 0918 02/28/17 1315 03/01/17 0716  NA 142  --  139  K 3.3*  --  3.3*  CL 110  --  110  CO2 24  --  24  GLUCOSE 93  --  98  BUN 25*  --  15  CREATININE 1.03*  --  0.80  CALCIUM 8.9  --  8.3*  MG  --  1.9  --    GFR: Estimated Creatinine Clearance: 51.1 mL/min (by C-G formula based on SCr of 0.8 mg/dL). Liver Function Tests:  Recent Labs Lab 03/01/17 0716  AST 27  ALT 15  ALKPHOS 85  BILITOT 1.0  PROT 6.0*  ALBUMIN 3.3*   No results for input(s): LIPASE, AMYLASE in the last 168 hours. No results for input(s): AMMONIA in the last 168 hours. Coagulation Profile: No results for input(s): INR, PROTIME in the last 168 hours. Cardiac Enzymes:  Recent Labs Lab 02/28/17 0918 02/28/17 1315 02/28/17 1837 03/01/17 0103  CKTOTAL 160  --   --   --   TROPONINI  --  <0.03 <0.03 <0.03   BNP (last 3 results) No results for input(s): PROBNP in the last 8760 hours. HbA1C: No results for input(s): HGBA1C in the  last 72 hours. CBG: No results for input(s): GLUCAP in the last 168 hours. Lipid Profile: No results for input(s): CHOL, HDL, LDLCALC, TRIG, CHOLHDL, LDLDIRECT in the last 72 hours. Thyroid Function Tests:  Recent Labs  02/28/17 2038  TSH 2.303   Anemia Panel: No results for input(s): VITAMINB12, FOLATE, FERRITIN, TIBC, IRON, RETICCTPCT in the last 72 hours. Sepsis Labs: No results for input(s): PROCALCITON, LATICACIDVEN in the last 168 hours.  No results found for this or any previous visit (from the past 240 hour(s)).       Radiology Studies: Dg Chest 2 View  Result Date: 02/28/2017 CLINICAL DATA:  Status post he fall.  Mental status change. History of hypertension and hyperlipidemia and former smoker. Right breast malignancy. EXAM: CHEST  2 VIEW COMPARISON:  Chest x-ray of July 18, 2015 FINDINGS: The lungs are adequately inflated. There is no focal infiltrate. There is no evidence of a pulmonary contusion, pleural effusion, or pneumothorax. The cardiac silhouette is mildly enlarged. The pulmonary vascularity is not engorged. There is calcification in the wall of the aortic arch. The trachea is midline. The bony thorax exhibits no acute abnormality. IMPRESSION: There is no evidence of acute cardiopulmonary abnormality or acute thoracic injury. Thoracic aortic atherosclerosis. Mild cardiomegaly is likely projectional related to the AP technique Electronically Signed   By: David  Martinique M.D.   On: 02/28/2017 12:38   Dg Forearm Left  Result Date: 02/28/2017 CLINICAL DATA:  Arm pain secondary to a fall last night EXAM: LEFT FOREARM - 2 VIEW COMPARISON:  None. FINDINGS: There is no evidence of fracture or other focal bone lesions. Slight arthritis of the radiocarpal joint. Soft tissues are unremarkable. IMPRESSION: No acute abnormalities. Electronically Signed   By: Lorriane Shire M.D.   On: 02/28/2017 08:57   Ct Head Wo Contrast  Result Date: 02/28/2017 CLINICAL DATA:  Pain after trauma.  EXAM: CT HEAD WITHOUT CONTRAST CT MAXILLOFACIAL WITHOUT CONTRAST CT CERVICAL SPINE WITHOUT CONTRAST TECHNIQUE: Multidetector CT imaging of the head, cervical spine, and maxillofacial structures were performed using the standard protocol without intravenous contrast. Multiplanar CT image reconstructions of the cervical spine and maxillofacial structures were also generated. COMPARISON:  None. FINDINGS: CT HEAD FINDINGS Brain: No subdural, epidural, or subarachnoid hemorrhage. Cerebellum, brainstem, and basal cisterns are within normal limits. No mass, mass effect, or midline shift. Moderate to severe white matter changes are identified. No acute cortical ischemia is identified. Vascular: Calcified atherosclerosis is seen in the intracranial portions of the carotid arteries. 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 Skull: There is a right nasal bone fracture with mild depression on series 8, image 2, incompletely characterized. No other calvarial fractures identified. Other: There is soft tissue swelling over the forehead. No other soft tissue abnormalities identified. CT MAXILLOFACIAL FINDINGS Osseous: A depressed right nasal bone fracture is identified. No other fractures are identified. Orbits: Negative. No traumatic or inflammatory finding. Sinuses: Clear. Soft tissues: Soft tissue swelling is seen over the right forehead and nose. No other soft tissue abnormalities are identified. CT CERVICAL SPINE FINDINGS Alignment: Normal. Skull base and vertebrae: No acute fracture. No primary bone lesion or focal pathologic process. Soft tissues and spinal canal: No prevertebral fluid or swelling. No visible canal hematoma. Disc levels: Multilevel degenerative changes with small anterior and posterior osteophytes. Upper chest: Negative. Other: No other abnormalities are identified. IMPRESSION: 1. 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 2. No infarct, ischemia, or bleed identified within the brain. 3. Depressed right nasal bone fracture. 4. No fracture or traumatic malalignment in the cervical spine. Electronically Signed   By: Dorise Bullion III M.D   On: 02/28/2017 09:41   Ct Cervical Spine Wo Contrast  Result Date: 02/28/2017 CLINICAL DATA:  Pain after trauma. EXAM: CT HEAD WITHOUT CONTRAST CT MAXILLOFACIAL WITHOUT CONTRAST CT CERVICAL SPINE WITHOUT CONTRAST TECHNIQUE: Multidetector CT imaging of the head, cervical spine, and maxillofacial structures were performed using the standard protocol without intravenous contrast. Multiplanar CT image reconstructions of the cervical spine and maxillofacial structures  were also generated. COMPARISON:  None. FINDINGS: CT HEAD FINDINGS Brain: No subdural, epidural, or subarachnoid hemorrhage. Cerebellum, brainstem, and basal cisterns are within normal limits. No mass, mass effect, or midline shift. Moderate to severe white matter changes are identified. No acute cortical ischemia is identified. Vascular: Calcified atherosclerosis is seen in the intracranial portions of the carotid arteries. 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 Skull: There is a right nasal bone fracture with mild depression on series 8, image 2, incompletely characterized. No other calvarial fractures identified. Other: There is soft tissue swelling over the forehead. No other soft tissue abnormalities identified. CT MAXILLOFACIAL FINDINGS Osseous: A depressed right nasal bone fracture is identified. No other fractures are identified. Orbits: Negative. No traumatic or inflammatory finding. Sinuses: Clear. Soft tissues: Soft tissue swelling is seen over the right forehead and nose. No other soft tissue abnormalities are identified. CT CERVICAL SPINE FINDINGS Alignment: Normal. Skull base and vertebrae: No acute  fracture. No primary bone lesion or focal pathologic process. Soft tissues and spinal canal: No prevertebral fluid or swelling. No visible canal hematoma. Disc levels: Multilevel degenerative changes with small anterior and posterior osteophytes. Upper chest: Negative. Other: No other abnormalities are identified. IMPRESSION: 1. 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 2. No infarct, ischemia, or bleed identified within the brain. 3. Depressed right nasal bone fracture. 4. No fracture or traumatic malalignment in the cervical spine. Electronically Signed   By: Dorise Bullion III M.D   On: 02/28/2017 09:41   Mr Jodene Nam Head Wo Contrast  Result Date: 02/28/2017 CLINICAL DATA:  Fall.  Pain after trauma. EXAM: MRI HEAD WITHOUT CONTRAST MRA HEAD WITHOUT CONTRAST TECHNIQUE: Multiplanar, multiecho pulse sequences of the brain and surrounding structures were obtained without intravenous contrast. Angiographic images of the head were obtained using MRA technique without contrast. COMPARISON:  CT head without contrast from today. MRI of the brain 06/02/2013. FINDINGS: MRI HEAD FINDINGS Brain: The diffusion-weighted images demonstrate no acute or subacute infarction. Sagittal T1 weighted images demonstrate no acute hemorrhage. Enlarged empty sella is again seen. Mild atrophy is stable. No acute hemorrhage or mass lesion is present. Vascular: Flow is present in major intracranial arteries. Aneurysmal dilation of the proximal right M1 segment is stable. Skull and upper cervical spine: Craniocervical junction is normal. Marrow signal is normal. The upper cervical spine is unremarkable. Midline sagittal structures are otherwise unremarkable. Sinuses/Orbits: The paranasal sinuses and mastoid air cells are clear. The globes and orbits are within normal limits. MRA HEAD FINDINGS Time-of-flight images demonstrate fusiform aneurysmal dilation of right ICA  terminus, measuring up to 8 mm, similar to the prior exam The left ICA terminus is normal. Left A1 and M1 segments are normal. There is some attenuation of distal A1 M1 segments on the right. ACA and MCA branch vessels are attenuated bilaterally. The right vertebral artery is the dominant vessel. PICA origins are not well visualized. The basilar artery is small. Fetal type posterior cerebral arteries are present bilaterally. A small P1 segment contributes on the left. There is attenuation of distal PCA branch vessels bilaterally. IMPRESSION: 1. Aneurysmal dilation of the terminal right ICA is similar to the prior exams. 2. Mild diffuse distal small vessel disease. 3. No acute intracranial abnormality.  No evidence for acute trauma. Electronically Signed   By: San Morelle M.D.   On: 02/28/2017 16:24  Mr Brain Wo Contrast  Result Date: 02/28/2017 CLINICAL DATA:  Fall.  Pain after trauma. EXAM: MRI HEAD WITHOUT CONTRAST MRA HEAD WITHOUT CONTRAST TECHNIQUE: Multiplanar, multiecho pulse sequences of the brain and surrounding structures were obtained without intravenous contrast. Angiographic images of the head were obtained using MRA technique without contrast. COMPARISON:  CT head without contrast from today. MRI of the brain 06/02/2013. FINDINGS: MRI HEAD FINDINGS Brain: The diffusion-weighted images demonstrate no acute or subacute infarction. Sagittal T1 weighted images demonstrate no acute hemorrhage. Enlarged empty sella is again seen. Mild atrophy is stable. No acute hemorrhage or mass lesion is present. Vascular: Flow is present in major intracranial arteries. Aneurysmal dilation of the proximal right M1 segment is stable. Skull and upper cervical spine: Craniocervical junction is normal. Marrow signal is normal. The upper cervical spine is unremarkable. Midline sagittal structures are otherwise unremarkable. Sinuses/Orbits: The paranasal sinuses and mastoid air cells are clear. The globes and orbits  are within normal limits. MRA HEAD FINDINGS Time-of-flight images demonstrate fusiform aneurysmal dilation of right ICA terminus, measuring up to 8 mm, similar to the prior exam The left ICA terminus is normal. Left A1 and M1 segments are normal. There is some attenuation of distal A1 M1 segments on the right. ACA and MCA branch vessels are attenuated bilaterally. The right vertebral artery is the dominant vessel. PICA origins are not well visualized. The basilar artery is small. Fetal type posterior cerebral arteries are present bilaterally. A small P1 segment contributes on the left. There is attenuation of distal PCA branch vessels bilaterally. IMPRESSION: 1. Aneurysmal dilation of the terminal right ICA is similar to the prior exams. 2. Mild diffuse distal small vessel disease. 3. No acute intracranial abnormality.  No evidence for acute trauma. Electronically Signed   By: San Morelle M.D.   On: 02/28/2017 16:24   Dg Humerus Left  Result Date: 02/28/2017 CLINICAL DATA:  Unwitnessed fall last night.  Left arm pain. EXAM: LEFT HUMERUS - 2+ VIEW COMPARISON:  None. FINDINGS: There is no evidence of fracture or other focal bone lesions. Slight arthritis of the glenohumeral joint. Soft tissues are unremarkable. IMPRESSION: No acute abnormality. Electronically Signed   By: Lorriane Shire M.D.   On: 02/28/2017 08:56   Dg Hand Complete Left  Result Date: 02/28/2017 CLINICAL DATA:  Left hand pain secondary to a fall last night. EXAM: LEFT HAND - COMPLETE 3+ VIEW COMPARISON:  None. FINDINGS: There is no fracture or dislocation. Slight arthritic changes in the carpal bones and of the radiocarpal joint. Congenital fusion of the lunate and triquetrum. IMPRESSION: No acute abnormalities. Electronically Signed   By: Lorriane Shire M.D.   On: 02/28/2017 08:58   Dg Hand Complete Right  Result Date: 02/28/2017 CLINICAL DATA:  Right hand pain secondary to a fall last night. EXAM: RIGHT HAND - COMPLETE 3+ VIEW  COMPARISON:  None. FINDINGS: There is no fracture or dislocation or other acute abnormality. 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 Slight arthritis in the wrist joints as well as at the first and second MCP joints and in the IP joints of the fingers. IMPRESSION: No acute abnormality.  Arthritic changes as described. Electronically Signed   By: Lorriane Shire M.D.   On: 02/28/2017 08:59   Ct Maxillofacial Wo Contrast  Result Date: 02/28/2017 CLINICAL DATA:  Pain after trauma. EXAM: CT HEAD WITHOUT CONTRAST CT MAXILLOFACIAL WITHOUT CONTRAST CT CERVICAL SPINE WITHOUT CONTRAST TECHNIQUE: Multidetector CT imaging of the head, cervical spine, and maxillofacial structures  were performed using the standard protocol without intravenous contrast. Multiplanar CT image reconstructions of the cervical spine and maxillofacial structures were also generated. COMPARISON:  None. FINDINGS: CT HEAD FINDINGS Brain: No subdural, epidural, or subarachnoid hemorrhage. Cerebellum, brainstem, and basal cisterns are within normal limits. No mass, mass effect, or midline shift. Moderate to severe white matter changes are identified. No acute cortical ischemia is identified. Vascular: Calcified atherosclerosis is seen in the intracranial portions of the carotid arteries. 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 Skull: There is a right nasal bone fracture with mild depression on series 8, image 2, incompletely characterized. No other calvarial fractures identified. Other: There is soft tissue swelling over the forehead. No other soft tissue abnormalities identified. CT MAXILLOFACIAL FINDINGS Osseous: A depressed right nasal bone fracture is identified. No other fractures are identified. Orbits: Negative. No traumatic or inflammatory finding. Sinuses: Clear. Soft tissues: Soft tissue swelling is seen over the right forehead and nose. No other soft tissue abnormalities  are identified. CT CERVICAL SPINE FINDINGS Alignment: Normal. Skull base and vertebrae: No acute fracture. No primary bone lesion or focal pathologic process. Soft tissues and spinal canal: No prevertebral fluid or swelling. No visible canal hematoma. Disc levels: Multilevel degenerative changes with small anterior and posterior osteophytes. Upper chest: Negative. Other: No other abnormalities are identified. IMPRESSION: 1. 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 2. No infarct, ischemia, or bleed identified within the brain. 3. Depressed right nasal bone fracture. 4. No fracture or traumatic malalignment in the cervical spine. Electronically Signed   By: Dorise Bullion III M.D   On: 02/28/2017 09:41        Scheduled Meds: . Besifloxacin HCl  1 drop Ophthalmic TID  . Bromfenac Sodium  1 drop Right Eye QHS  . Difluprednate  1 drop Ophthalmic BID   Followed by  . [START ON 03/09/2017] Difluprednate  1 drop Ophthalmic Daily  . donepezil  10 mg Oral QHS  . enoxaparin (LOVENOX) injection  40 mg Subcutaneous Q24H  . escitalopram  5 mg Oral Daily  . pravastatin  40 mg Oral Daily   Continuous Infusions: . sodium chloride 75 mL/hr at 03/01/17 1507     LOS: 0 days    Time spent: 35 minutes.     Elmarie Shiley, MD [facility] Pager [phone]  If 7PM-7AM, please contact night-coverage [email or web address] Password [email or web address] 03/01/2017, 3:12 PM

## 2017-03-02 ENCOUNTER — Ambulatory Visit: Payer: Medicare Other

## 2017-03-02 DIAGNOSIS — R55 Syncope and collapse: Secondary | ICD-10-CM | POA: Diagnosis not present

## 2017-03-02 MED ORDER — IBUPROFEN 200 MG PO TABS
400.0000 mg | ORAL_TABLET | ORAL | Status: DC | PRN
  Administered 2017-03-02: 400 mg via ORAL
  Filled 2017-03-02: qty 2

## 2017-03-02 MED ORDER — COLCHICINE 0.6 MG PO TABS: 0.6000 mg | ORAL_TABLET | Freq: Every day | ORAL | Status: DC

## 2017-03-02 MED ORDER — LORAZEPAM 2 MG/ML IJ SOLN
1.0000 mg | Freq: Once | INTRAMUSCULAR | Status: AC
  Administered 2017-03-02: 1 mg via INTRAVENOUS
  Filled 2017-03-02: qty 1

## 2017-03-02 MED ORDER — HYDRALAZINE HCL 20 MG/ML IJ SOLN
5.0000 mg | INTRAMUSCULAR | Status: DC | PRN
  Administered 2017-03-02: 5 mg via INTRAVENOUS
  Filled 2017-03-02: qty 1

## 2017-03-02 MED ORDER — COLCHICINE 0.6 MG PO TABS
1.2000 mg | ORAL_TABLET | Freq: Every day | ORAL | Status: AC
  Administered 2017-03-02: 1.2 mg via ORAL
  Filled 2017-03-02: qty 2

## 2017-03-02 MED ORDER — OXYCODONE HCL 5 MG PO TABS: 5.0000 mg | ORAL_TABLET | ORAL | 0 refills | Status: DC | PRN

## 2017-03-02 MED ORDER — AMLODIPINE BESYLATE 5 MG PO TABS
5.0000 mg | ORAL_TABLET | Freq: Every day | ORAL | Status: DC
  Administered 2017-03-02: 5 mg via ORAL
  Filled 2017-03-02: qty 1

## 2017-03-02 MED ORDER — ATENOLOL 50 MG PO TABS: 25.0000 mg | ORAL_TABLET | Freq: Every day | ORAL | 1 refills | Status: DC

## 2017-03-02 MED ORDER — LORAZEPAM 1 MG PO TABS
1.0000 mg | ORAL_TABLET | Freq: Once | ORAL | Status: DC
  Filled 2017-03-02: qty 1

## 2017-03-02 NOTE — Clinical Social Work Placement (Addendum)
[facility] was scheduled for 3:00 pick up.   CLINICAL SOCIAL WORK PLACEMENT  NOTE  Date:  03/02/2017  Patient Details  Name: Pamela Choi MRN: [id number] Date of Birth: 1930-02-02  Clinical Social Work is seeking post-discharge placement for this patient at the [place] level of care (*CSW will initial, date and re-position this form in  chart as items are completed):  Yes   Patient/family provided with [facility] Work Department's list of facilities offering this level of care within the geographic area requested by the patient (or if unable, by the patient's family).  Yes   Patient/family informed of their freedom to choose among providers that offer the needed level of care, that participate in Medicare, Medicaid or managed care program needed by the patient, have an available bed and are willing to accept the patient.  Yes   Patient/family informed of [facility]'s ownership interest in [facility] and [facility], as well as of the fact that they are under no obligation to receive care at these facilities.  PASRR submitted to EDS on 03/02/17     PASRR number received on 03/02/17     Existing PASRR number confirmed on       FL2 transmitted to all facilities in geographic area requested by pt/family on       FL2 transmitted to all facilities within larger geographic area on       Patient informed that his/her managed care company has contracts with or will negotiate with certain facilities, including the following:  [facility], [facility]     Yes   Patient/family informed of bed offers received.  Patient chooses bed at [place], [place]     Physician recommends and patient chooses bed at      Patient to be transferred to [place], [place] on 03/02/17.  Patient to be transferred to facility by [facility]     Patient family notified on 03/02/17 of transfer.  Name of family member notified:  [name]      PHYSICIAN Please prepare priority discharge summary, including medications     Additional Comment:    _______________________________________________ Lia Hopping, LCSW 03/02/2017, 11:38 AM

## 2017-03-02 NOTE — Clinical Social Work Note (Signed)
Clinical Social Work Assessment  Patient Details  Name: Pamela Choi MRN: [id number] Date of Birth: 1930/04/25  Date of referral:  03/02/17               Reason for consult:  Facility Placement                Permission sought to share information with:  Facility Sport and exercise psychologist, Family Supports Permission granted to share information::  Yes, Verbal Permission Granted  Name::        Agency::     Relationship::     Contact Information:     Housing/Transportation Living arrangements for the past 2 months:  [place] of Information:  Adult Children Patient Interpreter Needed:  None Criminal Activity/Legal Involvement Pertinent to Current Situation/Hospitalization:  No - Comment as needed Significant Relationships:  Adult Children Lives with:  Self Do you feel safe going back to the place where you live?  Yes Need for family participation in patient care:  Yes (Comment)  Care giving concerns:  SNF placement for rehab   Social Worker assessment / plan: Met patient at bedside, pt. Confused requested CSW speak to son.   LCSWA spoke with patient son by phone, explain role and reason for CSW intervention. LCSWA informed him PT has recommended SNF placement for patient, at this time patient is requiring assistance with ambulating. Patient son is agreeable to rehab placement.  Plan: Complete Assessment, FL2, PASRR, Offer beds.   Employment status:  Retired Nurse, adult PT Recommendations:  [name] / Referral to community resources:  [place]  Patient/Family's Response to care: Patient and family receptive to rehab intervention before patient returns home.   Patient/Family's Understanding of and Emotional Response to Diagnosis, Current Treatment, and Prognosis:  Patient family helps to take care of patient at home. They understands the patient is weak and needs rehab  intervention.  Emotional Assessment Appearance:  Developmentally appropriate Attitude/Demeanor/Rapport:    Affect (typically observed):  Accepting Orientation:  Oriented to Self Alcohol / Substance use:  Not Applicable Psych involvement (Current and /or in the community):  No (Comment)  Discharge Needs  Concerns to be addressed:  Care Coordination, Discharge Planning Concerns Readmission within the last 30 days:  No Current discharge risk:  None Barriers to Discharge:  Continued Medical Work up   [employer], LCSW 03/02/2017, 10:12 AM

## 2017-03-02 NOTE — Progress Notes (Signed)
Pt discharged from the unit via PTAR. Discharge instructions and pt belongings were sent with family. Report called to [facility]. No questions or concerns at this time.  Kiyomi Pallo W Armoni Depass, RN

## 2017-03-02 NOTE — NC FL2 (Signed)
[place] LEVEL OF CARE SCREENING TOOL     IDENTIFICATION  Patient Name: Pamela Choi Birthdate: Nov 29, 1930 Sex: female Admission Date (Current Location): 02/28/2017  [facility] and [place] Number:  Herbalist and Address:  [facility],  [place] [address], [place]      Provider Number: [id number]  Attending Physician Name and Address:  Caren Griffins, MD  Relative Name and Phone Number:       Current Level of Care: SNF Recommended Level of Care:   Prior Approval Number:    Date Approved/Denied:   PASRR Number:    Discharge Plan: SNF    Current Diagnoses: Patient Active Problem List   Diagnosis Date Noted  . Syncope and collapse 02/28/2017  . Recurrent major depressive disorder, in full remission ([place]) 12/30/2016  . Memory loss 02/22/2013  . Hyperlipemia 01/11/2007  . Essential hypertension 11/08/2006    Orientation RESPIRATION BLADDER Height & Weight     Self  Normal Incontinent Weight:   Height:     BEHAVIORAL SYMPTOMS/MOOD NEUROLOGICAL BOWEL NUTRITION STATUS      Continent Diet (Regular)  AMBULATORY STATUS COMMUNICATION OF NEEDS Skin   Extensive Assist Verbally Normal                       Personal Care Assistance Level of Assistance  Bathing, Feeding, Dressing Bathing Assistance: Limited assistance Feeding assistance: Independent Dressing Assistance: Limited assistance     Functional Limitations Info  Sight, Hearing, Speech Sight Info: Adequate Hearing Info: Adequate Speech Info: Adequate    SPECIAL CARE FACTORS FREQUENCY  PT (By licensed PT)     PT Frequency: 5              Contractures Contractures Info: Not present    Additional Factors Info  Code Status, Allergies Code Status Info: Fullcode Allergies Info: Bee Venom           Current Medications (03/02/2017):  This is the current hospital active medication list Current Facility-Administered Medications  Medication Dose  Route Frequency Provider Last Rate Last Dose  . acetaminophen (TYLENOL) tablet 650 mg  650 mg Oral Q6H PRN Reyne Dumas, MD       Or  . acetaminophen (TYLENOL) suppository 650 mg  650 mg Rectal Q6H PRN Reyne Dumas, MD      . Besifloxacin HCl 0.6 % SUSP 1 drop  1 drop Ophthalmic TID Elmarie Shiley, MD   1 drop at 03/01/17 2150  . Bromfenac Sodium 0.07 % SOLN 1 drop  1 drop Right Eye QHS Belkys A Regalado, MD   1 drop at 03/01/17 2150  . [START ON 03/03/2017] colchicine tablet 0.6 mg  0.6 mg Oral Daily Costin Karlyne Greenspan, MD      . colchicine tablet 1.2 mg  1.2 mg Oral Daily Costin Karlyne Greenspan, MD      . diclofenac sodium (VOLTAREN) 1 % transdermal gel 2 g  2 g Topical QID PRN Belkys A Regalado, MD   2 g at 03/01/17 1537  . Difluprednate 0.05 % EMUL 1 drop  1 drop Ophthalmic BID Belkys A Regalado, MD   1 drop at 03/01/17 2151   Followed by  . [START ON 03/09/2017] Difluprednate 0.05 % EMUL 1 drop  1 drop Ophthalmic Daily Belkys A Regalado, MD      . enoxaparin (LOVENOX) injection 40 mg  40 mg Subcutaneous Q24H Reyne Dumas, MD   40 mg at 03/01/17 2149  .  escitalopram (LEXAPRO) tablet 5 mg  5 mg Oral Daily Reyne Dumas, MD   5 mg at 03/01/17 0945  . ibuprofen (ADVIL,MOTRIN) tablet 400 mg  400 mg Oral Q4H PRN Costin Karlyne Greenspan, MD      . levalbuterol Penne Lash) nebulizer solution 0.63 mg  0.63 mg Nebulization Q6H PRN Reyne Dumas, MD      . ondansetron (ZOFRAN) tablet 4 mg  4 mg Oral Q6H PRN Reyne Dumas, MD       Or  . ondansetron (ZOFRAN) injection 4 mg  4 mg Intravenous Q6H PRN Reyne Dumas, MD      . oxyCODONE (Oxy IR/ROXICODONE) immediate release tablet 5 mg  5 mg Oral Q4H PRN Reyne Dumas, MD   5 mg at 03/01/17 2158  . pravastatin (PRAVACHOL) tablet 40 mg  40 mg Oral Daily Reyne Dumas, MD   40 mg at 03/01/17 0944     Discharge Medications: Please see discharge summary for a list of discharge medications.  Relevant Imaging Results:  Relevant Lab Results:   Additional  Information ssn#[phone]  Lia Hopping, LCSW

## 2017-03-02 NOTE — Discharge Summary (Signed)
Physician Discharge Summary  Pamela Choi SNK:[id number] DOB: May 30, 1930 DOA: 02/28/2017  PCP: Lucretia Kern., DO  Admit date: 02/28/2017 Discharge date: 03/02/2017  Admitted From: home Disposition:  SNF  Recommendations for Outpatient Follow-up:  1. Follow up with PCP in 1-2 weeks 2. Discontinue atenolol   Discharge Condition: stable CODE STATUS: Full code Diet recommendation: regular  HPI: Per Dr. Allyson Sabal, 83 year old female with a past medical history of dementia, hypertension, dyslipidemia,R infiltrating ductal carcinoma of breast s/p radiation therapy > 10 years ago, acute pericarditis, anxiety, depression, who lives alone, next to her daughter and her son-in-law, who presents to ED today after a fall. Patient was found by her daughter on the floor at around 6 AM this morning.She has been well enough to perform most of the ADLs by herself and lives independently. When the patient arrived to the ER, she was found to have abrasions on her nose, head, bruising of her left periorbital area. She complained of excruciating left upper extremity pain. Patient is unable to recall the events but patient's daughter states that she probably fell this morning. When the daughter found her she was sitting on the floor, and in a lot of pain. A week ago patient was seen by PCP for rectal bleeding which was attribute it to hemorrhoids. She has not had any recurrent bleeding since. Daughter denies any dysarthria, if easier, focal weakness this morning  Hospital Course: Discharge Diagnoses:  Principal Problem:   Syncope and collapse Active Problems:   Hyperlipemia   Essential hypertension   Memory loss  Fall / syncope -patient was orthostatic on admission, she received IV fluids with improvement in her blood pressure.It is likely that she has poor p.o. intake at baseline and inadequate hydration at home.  Her blood pressure improved and started to be on the high side, and will resume her Norvasc and  hold atenolol on discharge.  Please follow-up on blood pressure and adjust medications as indicated.  Telemetry was unremarkable, she underwent a 2D echo which showed normal EF, her cardiac markers have remained negative. 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  Dr. Tyrell Antonio discussed with neurology on call, and will need outpatient follow-up. Bradycardia-on admission, improved off of atenolol, heart rate in the 70s, continue to hold beta-blocker on discharge. Hyperlipidemia -continue statin Hypertension -resume Norvasc as above Recent cataract surgery Underlying dementia   Discharge Instructions   Allergies as of 03/02/2017      Reactions   Bee Venom Anaphylaxis      Medication List    STOP taking these medications   atenolol 50 MG tablet Commonly known as:  TENORMIN     TAKE these medications   amLODipine 5 MG tablet Commonly known as:  NORVASC TAKE 1 TABLET BY MOUTH DAILY   aspirin EC 81 MG tablet Take 81 mg by mouth daily.   BESIVANCE 0.6 % Susp Generic drug:  Besifloxacin HCl Place 1 drop into the right eye 3 (three) times daily. Use for 2 weeks (from 02/22/17 - 03/08/17)   cetirizine 10 MG tablet Commonly known as:  ZYRTEC Take 1 tablet (10 mg total) by mouth daily.   donepezil 10 MG tablet Commonly known as:  ARICEPT TAKE 1 TABLET(10 MG) BY MOUTH AT BEDTIME   DUREZOL 0.05 % Emul Generic drug:  Difluprednate Place 1 drop into the right  eye See admin instructions. Instill one drop in right eye three times a day for 1 week (end 03/01/17), then Instill one drop in right eye twice daily for 1 week (end 03/08/17, then Instill one drop in the right eye for 1 week, then stop (end 03/15/17)   escitalopram 5 MG tablet Commonly known as:  LEXAPRO Take 1 tablet (5 mg total) by mouth  daily.   oxyCODONE 5 MG immediate release tablet Commonly known as:  Oxy IR/ROXICODONE Take 1 tablet (5 mg total) by mouth every 4 (four) hours as needed for moderate pain.   pravastatin 40 MG tablet Commonly known as:  PRAVACHOL TAKE 1 TABLET BY MOUTH EVERY DAY   PROLENSA 0.07 % Soln Generic drug:  Bromfenac Sodium Place 1 drop into the right eye at bedtime. Instill one drop in the right eye daily for 4 weeks (end 03/23/27)   Vitamin D3 3000 units Tabs Take 1 tablet by mouth daily.      Contact information for after-discharge care    Destination    [facility] .   Specialty:  Skilled Nursing Facility Contact information: [place] [phone]             Allergies  Allergen Reactions  . Bee Venom Anaphylaxis    Consultations:  None   Procedures/Studies:  2D echo  Study Conclusions - Left ventricle: The cavity size was normal. Wall thickness was increased in a pattern of moderate LVH. Systolic function was   normal. The estimated ejection fraction was in the range of 60%  to 65%. Wall motion was normal; there were no regional wall   motion abnormalities. Doppler parameters are consistent with abnormal left ventricular relaxation (grade 1 diastolic dysfunction). The E/e&' ratio is between 8-15, suggesting indeterminate LV filling pressure. - Aortic valve: Sclerosis without stenosis. There was mild, posteriorly directed regurgitation. - Mitral valve: Mildly thickened leaflets . There was trivial regurgitation. - Left atrium: The atrium was normal in size. - Inferior vena cava: The vessel was normal in size. The respirophasic diameter changes were in the normal range (>= 50%), consistent with normal central venous pressure.  Dg Chest 2 View  Result Date: 02/28/2017 CLINICAL DATA:  Status post he fall. Mental status change. History of hypertension and hyperlipidemia and former smoker. Right breast  malignancy. EXAM: CHEST  2 VIEW COMPARISON:  Chest x-ray of July 18, 2015 FINDINGS: The lungs are adequately inflated. There is no focal infiltrate. There is no evidence of a pulmonary contusion, pleural effusion, or pneumothorax. The cardiac silhouette is mildly enlarged. The pulmonary vascularity is not engorged. There is calcification in the wall of the aortic arch. The trachea is midline. The bony thorax exhibits no acute abnormality. IMPRESSION: There is no evidence of acute cardiopulmonary abnormality or acute thoracic injury. Thoracic aortic atherosclerosis. Mild cardiomegaly is likely projectional related to the AP technique Electronically Signed   By: David  Martinique M.D.   On: 02/28/2017 12:38   Dg Forearm Left  Result Date: 02/28/2017 CLINICAL DATA:  Arm pain secondary to a fall last night EXAM: LEFT FOREARM - 2 VIEW COMPARISON:  None. FINDINGS: There is no evidence of fracture or other focal bone lesions. Slight arthritis of the radiocarpal joint. Soft tissues are unremarkable. IMPRESSION: No acute abnormalities. Electronically Signed   By: Lorriane Shire M.D.   On: 02/28/2017 08:57   Ct Head Wo Contrast  Result Date: 02/28/2017 CLINICAL DATA:  Pain after trauma. EXAM: CT  HEAD WITHOUT CONTRAST CT MAXILLOFACIAL WITHOUT CONTRAST CT CERVICAL SPINE WITHOUT CONTRAST TECHNIQUE: Multidetector CT imaging of the head, cervical spine, and maxillofacial structures were performed using the standard protocol without intravenous contrast. Multiplanar CT image reconstructions of the cervical spine and maxillofacial structures were also generated. COMPARISON:  None. FINDINGS: CT HEAD FINDINGS Brain: No subdural, epidural, or subarachnoid hemorrhage. Cerebellum, brainstem, and basal cisterns are within normal limits. No mass, mass effect, or midline shift. Moderate to severe white matter changes are identified. No acute cortical ischemia is identified. Vascular: Calcified atherosclerosis is seen in the intracranial  portions of the carotid arteries. 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 Skull: There is a right nasal bone fracture with mild depression on series 8, image 2, incompletely characterized. No other calvarial fractures identified. Other: There is soft tissue swelling over the forehead. No other soft tissue abnormalities identified. CT MAXILLOFACIAL FINDINGS Osseous: A depressed right nasal bone fracture is identified. No other fractures are identified. Orbits: Negative. No traumatic or inflammatory finding. Sinuses: Clear. Soft tissues: Soft tissue swelling is seen over the right forehead and nose. No other soft tissue abnormalities are identified. CT CERVICAL SPINE FINDINGS Alignment: Normal. Skull base and vertebrae: No acute fracture. No primary bone lesion or focal pathologic process. Soft tissues and spinal canal: No prevertebral fluid or swelling. No visible canal hematoma. Disc levels: Multilevel degenerative changes with small anterior and posterior osteophytes. Upper chest: Negative. Other: No other abnormalities are identified. IMPRESSION: 1. 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 2. No infarct, ischemia, or bleed identified within the brain. 3. Depressed right nasal bone fracture. 4. No fracture or traumatic malalignment in the cervical spine. Electronically Signed   By: Dorise Bullion III M.D   On: 02/28/2017 09:41   Ct Cervical Spine Wo Contrast  Result Date: 02/28/2017 CLINICAL DATA:  Pain after trauma. EXAM: CT HEAD WITHOUT CONTRAST CT MAXILLOFACIAL WITHOUT CONTRAST CT CERVICAL SPINE WITHOUT CONTRAST TECHNIQUE: Multidetector CT imaging of the head, cervical spine, and maxillofacial structures were performed using the standard protocol without intravenous contrast. Multiplanar CT image reconstructions of the cervical spine and maxillofacial structures  were also generated. COMPARISON:  None. FINDINGS: CT HEAD FINDINGS Brain: No subdural, epidural, or subarachnoid hemorrhage. Cerebellum, brainstem, and basal cisterns are within normal limits. No mass, mass effect, or midline shift. Moderate to severe white matter changes are identified. No acute cortical ischemia is identified. Vascular: Calcified atherosclerosis is seen in the intracranial portions of the carotid arteries. 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 Skull: There is a right nasal bone fracture with mild depression on series 8, image 2, incompletely characterized. No other calvarial fractures identified. Other: There is soft tissue swelling over the forehead. No other soft tissue abnormalities identified. CT MAXILLOFACIAL FINDINGS Osseous: A depressed right nasal bone fracture is identified. No other fractures are identified. Orbits: Negative. No traumatic or inflammatory finding. Sinuses: Clear. Soft tissues: Soft tissue swelling is seen over the right forehead and nose. No other soft tissue abnormalities are identified. CT CERVICAL SPINE FINDINGS Alignment: Normal. Skull base and vertebrae: No acute fracture. No primary bone lesion or focal pathologic process. Soft tissues and spinal canal: No prevertebral fluid or swelling. No visible canal hematoma. Disc levels: Multilevel degenerative changes with small anterior and posterior osteophytes. Upper chest: Negative.  Other: No other abnormalities are identified. IMPRESSION: 1. 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 2. No infarct, ischemia, or bleed identified within the brain. 3. Depressed right nasal bone fracture. 4. No fracture or traumatic malalignment in the cervical spine. Electronically Signed   By: Dorise Bullion III M.D   On: 02/28/2017 09:41   Mr Jodene Nam Head Wo Contrast  Result Date:  02/28/2017 CLINICAL DATA:  Fall.  Pain after trauma. EXAM: MRI HEAD WITHOUT CONTRAST MRA HEAD WITHOUT CONTRAST TECHNIQUE: Multiplanar, multiecho pulse sequences of the brain and surrounding structures were obtained without intravenous contrast. Angiographic images of the head were obtained using MRA technique without contrast. COMPARISON:  CT head without contrast from today. MRI of the brain 06/02/2013. FINDINGS: MRI HEAD FINDINGS Brain: The diffusion-weighted images demonstrate no acute or subacute infarction. Sagittal T1 weighted images demonstrate no acute hemorrhage. Enlarged empty sella is again seen. Mild atrophy is stable. No acute hemorrhage or mass lesion is present. Vascular: Flow is present in major intracranial arteries. Aneurysmal dilation of the proximal right M1 segment is stable. Skull and upper cervical spine: Craniocervical junction is normal. Marrow signal is normal. The upper cervical spine is unremarkable. Midline sagittal structures are otherwise unremarkable. Sinuses/Orbits: The paranasal sinuses and mastoid air cells are clear. The globes and orbits are within normal limits. MRA HEAD FINDINGS Time-of-flight images demonstrate fusiform aneurysmal dilation of right ICA terminus, measuring up to 8 mm, similar to the prior exam The left ICA terminus is normal. Left A1 and M1 segments are normal. There is some attenuation of distal A1 M1 segments on the right. ACA and MCA branch vessels are attenuated bilaterally. The right vertebral artery is the dominant vessel. PICA origins are not well visualized. The basilar artery is small. Fetal type posterior cerebral arteries are present bilaterally. A small P1 segment contributes on the left. There is attenuation of distal PCA branch vessels bilaterally. IMPRESSION: 1. Aneurysmal dilation of the terminal right ICA is similar to the prior exams. 2. Mild diffuse distal small vessel disease. 3. No acute intracranial abnormality.  No evidence for acute  trauma. Electronically Signed   By: San Morelle M.D.   On: 02/28/2017 16:24   Mr Brain Wo Contrast  Result Date: 02/28/2017 CLINICAL DATA:  Fall.  Pain after trauma. EXAM: MRI HEAD WITHOUT CONTRAST MRA HEAD WITHOUT CONTRAST TECHNIQUE: Multiplanar, multiecho pulse sequences of the brain and surrounding structures were obtained without intravenous contrast. Angiographic images of the head were obtained using MRA technique without contrast. COMPARISON:  CT head without contrast from today. MRI of the brain 06/02/2013. FINDINGS: MRI HEAD FINDINGS Brain: The diffusion-weighted images demonstrate no acute or subacute infarction. Sagittal T1 weighted images demonstrate no acute hemorrhage. Enlarged empty sella is again seen. Mild atrophy is stable. No acute hemorrhage or mass lesion is present. Vascular: Flow is present in major intracranial arteries. Aneurysmal dilation of the proximal right M1 segment is stable. Skull and upper cervical spine: Craniocervical junction is normal. Marrow signal is normal. The upper cervical spine is unremarkable. Midline sagittal structures are otherwise unremarkable. Sinuses/Orbits: The paranasal sinuses and mastoid air cells are clear. The globes and orbits are within normal limits. MRA HEAD FINDINGS Time-of-flight images demonstrate fusiform aneurysmal dilation of right ICA terminus, measuring up to 8 mm, similar to the prior exam The left ICA terminus is normal. Left A1 and M1 segments are normal. There is some attenuation of  distal A1 M1 segments on the right. ACA and MCA branch vessels are attenuated bilaterally. The right vertebral artery is the dominant vessel. PICA origins are not well visualized. The basilar artery is small. Fetal type posterior cerebral arteries are present bilaterally. A small P1 segment contributes on the left. There is attenuation of distal PCA branch vessels bilaterally. IMPRESSION: 1. Aneurysmal dilation of the terminal right ICA is similar to  the prior exams. 2. Mild diffuse distal small vessel disease. 3. No acute intracranial abnormality.  No evidence for acute trauma. Electronically Signed   By: San Morelle M.D.   On: 02/28/2017 16:24   Dg Humerus Left  Result Date: 02/28/2017 CLINICAL DATA:  Unwitnessed fall last night.  Left arm pain. EXAM: LEFT HUMERUS - 2+ VIEW COMPARISON:  None. FINDINGS: There is no evidence of fracture or other focal bone lesions. Slight arthritis of the glenohumeral joint. Soft tissues are unremarkable. IMPRESSION: No acute abnormality. Electronically Signed   By: Lorriane Shire M.D.   On: 02/28/2017 08:56   Dg Hand Complete Left  Result Date: 02/28/2017 CLINICAL DATA:  Left hand pain secondary to a fall last night. EXAM: LEFT HAND - COMPLETE 3+ VIEW COMPARISON:  None. FINDINGS: There is no fracture or dislocation. Slight arthritic changes in the carpal bones and of the radiocarpal joint. Congenital fusion of the lunate and triquetrum. IMPRESSION: No acute abnormalities. Electronically Signed   By: Lorriane Shire M.D.   On: 02/28/2017 08:58   Dg Hand Complete Right  Result Date: 02/28/2017 CLINICAL DATA:  Right hand pain secondary to a fall last night. EXAM: RIGHT HAND - COMPLETE 3+ VIEW COMPARISON:  None. FINDINGS: There is no fracture or dislocation or other acute abnormality. 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 Slight arthritis in the wrist joints as well as at the first and second MCP joints and in the IP joints of the fingers. IMPRESSION: No acute abnormality.  Arthritic changes as described. Electronically Signed   By: Lorriane Shire M.D.   On: 02/28/2017 08:59   Ct Maxillofacial Wo Contrast  Result Date: 02/28/2017 CLINICAL DATA:  Pain after trauma. EXAM: CT HEAD WITHOUT CONTRAST CT MAXILLOFACIAL WITHOUT CONTRAST CT CERVICAL SPINE WITHOUT CONTRAST TECHNIQUE: Multidetector CT imaging of the head, cervical spine, and maxillofacial structures were performed using the standard protocol  without intravenous contrast. Multiplanar CT image reconstructions of the cervical spine and maxillofacial structures were also generated. COMPARISON:  None. FINDINGS: CT HEAD FINDINGS Brain: No subdural, epidural, or subarachnoid hemorrhage. Cerebellum, brainstem, and basal cisterns are within normal limits. No mass, mass effect, or midline shift. Moderate to severe white matter changes are identified. No acute cortical ischemia is identified. Vascular: Calcified atherosclerosis is seen in the intracranial portions of the carotid arteries. 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 Skull: There is a right nasal bone fracture with mild depression on series 8, image 2, incompletely characterized. No other calvarial fractures identified. Other: There is soft tissue swelling over the forehead. No other soft tissue abnormalities identified. CT MAXILLOFACIAL FINDINGS Osseous: A depressed right nasal bone fracture is identified. No other fractures are identified. Orbits: Negative. No traumatic or inflammatory finding. Sinuses: Clear. Soft tissues: Soft tissue swelling is seen over the right forehead and nose. No other soft tissue abnormalities are identified. CT CERVICAL SPINE FINDINGS Alignment: Normal. Skull base and vertebrae: No acute fracture. No primary bone lesion or focal pathologic process. Soft tissues and spinal canal: No  prevertebral fluid or swelling. No visible canal hematoma. Disc levels: Multilevel degenerative changes with small anterior and posterior osteophytes. Upper chest: Negative. Other: No other abnormalities are identified. IMPRESSION: 1. 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 2. No infarct, ischemia, or bleed identified within the brain. 3. Depressed right nasal bone fracture. 4. No fracture or traumatic malalignment in the cervical spine.  Electronically Signed   By: Dorise Bullion III M.D   On: 02/28/2017 09:41    Subjective: - no chest pain, shortness of breath, no abdominal pain, nausea or vomiting.   Discharge Exam: Vitals:   03/01/17 2137 03/02/17 0658  BP: (!) 162/73 (!) 154/83  Pulse: 63 72  Resp: 18 20  Temp: 99.1 F (37.3 C) 98.8 F (37.1 C)   Vitals:   02/28/17 2047 03/01/17 1441 03/01/17 2137 03/02/17 0658  BP: (!) 142/62 (!) 142/72 (!) 162/73 (!) 154/83  Pulse: 69 67 63 72  Resp: 18 20 18 20   Temp: 99.8 F (37.7 C) 98.9 F (37.2 C) 99.1 F (37.3 C) 98.8 F (37.1 C)  TempSrc: Oral Oral Oral Oral  SpO2: 94% 93% 96% 93%    General: Pt is alert, awake, not in acute distress Cardiovascular: RRR, S1/S2 +, no rubs, no gallops Respiratory: CTA bilaterally, no wheezing, no rhonchi Abdominal: Soft, NT, ND, bowel sounds + Extremities: no edema, no cyanosis    The results of significant diagnostics from this hospitalization (including imaging, microbiology, ancillary and laboratory) are listed below for reference.     Microbiology: No results found for this or any previous visit (from the past 240 hour(s)).   Labs: BNP (last 3 results) No results for input(s): BNP in the last 8760 hours. Basic Metabolic Panel:  Recent Labs Lab 02/28/17 0918 02/28/17 1315 03/01/17 0716  NA 142  --  139  K 3.3*  --  3.3*  CL 110  --  110  CO2 24  --  24  GLUCOSE 93  --  98  BUN 25*  --  15  CREATININE 1.03*  --  0.80  CALCIUM 8.9  --  8.3*  MG  --  1.9  --    Liver Function Tests:  Recent Labs Lab 03/01/17 0716  AST 27  ALT 15  ALKPHOS 85  BILITOT 1.0  PROT 6.0*  ALBUMIN 3.3*   No results for input(s): LIPASE, AMYLASE in the last 168 hours.  Recent Labs Lab 03/01/17 1740  AMMONIA 27   CBC:  Recent Labs Lab 02/28/17 0918 03/01/17 0716  WBC 12.5* 7.2  NEUTROABS 9.9*  --   HGB 13.0 12.1  HCT 40.0 37.8  MCV 98.8 98.2  PLT 176 165   Cardiac Enzymes:  Recent Labs Lab  02/28/17 0918 02/28/17 1315 02/28/17 1837 03/01/17 0103  CKTOTAL 160  --   --   --   TROPONINI  --  <0.03 <0.03 <0.03   BNP: Invalid input(s): POCBNP CBG: No results for input(s): GLUCAP in the last 168 hours. D-Dimer No results for input(s): DDIMER in the last 72 hours. Hgb A1c No results for input(s): HGBA1C in the last 72 hours. Lipid Profile No results for input(s): CHOL, HDL, LDLCALC, TRIG, CHOLHDL, LDLDIRECT in the last 72 hours. Thyroid function studies  Recent Labs  02/28/17 2038  TSH 2.303   Anemia work up  Recent Labs  03/01/17 1612  VITAMINB12 234   Urinalysis  Component Value Date/Time   COLORURINE YELLOW 02/28/2017 1022   APPEARANCEUR CLEAR 02/28/2017 1022   LABSPEC 1.016 02/28/2017 1022   PHURINE 6.0 02/28/2017 1022   GLUCOSEU NEGATIVE 02/28/2017 1022   HGBUR NEGATIVE 02/28/2017 1022   BILIRUBINUR NEGATIVE 02/28/2017 [place] 02/28/2017 1022   PROTEINUR NEGATIVE 02/28/2017 1022   NITRITE NEGATIVE 02/28/2017 1022   LEUKOCYTESUR NEGATIVE 02/28/2017 1022   Sepsis Labs Invalid input(s): PROCALCITONIN,  WBC,  LACTICIDVEN Microbiology No results found for this or any previous visit (from the past 240 hour(s)).   Time coordinating discharge: 40 minutes  SIGNED:  Marzetta Board, MD  [facility] 03/02/2017, 11:48 AM Pager [phone]  If 7PM-7AM, please contact night-coverage [email or web address] Password [email or web address]

## 2017-03-02 NOTE — Care Management Obs Status (Signed)
[facility] NOTIFICATION   Patient Details  Name: Pamela Choi MRN: [id number] Date of Birth: 1930/10/20   Medicare Observation Status Notification Given:  Yes    Dellie Catholic, RN 03/02/2017, 9:39 AM

## 2017-03-04 ENCOUNTER — Encounter (HOSPITAL_COMMUNITY): Payer: Self-pay | Admitting: Emergency Medicine

## 2017-03-04 ENCOUNTER — Emergency Department (HOSPITAL_COMMUNITY): Payer: Medicare Other

## 2017-03-04 ENCOUNTER — Emergency Department (HOSPITAL_COMMUNITY)
Admission: EM | Admit: 2017-03-04 | Discharge: 2017-03-04 | Disposition: A | Discharge status: Home / Self Care | Payer: Medicare Other | Attending: Emergency Medicine | Admitting: Emergency Medicine

## 2017-03-04 DIAGNOSIS — F0390 Unspecified dementia without behavioral disturbance: Secondary | ICD-10-CM | POA: Diagnosis not present

## 2017-03-04 DIAGNOSIS — I951 Orthostatic hypotension: Secondary | ICD-10-CM

## 2017-03-04 DIAGNOSIS — Z7982 Long term (current) use of aspirin: Secondary | ICD-10-CM | POA: Insufficient documentation

## 2017-03-04 DIAGNOSIS — R55 Syncope and collapse: Secondary | ICD-10-CM | POA: Insufficient documentation

## 2017-03-04 DIAGNOSIS — Z87891 Personal history of nicotine dependence: Secondary | ICD-10-CM | POA: Insufficient documentation

## 2017-03-04 DIAGNOSIS — Z79899 Other long term (current) drug therapy: Secondary | ICD-10-CM | POA: Insufficient documentation

## 2017-03-04 DIAGNOSIS — I10 Essential (primary) hypertension: Secondary | ICD-10-CM | POA: Diagnosis not present

## 2017-03-04 LAB — CBC
HEMATOCRIT: 41.4 % (ref 36.0–46.0)
HEMOGLOBIN: 13.6 g/dL (ref 12.0–15.0)
MCH: 32.5 pg (ref 26.0–34.0)
MCHC: 32.9 g/dL (ref 30.0–36.0)
MCV: 99 fL (ref 78.0–100.0)
Platelets: 171 10*3/uL (ref 150–400)
RBC: 4.18 MIL/uL (ref 3.87–5.11)
RDW: 13.3 % (ref 11.5–15.5)
WBC: 7.2 10*3/uL (ref 4.0–10.5)

## 2017-03-04 LAB — BASIC METABOLIC PANEL
ANION GAP: 7 (ref 5–15)
BUN: 22 mg/dL — ABNORMAL HIGH (ref 6–20)
CALCIUM: 8.8 mg/dL — AB (ref 8.9–10.3)
CHLORIDE: 107 mmol/L (ref 101–111)
CO2: 26 mmol/L (ref 22–32)
CREATININE: 1.07 mg/dL — AB (ref 0.44–1.00)
GFR calc non Af Amer: 46 mL/min — ABNORMAL LOW (ref >60)
GFR, EST AFRICAN AMERICAN: 54 mL/min — AB (ref >60)
Glucose, Bld: 135 mg/dL — ABNORMAL HIGH (ref 65–99)
Potassium: 3.7 mmol/L (ref 3.5–5.1)
SODIUM: 140 mmol/L (ref 135–145)

## 2017-03-04 LAB — URINALYSIS, ROUTINE W REFLEX MICROSCOPIC
BILIRUBIN URINE: NEGATIVE
Glucose, UA: NEGATIVE mg/dL
Hgb urine dipstick: NEGATIVE
KETONES UR: NEGATIVE mg/dL
LEUKOCYTES UA: NEGATIVE
NITRITE: NEGATIVE
PH: 5 (ref 5.0–8.0)
Protein, ur: NEGATIVE mg/dL
SPECIFIC GRAVITY, URINE: 1.028 (ref 1.005–1.030)

## 2017-03-04 LAB — I-STAT TROPONIN, ED: TROPONIN I, POC: 0.07 ng/mL (ref 0.00–0.08)

## 2017-03-04 MED ORDER — SODIUM CHLORIDE 0.9 % IV BOLUS (SEPSIS)
500.0000 mL | Freq: Once | INTRAVENOUS | Status: AC
  Administered 2017-03-04: 500 mL via INTRAVENOUS

## 2017-03-04 MED ORDER — SODIUM CHLORIDE 0.9 % IV BOLUS (SEPSIS): 500.0000 mL | Freq: Once | INTRAVENOUS | Status: DC

## 2017-03-04 NOTE — ED Triage Notes (Signed)
Pt in from [facility] via [facility] with syncope r/t possible orthostatic hypotension. Per EMS, pt was sitting in chair at facility, became unresponsive and staff couldn't feel radial pulse. Staff laid her supine, and she became alert. Hx of recent falls, was seen on 3/7 at [facility] for similar. Bruises to face and hands r/t recent falls. EMS gave 144mcg Fentanyl en route. Questionable nasal fx, pt on 2L [place], a&ox4. BP 124/76, cbg 150

## 2017-03-04 NOTE — Discharge Instructions (Signed)
It was our pleasure to provide your ER care today - we hope that you feel better.  It is very important that you stay well hydrated - drink adequate fluids.   Consider supplementing nutrition with ensure shakes or boost.   Given recent fainting/low blood pressure episodes, stop taking your amlodipine (norvasc) - discuss with primary care doctor whether they want to re-start your medication.  Follow up with primary care doctor in the next couple of days.   Return to ER if worse, new symptoms, fevers, trouble breathing, change in mental status, other concern.

## 2017-03-04 NOTE — ED Notes (Signed)
Pt. Family wanting update from EDP. EDP made aware.

## 2017-03-04 NOTE — ED Notes (Signed)
Patient transported to X-ray 

## 2017-03-04 NOTE — ED Notes (Signed)
Pt. Returned from CT at this time

## 2017-03-04 NOTE — ED Notes (Signed)
Admitting provider at bedside. Will in-and-out cath after assessment.

## 2017-03-04 NOTE — ED Provider Notes (Addendum)
[facility] DEPT Provider Note   CSN: [id number] Arrival date & time: 03/04/17  1318     History   Chief Complaint Chief Complaint  Patient presents with  . Loss of Consciousness    HPI Pamela Choi is a 83 y.o. female.  Patient with hx dementia, htn, recent admission for syncope, presents from ECF.  Patient indicates she doesn't know why she is here, states she feels fine.  Patient had fall prior to most recent admission, pt unsure if any falls since.   Patient denies headache. No chest pain or sob. No abd pain. Denies vomiting or diarrhea. Denies dysuria or gu c/o. Denies fever or chills. Pt limited historian - hx dementia - level 5 caveat.   Family subsequently arrives, they indicate yesterday AM pt found on floor at ecf, and today that pt while sitting, became unresponsive, and had low bp.  Family indicates pt has been eating and drinking. They are unaware of any new change in meds. They indicate pts current mental status appears c/w baseline, however they indicate prior to recent admission pt was able to attend to adls, and ambulate on own.     The history is provided by the patient and the EMS personnel. The history is limited by the condition of the patient.  Loss of Consciousness   Pertinent negatives include abdominal pain, back pain, chest pain, confusion, fever, headaches and weakness.    Past Medical History:  Diagnosis Date  . Acute pericarditis 07/20/2015  . Allergy   . ANKLE SPRAIN, RIGHT 05/10/2007   Qualifier: Diagnosis of  By: Norma Fredrickson MD, Larena Glassman    . Anxiety and depression   . 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 Chicken pox   . Hyperlipidemia   . Hypertension   . Mild dementia    eval with neurology in 2014, mod SVD, declined tx  . Osteopenia   . Pericardial effusion    a. 06/2015 Echo: EF 55%, mild LVH, mild AI, mild dil of Asc Ao (10m, 38m@ root), nl RV fxn, mild TR, small to mod circumferential pericardial effusion.  . Pericarditis    a. 06/2015  . Plantar wart of right foot   . TOBACCO ABUSE, HX OF 11/08/2006   Qualifier: Diagnosis of  By: SiLinden DolinD, AaMarjory Lies    Patient Active Problem List   Diagnosis Date Noted  . Syncope and collapse 02/28/2017  . Recurrent major depressive disorder, in full remission ([place]/03/2017  . Memory loss 02/22/2013  . Hyperlipemia 01/11/2007  . Essential hypertension 11/08/2006    Past Surgical History:  Procedure Laterality Date  . BREAST SURGERY    . BUNIONECTOMY     R foot  . HAMMER TOE SURGERY     Right foot    OB History    No data available       Home Medications    Prior to Admission medications   Medication Sig Start Date End Date Taking? Authorizing Provider  amLODipine (NORVASC) 5 MG tablet TAKE 1 TABLET BY MOUTH DAILY 11/23/16   HaLucretia KernDO  aspirin EC 81 MG tablet Take 81 mg by mouth daily.    Historical Provider, MD  BESIVANCE 0.6 % SUSP Place 1 drop into the right eye 3 (three) times daily. Use for 2 weeks (from 02/22/17 -  03/08/17) 02/22/17   Historical Provider, MD  cetirizine (ZYRTEC) 10 MG tablet Take 1 tablet (10 mg total) by mouth daily. 08/14/12   Elyse Jarvis, MD  Cholecalciferol (VITAMIN D3) 3000 units TABS Take 1 tablet by mouth daily.    Historical Provider, MD  donepezil (ARICEPT) 10 MG tablet TAKE 1 TABLET(10 MG) BY MOUTH AT BEDTIME 10/27/16   York Spaniel, MD  DUREZOL 0.05 % EMUL Place 1 drop into the right eye See admin instructions. Instill one drop in right eye three times a day for 1 week (end 03/01/17), then Instill one drop in right eye twice daily for 1 week (end 03/08/17, then Instill one drop in the right eye for 1 week, then stop (end 03/15/17) 02/22/17   Historical Provider, MD  escitalopram (LEXAPRO) 5 MG tablet Take 1 tablet (5 mg total) by mouth daily. 12/30/16    Terressa Koyanagi, DO  oxyCODONE (OXY IR/ROXICODONE) 5 MG immediate release tablet Take 1 tablet (5 mg total) by mouth every 4 (four) hours as needed for moderate pain. 03/02/17   Costin Otelia Sergeant, MD  pravastatin (PRAVACHOL) 40 MG tablet TAKE 1 TABLET BY MOUTH EVERY DAY 01/31/17   Terressa Koyanagi, DO  PROLENSA 0.07 % SOLN Place 1 drop into the right eye at bedtime. Instill one drop in the right eye daily for 4 weeks (end 03/23/27) 02/26/17   Historical Provider, MD    Family History Family History  Problem Relation Age of Onset  . Diabetes Mother   . Hypertension Mother   . Cancer Brother   . Heart attack Brother   . Stroke Brother   . Stroke Brother     Social History Social History  Substance Use Topics  . Smoking status: Former Smoker    Quit date: 08/02/2003  . Smokeless tobacco: Never Used  . Alcohol use No     Allergies   Bee venom   Review of Systems Review of Systems  Constitutional: Negative for chills and fever.  HENT: Negative for sore throat.   Eyes: Negative for redness.  Respiratory: Negative for shortness of breath.   Cardiovascular: Positive for syncope. Negative for chest pain.  Gastrointestinal: Negative for abdominal pain.  Genitourinary: Negative for flank pain.  Musculoskeletal: Negative for back pain and neck pain.  Skin: Negative for rash.  Neurological: Negative for weakness, numbness and headaches.  Hematological: Does not bruise/bleed easily.  Psychiatric/Behavioral: Negative for confusion.     Physical Exam Updated Vital Signs BP 114/80 (BP Location: Right Arm)   Pulse 82   Resp 17   Ht 5\' 5"  (1.651 m)   Wt 72.1 kg   SpO2 99%   BMI 26.46 kg/m   Physical Exam  Constitutional: She appears well-developed and well-nourished. No distress.  HENT:  Mouth/Throat: Oropharynx is clear and moist.  Old bruising noted about bil periorbital region  Eyes: Conjunctivae are normal. Pupils are equal, round, and reactive to light. No scleral icterus.  Neck:  Neck supple. No tracheal deviation present. No thyromegaly present.  No stiffness or rigidity  Cardiovascular: Normal rate, regular rhythm, normal heart sounds and intact distal pulses.  Exam reveals no gallop and no friction rub.   No murmur heard. Pulmonary/Chest: Effort normal and breath sounds normal. No respiratory distress. She has no wheezes.  Abdominal: Soft. Normal appearance and bowel sounds are normal. She exhibits no distension. There is no tenderness.  No puls mass  Genitourinary:  Genitourinary Comments: No cva tenderness  Musculoskeletal: She exhibits  no edema.  CTLS spine, non tender, aligned, no step off. No focal bony tenderness noted.   Neurological: She is alert.  Speech clear/fluent. Motor intact bil, stre 5/5. sens grossly intact.   Skin: Skin is warm and dry. No rash noted. She is not diaphoretic.  Psychiatric: She has a normal mood and affect.  Nursing note and vitals reviewed.    ED Treatments / Results  Labs (all labs ordered are listed, but only abnormal results are displayed) Results for orders placed or performed during the hospital encounter of 03/04/17  CBC  Result Value Ref Range   WBC 7.2 4.0 - 10.5 K/uL   RBC 4.18 3.87 - 5.11 MIL/uL   Hemoglobin 13.6 12.0 - 15.0 g/dL   HCT 41.4 36.0 - 46.0 %   MCV 99.0 78.0 - 100.0 fL   MCH 32.5 26.0 - 34.0 pg   MCHC 32.9 30.0 - 36.0 g/dL   RDW 13.3 11.5 - 15.5 %   Platelets 171 150 - 400 K/uL  Basic metabolic panel  Result Value Ref Range   Sodium 140 135 - 145 mmol/L   Potassium 3.7 3.5 - 5.1 mmol/L   Chloride 107 101 - 111 mmol/L   CO2 26 22 - 32 mmol/L   Glucose, Bld 135 (H) 65 - 99 mg/dL   BUN 22 (H) 6 - 20 mg/dL   Creatinine, Ser 1.07 (H) 0.44 - 1.00 mg/dL   Calcium 8.8 (L) 8.9 - 10.3 mg/dL   GFR calc non Af Amer 46 (L) >60 mL/min   GFR calc Af Amer 54 (L) >60 mL/min   Anion gap 7 5 - 15  I-stat troponin, ED  Result Value Ref Range   Troponin i, poc 0.07 0.00 - 0.08 ng/mL   Comment 3             Dg Chest 2 View  Result Date: 03/04/2017 CLINICAL DATA:  Syncope and fall per EMS. Pt reports she does not remember falling or fainting. Pt has bruising on face and reports pain and stiffness in right hand. Hx HTN, former smoker. EXAM: CHEST  2 VIEW COMPARISON:  02/28/2017 FINDINGS: The heart is enlarged. Aorta is mildly tortuous. There are no focal consolidations or pleural effusions. No pulmonary edema. No pneumothorax or acute displaced fracture. IMPRESSION: Mild, stable cardiomegaly. Electronically Signed   By: Nolon Nations M.D.   On: 03/04/2017 15:29    Ct Head Wo Contrast  Result Date: 03/04/2017 CLINICAL DATA:  Fall .  Pain.  Possible syncope. EXAM: CT HEAD WITHOUT CONTRAST TECHNIQUE: Contiguous axial images were obtained from the base of the skull through the vertex without intravenous contrast. COMPARISON:  02/28/2017 head CT. FINDINGS: Brain: No evidence of parenchymal hemorrhage or extra-axial fluid collection. No mass lesion, mass effect, or midline shift. No CT evidence of acute infarction. Generalized cerebral volume loss. Nonspecific prominent subcortical and periventricular white matter hypodensity, most in keeping with chronic small vessel ischemic change. Stable ventricles with no evidence of hydrocephalus. Vascular: Stable coarsely peripherally calcified right ICA terminus 2.0 x 0.8 cm aneurysm. No hyperdense vessel or new unexpected calcification. Skull: No evidence of calvarial fracture. Stable chronic right nasal bone fracture. Sinuses/Orbits: Stable mild mucoperiosteal thickening in the ethmoidal air cells bilaterally. No fluid levels in the visualized paranasal sinuses. Stable small osteoma in the anterior right ethmoidal air cells Other:  The mastoid air cells are unopacified. IMPRESSION: 1. No evidence of acute intracranial abnormality. No evidence of calvarial fracture. 2. 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 3. Generalized cerebral volume loss and  prominent chronic small vessel ischemia. 4. Mild chronic paranasal sinusitis. Electronically Signed   By: Ilona Sorrel M.D.   On: 03/04/2017 15:47       EKG  EKG Interpretation  Date/Time:  Friday March 04 2017 13:31:58 EST Ventricular Rate:  85 PR Interval:    QRS Duration: 93 QT Interval:  406 QTC Calculation: 483 R Axis:   -60 Text Interpretation:  Ectopic atrial rhythm Ventricular premature complex Nonspecific T wave abnormality No significant change since last tracing Confirmed by Ashok Cordia  MD, Lennette Bihari ([id number]) on 03/04/2017 1:36:03 PM       Radiology No results found.  Procedures Procedures (including critical care time)  Medications Ordered in ED Medications - No data to display   Initial Impression / Assessment and Plan / ED Course  I have reviewed the triage vital signs and the nursing notes.  Pertinent labs & imaging results that were available during my care of the patient were reviewed by me and considered in my medical decision making (see chart for details).  Labs and imaging studies ordered.   Reviewed nursing notes and prior charts for additional history.   Recheck pt, content, alert appearing.   Given earlier syncope today, as well as report found on ground at ecf yesterday AM will admit to medicine service.   In looking at patients labs, pt had mild bump up in bun/cr compared to baseline last visit, improved to baseline during hospital stay, and now again bun/cr w small bump, similar to at time of recent admit.  ?given dementia whether poor po intake, mild dehydration as cause orthostasis/syncope.  Also ?whether may not need bp meds currently.   ivf in ED.  Po fluids. No new c/o.   Given recurrent syncope, Hospitalists/Dr Mikhail consulted for admission - she will evaluate in ED.  Dr Ree Kida as evaluated, and spoken with family - she indicates family agreeable with d/c to ecf, that no further inpatient evaluation is planned.  ivf still running, and UA  pending.  Signed out to Dr Tamera Punt to check UA when back, if positive tx appropriately.  If UA negative, and patient continues to feel improved, d/c back to ECF with close pcp f/u.     Final Clinical Impressions(s) / ED Diagnoses   Final diagnoses:  None    New Prescriptions New Prescriptions   No medications on file             Lajean Saver, MD 03/04/17 1636

## 2017-03-04 NOTE — ED Notes (Signed)
EDP at bedside  

## 2017-03-04 NOTE — Consult Note (Signed)
Medical Consultation   Pamela Choi  ZOX:[id number]  DOB: 05-Jul-1930  DOA: 03/04/2017  PCP: Lucretia Kern., DO  Outpatient specialists: Dr. Jannifer Franklin, neurologist  Requesting physician: Dr. Lajean Saver, EDP  Reason for consultation: Syncope, ?admission  History of Present Illness: 83 year old with history of hypertension, dementia, recent admission and discharge 03/01/2017 for syncope. Patient presented from skilled nursing facility for syncopal episode. It seems that she had fallen yesterday and was found unresponsive today. Patient is on a chair or unaware of any of the prior events. Currently no complaints of chest pain, shortness of breath, abdominal pain, nausea or vomiting, diarrhea constipation, from just urination, headache or dizziness. Patient does complain of hand pain. Patient's family is at bedside and states the patient was able to take care of herself prior to fall and recent admission earlier this week. Denies knowing of any new medications or changes. States patient has been eating well in their presence.  Review of Systems:  All other systems reviewed and are negative.    Past Medical History: Past Medical History:  Diagnosis Date  . Acute pericarditis 07/20/2015  . Allergy   . ANKLE SPRAIN, RIGHT 05/10/2007   Qualifier: Diagnosis of  By: Norma Fredrickson MD, Larena Glassman    . Anxiety and depression   . 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 Chicken pox   . Hyperlipidemia   . Hypertension   . Mild dementia    eval with neurology in 2014, mod SVD, declined tx  . Osteopenia   . Pericardial effusion    a. 06/2015 Echo: EF 55%, mild LVH, mild AI, mild dil of Asc Ao (35m, 36m@ root), nl RV fxn, mild TR, small to mod circumferential pericardial effusion.  . Pericarditis    a. 06/2015  . Plantar wart  of right foot   . TOBACCO ABUSE, HX OF 11/08/2006   Qualifier: Diagnosis of  By: SiLinden DolinD, AaMarjory Lies    Past Surgical History: Past Surgical History:  Procedure Laterality Date  . BREAST SURGERY    . BUNIONECTOMY     R foot  . HAMMER TOE SURGERY     Right foot    Allergies:   Allergies  Allergen Reactions  . Bee Venom Anaphylaxis    Social History:  reports that she quit smoking about 13 years ago. She has never used smokeless tobacco. She reports that she does not drink alcohol or use drugs.  Family History: Family History  Problem Relation Age of Onset  . Diabetes Mother   . Hypertension Mother   . Cancer Brother   . Heart attack Brother   . Stroke Brother   . Stroke Brother     Physical Exam: Blood pressure 121/77, pulse 106, resp. rate 21, height '5\' 5"'$  (1.651 m), weight 72.1 kg (159 lb), SpO2 94 %.   General: Well developed, well nourished, NAD, appears stated age  HE22NC, periorbital bruising, EOMI, Anicteic Sclera, mucous membranes moist.   Neck: Supple, no JVD  Cardiovascular: S1 S2 auscultated, no rubs, murmurs or gallops. Regular rate and rhythm.  Respiratory: Clear to auscultation bilaterally with equal chest rise  Abdomen: Soft, nontender, nondistended, + bowel sounds  Extremities: warm dry without cyanosis clubbing or edema  Neuro: AAOx2, cranial nerves grossly intact. Strength equal and bilateral in the upper and  lower extremities.  Skin: Without rashes exudates or nodules  Psych: Normal affect and demeanor with intact judgement and insight  Data reviewed:  I have personally reviewed following labs and imaging studies Labs:  CBC:  Recent Labs Lab 02/28/17 0918 03/01/17 0716 03/04/17 1339  WBC 12.5* 7.2 7.2  NEUTROABS 9.9*  --   --   HGB 13.0 12.1 13.6  HCT 40.0 37.8 41.4  MCV 98.8 98.2 99.0  PLT 176 165 948    Basic Metabolic Panel:  Recent Labs Lab 02/28/17 0918 02/28/17 1315 03/01/17 0716 03/04/17 1339  NA 142   --  139 140  K 3.3*  --  3.3* 3.7  CL 110  --  110 107  CO2 24  --  24 26  GLUCOSE 93  --  98 135*  BUN 25*  --  15 22*  CREATININE 1.03*  --  0.80 1.07*  CALCIUM 8.9  --  8.3* 8.8*  MG  --  1.9  --   --    GFR Estimated Creatinine Clearance: 38.9 mL/min (by C-G formula based on SCr of 1.07 mg/dL (H)). Liver Function Tests:  Recent Labs Lab 03/01/17 0716  AST 27  ALT 15  ALKPHOS 85  BILITOT 1.0  PROT 6.0*  ALBUMIN 3.3*   No results for input(s): LIPASE, AMYLASE in the last 168 hours.  Recent Labs Lab 03/01/17 1740  AMMONIA 27   Coagulation profile No results for input(s): INR, PROTIME in the last 168 hours.  Cardiac Enzymes:  Recent Labs Lab 02/28/17 0918 02/28/17 1315 02/28/17 1837 03/01/17 0103  CKTOTAL 160  --   --   --   TROPONINI  --  <0.03 <0.03 <0.03   BNP: Invalid input(s): POCBNP CBG: No results for input(s): GLUCAP in the last 168 hours. D-Dimer No results for input(s): DDIMER in the last 72 hours. Hgb A1c No results for input(s): HGBA1C in the last 72 hours. Lipid Profile No results for input(s): CHOL, HDL, LDLCALC, TRIG, CHOLHDL, LDLDIRECT in the last 72 hours. Thyroid function studies No results for input(s): TSH, T4TOTAL, T3FREE, THYROIDAB in the last 72 hours.  Invalid input(s): FREET3 Anemia work up No results for input(s): VITAMINB12, FOLATE, FERRITIN, TIBC, IRON, RETICCTPCT in the last 72 hours. Urinalysis    Component Value Date/Time   COLORURINE YELLOW 02/28/2017 1022   APPEARANCEUR CLEAR 02/28/2017 1022   LABSPEC 1.016 02/28/2017 1022   PHURINE 6.0 02/28/2017 1022   GLUCOSEU NEGATIVE 02/28/2017 [place] 02/28/2017 1022   BILIRUBINUR NEGATIVE 02/28/2017 [place] 02/28/2017 1022   PROTEINUR NEGATIVE 02/28/2017 1022   NITRITE NEGATIVE 02/28/2017 1022   LEUKOCYTESUR NEGATIVE 02/28/2017 1022    Sepsis Labs Invalid input(s): PROCALCITONIN,  WBC,  LACTICIDVEN Microbiology No results found for  this or any previous visit (from the past 240 hour(s)).  Inpatient Medications:   Scheduled Meds: Continuous Infusions: . sodium chloride      Radiological Exams on Admission: Dg Chest 2 View  Result Date: 03/04/2017 CLINICAL DATA:  Syncope and fall per EMS. Pt reports she does not remember falling or fainting. Pt has bruising on face and reports pain and stiffness in right hand. Hx HTN, former smoker. EXAM: CHEST  2 VIEW COMPARISON:  02/28/2017 FINDINGS: The heart is enlarged. Aorta is mildly tortuous. There are no focal consolidations or pleural effusions. No pulmonary edema. No pneumothorax or acute displaced fracture. IMPRESSION: Mild, stable cardiomegaly. Electronically Signed   By: Nolon Nations M.D.  On: 03/04/2017 15:29   Ct Head Wo Contrast  Result Date: 03/04/2017 CLINICAL DATA:  Fall .  Pain.  Possible syncope. EXAM: CT HEAD WITHOUT CONTRAST TECHNIQUE: Contiguous axial images were obtained from the base of the skull through the vertex without intravenous contrast. COMPARISON:  02/28/2017 head CT. FINDINGS: Brain: No evidence of parenchymal hemorrhage or extra-axial fluid collection. No mass lesion, mass effect, or midline shift. No CT evidence of acute infarction. Generalized cerebral volume loss. Nonspecific prominent subcortical and periventricular white matter hypodensity, most in keeping with chronic small vessel ischemic change. Stable ventricles with no evidence of hydrocephalus. Vascular: Stable coarsely peripherally calcified right ICA terminus 2.0 x 0.8 cm aneurysm. No hyperdense vessel or new unexpected calcification. Skull: No evidence of calvarial fracture. Stable chronic right nasal bone fracture. Sinuses/Orbits: Stable mild mucoperiosteal thickening in the ethmoidal air cells bilaterally. No fluid levels in the visualized paranasal sinuses. Stable small osteoma in the anterior right ethmoidal air cells Other:  The mastoid air cells are unopacified. IMPRESSION: 1. No evidence  of acute intracranial abnormality. No evidence of calvarial fracture. 2. 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 3. Generalized cerebral volume loss and prominent chronic small vessel ischemia. 4. Mild chronic paranasal sinusitis. Electronically Signed   By: Ilona Sorrel M.D.   On: 03/04/2017 15:47    Impression/Recommendations  Syncope -Patient recently admitted and discharged on 03/02/2017 for syncope and fall. At that time patient was noted to have possible dehydration as she was noted to be orthostatic on admission with mild renal insufficiency. Patient's blood pressure as well as renal function did improve with IV fluids. Echocardiogram was conducted that time showing a normal EF. Her cardiac markers were negative.  -CT head no evidence of acute cranial abnormality -Currently, patient's creatinine is 1.07, mildly elevated from her baseline of 0.8. She may not have good oral intake or adequate hydration. Would suggest more oral intake. Possibly an outpatient carotid Doppler. Its peak to family regarding follow-up with patient's outpatient neurologist, Dr. Jannifer Franklin. Also suggested patient may benefit from compression stockings. -During my examination, did monitor patient's blood pressure laying, sitting, standing, patient was not orthostatic at the time of my examination. -Early patient denies any dizziness or headache. Denies any cough or issues with urination, for infection unlikely. -Spoke with the emergency room physician, recommended IV fluids with return to skilled nursing facility. -Patient also getting oxycodone for pain, not sure if this was given prior to patient's episode.  Mild renal insufficiency -Creatinine 1.07, baseline approximate 0.8 -Continue IV fluids  Dementia -Advised patient's family to follow up with Dr. Jannifer Franklin, outpatient neurology  -Continue to nausea pill  Hypertension -Patient uses Norvasc at the nursing facility, may consider holding that if  patient's blood pressure continues to drop.  Depression -Continue Lexapro  Time Spent: 50 minutes  Damek Ende D.O. [facility] 03/04/2017, 4:35 PM

## 2017-03-06 ENCOUNTER — Other Ambulatory Visit (HOSPITAL_COMMUNITY): Payer: Self-pay | Admitting: Internal Medicine

## 2017-03-06 DIAGNOSIS — R0902 Hypoxemia: Secondary | ICD-10-CM

## 2017-03-07 ENCOUNTER — Ambulatory Visit (HOSPITAL_COMMUNITY)
Admission: RE | Admit: 2017-03-07 | Discharge: 2017-03-07 | Disposition: A | Discharge status: Home / Self Care | Payer: No Typology Code available for payment source | Source: Ambulatory Visit | Attending: Internal Medicine | Admitting: Internal Medicine

## 2017-03-07 DIAGNOSIS — R918 Other nonspecific abnormal finding of lung field: Secondary | ICD-10-CM | POA: Insufficient documentation

## 2017-03-07 DIAGNOSIS — J9811 Atelectasis: Secondary | ICD-10-CM | POA: Diagnosis not present

## 2017-03-07 DIAGNOSIS — I2699 Other pulmonary embolism without acute cor pulmonale: Secondary | ICD-10-CM | POA: Diagnosis not present

## 2017-03-07 DIAGNOSIS — I2510 Atherosclerotic heart disease of native coronary artery without angina pectoris: Secondary | ICD-10-CM | POA: Insufficient documentation

## 2017-03-07 DIAGNOSIS — R0902 Hypoxemia: Secondary | ICD-10-CM | POA: Diagnosis present

## 2017-03-07 MED ORDER — IOPAMIDOL (ISOVUE-370) INJECTION 76%
INTRAVENOUS | Status: AC
  Filled 2017-03-07: qty 100

## 2017-03-07 MED ORDER — IOPAMIDOL (ISOVUE-370) INJECTION 76%
100.0000 mL | Freq: Once | INTRAVENOUS | Status: AC | PRN
  Administered 2017-03-07: 64 mL via INTRAVENOUS

## 2017-05-18 ENCOUNTER — Ambulatory Visit (INDEPENDENT_AMBULATORY_CARE_PROVIDER_SITE_OTHER): Payer: Medicare Other | Admitting: Podiatry

## 2017-05-18 ENCOUNTER — Encounter: Payer: Self-pay | Admitting: Podiatry

## 2017-05-18 DIAGNOSIS — B351 Tinea unguium: Secondary | ICD-10-CM

## 2017-05-18 DIAGNOSIS — M79674 Pain in right toe(s): Secondary | ICD-10-CM | POA: Diagnosis not present

## 2017-05-18 DIAGNOSIS — M79675 Pain in left toe(s): Secondary | ICD-10-CM | POA: Diagnosis not present

## 2017-05-18 DIAGNOSIS — Q828 Other specified congenital malformations of skin: Secondary | ICD-10-CM | POA: Diagnosis not present

## 2017-05-18 NOTE — Patient Instructions (Signed)
Subjective: This patient presents again today complaining of uncomfortable toenails when walking wearing shoes and requests toenail debridement. Also, patient complaining of a painful skin lesion on the plantar aspect of the right foot History of falls since the visit of 02/16/2017. Patient currently living in assisted living facility Patient's daughter-in-law present and treatment room  Objective: Patient is confused and has difficulty responding to questioning Patient is very reactive to instrumentation when debrided nails and her daughter-in-law help to stabilize patient's foot and ankle during the skin a nail debridement. The patient's daughter-in-law applied mild restraint during the debridement DP and PT pulses 2/4 bilaterally Capillary reflex immediate bilaterally Sensation to 10 g monofilament wire intact 5/5 bilaterally Vibratory sensation intact bilaterally Ankle reflex equal and reactive bilaterally The toenails are thickened, elongated, brittle, discolored and tender to direct palpation 6-10 Nucleated plantar lesion third MPJ right Surgical scar dorsal right first MPJ Hallux limitus right  Assessment: Symptomatic onychomycoses 6-10 Porokeratosis 1  Plan: Toenails 6-10 are debrided mechanically and electrically found any bleeding Debride porokeratosis 1 without any bleeding  Reappoint 3 months

## 2017-05-18 NOTE — Progress Notes (Signed)
Patient ID: Pamela Choi, female   DOB: 02-Oct-1930, 83 y.o.   MRN: [id number]    Subjective: This patient presents again today complaining of uncomfortable toenails when walking wearing shoes and requests toenail debridement. Also, patient complaining of a painful skin lesion on the plantar aspect of the right foot History of falls since the visit of 02/16/2017. Patient currently living in assisted living facility Patient's daughter-in-law present and treatment room  Objective: Patient is confused and has difficulty responding to questioning Patient is very reactive to instrumentation when debrided nails and her daughter-in-law help to stabilize patient's foot and ankle during the skin a nail debridement. The patient's daughter-in-law applied mild restraint during the debridement DP and PT pulses 2/4 bilaterally Capillary reflex immediate bilaterally Sensation to 10 g monofilament wire intact 5/5 bilaterally Vibratory sensation intact bilaterally Ankle reflex equal and reactive bilaterally The toenails are thickened, elongated, brittle, discolored and tender to direct palpation 6-10 Nucleated plantar lesion third MPJ right Surgical scar dorsal right first MPJ Hallux limitus right  Assessment: Symptomatic onychomycoses 6-10 Porokeratosis 1  Plan: Toenails 6-10 are debrided mechanically and electrically found any bleeding Debride porokeratosis 1 without any bleeding  Reappoint 3 months

## 2017-06-02 ENCOUNTER — Ambulatory Visit: Payer: Medicare Other | Admitting: Family Medicine

## 2017-06-06 ENCOUNTER — Encounter (INDEPENDENT_AMBULATORY_CARE_PROVIDER_SITE_OTHER): Payer: Self-pay

## 2017-08-17 ENCOUNTER — Encounter: Payer: Self-pay | Admitting: Podiatry

## 2017-08-17 ENCOUNTER — Ambulatory Visit: Payer: Medicare Other | Admitting: Podiatry

## 2017-08-17 ENCOUNTER — Ambulatory Visit (INDEPENDENT_AMBULATORY_CARE_PROVIDER_SITE_OTHER): Payer: Medicare Other | Admitting: Podiatry

## 2017-08-17 DIAGNOSIS — Q828 Other specified congenital malformations of skin: Secondary | ICD-10-CM | POA: Diagnosis not present

## 2017-08-17 DIAGNOSIS — M79674 Pain in right toe(s): Secondary | ICD-10-CM | POA: Diagnosis not present

## 2017-08-17 DIAGNOSIS — B351 Tinea unguium: Secondary | ICD-10-CM

## 2017-08-17 DIAGNOSIS — M79675 Pain in left toe(s): Secondary | ICD-10-CM

## 2017-08-17 NOTE — Progress Notes (Signed)
Patient ID: Pamela Choi, female   DOB: 1930-04-18, 83 y.o.   MRN: [id number]    Subjective: This patient presents again today complaining of uncomfortable toenails when walking wearing shoes and requests toenail debridement. Also, patient complaining of a painful skin lesion on the plantar aspect of the right foot History of falls since the visit of 02/16/2017. Patient currently living in assisted living facility Patient's daughter-in-law present and treatment room  Objective: Patient transfers from wheelchair to treatment chair Patient is confused and has difficulty responding to questioning Patient is very reactive to instrumentation when debrided nails and her daughter-in-law help to stabilize patient's foot and ankle during the skin a nail debridement.The patient's daughter-in-law applied mild restraint during the debridement DP and PT pulses 2/4 bilaterally Capillary reflex immediate bilaterally Sensation to 10 g monofilament wire intact 5/5 bilaterally Vibratory sensation intact bilaterally Ankle reflex equal and reactive bilaterally The toenails are thickened, elongated, brittle, discolored and tender to direct palpation 6-10 Nucleated plantar lesion third MPJ right Surgical scar dorsal right first MPJ Hallux limitus right  Assessment: Symptomatic onychomycoses 6-10 Porokeratosis 1  Plan: Toenails 6-10 are debrided mechanically and electrically found any bleeding Debride porokeratosis 1 without any bleeding  Reappoint 3 months

## 2017-08-17 NOTE — Patient Instructions (Signed)
ubjective: This patient presents again today complaining of uncomfortable toenails when walking wearing shoes and requests toenail debridement. Also, patient complaining of a painful skin lesion on the plantar aspect of the right foot History of falls since the visit of 02/16/2017. Patient currently living in assisted living facility Patient's daughter-in-law present and treatment room  Objective: Patient transfers from wheelchair to treatment chair Patient is confused and has difficulty responding to questioning Patient is very reactive to instrumentation when debrided nails and her daughter-in-law help to stabilize patient's foot and ankle during the skin a nail debridement.The patient's daughter-in-law applied mild restraint during the debridement DP and PT pulses 2/4 bilaterally Capillary reflex immediate bilaterally Sensation to 10 g monofilament wire intact 5/5 bilaterally Vibratory sensation intact bilaterally Ankle reflex equal and reactive bilaterally The toenails are thickened, elongated, brittle, discolored and tender to direct palpation 6-10 Nucleated plantar lesion third MPJ right Surgical scar dorsal right first MPJ Hallux limitus right  Assessment: Symptomatic onychomycoses 6-10 Porokeratosis 1  Plan: Toenails 6-10 are debrided mechanically and electrically found any bleeding Debride porokeratosis 1 without any bleeding  Reappoint 3 months

## 2017-11-01 ENCOUNTER — Ambulatory Visit: Payer: Medicare Other | Admitting: Adult Health

## 2017-11-14 ENCOUNTER — Ambulatory Visit: Payer: Medicare Other | Admitting: Podiatry

## 2018-02-13 ENCOUNTER — Emergency Department (HOSPITAL_COMMUNITY): Payer: Medicare Other

## 2018-02-13 ENCOUNTER — Emergency Department (HOSPITAL_COMMUNITY)
Admission: EM | Admit: 2018-02-13 | Discharge: 2018-02-13 | Disposition: A | Discharge status: Skilled Nursing Facility | Payer: Medicare Other | Attending: Emergency Medicine | Admitting: Emergency Medicine

## 2018-02-13 ENCOUNTER — Other Ambulatory Visit: Payer: Self-pay

## 2018-02-13 ENCOUNTER — Encounter (HOSPITAL_COMMUNITY): Payer: Self-pay

## 2018-02-13 DIAGNOSIS — S0003XA Contusion of scalp, initial encounter: Secondary | ICD-10-CM

## 2018-02-13 DIAGNOSIS — S0990XA Unspecified injury of head, initial encounter: Secondary | ICD-10-CM | POA: Diagnosis present

## 2018-02-13 DIAGNOSIS — W19XXXA Unspecified fall, initial encounter: Secondary | ICD-10-CM

## 2018-02-13 DIAGNOSIS — Y999 Unspecified external cause status: Secondary | ICD-10-CM | POA: Diagnosis not present

## 2018-02-13 DIAGNOSIS — Y929 Unspecified place or not applicable: Secondary | ICD-10-CM | POA: Diagnosis not present

## 2018-02-13 DIAGNOSIS — Z7901 Long term (current) use of anticoagulants: Secondary | ICD-10-CM | POA: Diagnosis not present

## 2018-02-13 DIAGNOSIS — Z79899 Other long term (current) drug therapy: Secondary | ICD-10-CM | POA: Diagnosis not present

## 2018-02-13 DIAGNOSIS — F0390 Unspecified dementia without behavioral disturbance: Secondary | ICD-10-CM | POA: Insufficient documentation

## 2018-02-13 DIAGNOSIS — I10 Essential (primary) hypertension: Secondary | ICD-10-CM | POA: Diagnosis not present

## 2018-02-13 DIAGNOSIS — Z7982 Long term (current) use of aspirin: Secondary | ICD-10-CM | POA: Diagnosis not present

## 2018-02-13 DIAGNOSIS — W050XXA Fall from non-moving wheelchair, initial encounter: Secondary | ICD-10-CM | POA: Diagnosis not present

## 2018-02-13 DIAGNOSIS — Y939 Activity, unspecified: Secondary | ICD-10-CM | POA: Insufficient documentation

## 2018-02-13 NOTE — ED Triage Notes (Addendum)
Patient arrives from [facility] after a witnessed fall-getting up from wheelchair-with family and staff-patient fell back onto buttocks and hit left parietal area on floor-hematoma noted behind left ear area-no LOC-no blood thinners. Patient has dementia and is denying any pain. BP 136/92 HR 74 RR20 CBG 130

## 2018-02-13 NOTE — ED Notes (Signed)
Patient transported to CT 

## 2018-02-13 NOTE — ED Provider Notes (Signed)
[facility] DEPT Provider Note   CSN: [id number] Arrival date & time: 02/13/18  1939     History   Chief Complaint Chief Complaint  Patient presents with  . Fall  . Hematoma head    HPI Pamela Choi is a 84 y.o. female with a history of dementia, currently living at [facility] brought into the emergency department today for witnessed fall while patient was attempting to get up from a wheelchair.  Patient was reported to be getting up from her wheelchair when she fell back and hit her left occipital area.  No loss of consciousness.  There is noted to be a hematoma of this area.  No reported blood thinner use but patient's chart does show patient is on warfarin.  Unable to gather definitive history of this as patient has baseline dementia.  Patient is not complaining of any pain.  Nothing given prior to arrival.    HPI  Past Medical History:  Diagnosis Date  . Acute pericarditis 07/20/2015  . Allergy   . ANKLE SPRAIN, RIGHT 05/10/2007   Qualifier: Diagnosis of  By: Norma Fredrickson MD, Larena Glassman    . Anxiety and depression   . 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 Chicken pox   . Hyperlipidemia   . Hypertension   . Mild dementia    eval with neurology in 2014, mod SVD, declined tx  . Osteopenia   . Pericardial effusion    a. 06/2015 Echo: EF 55%, mild LVH, mild AI, mild dil of Asc Ao (29m, 355m@ root), nl RV fxn, mild TR, small to mod circumferential pericardial effusion.  . Pericarditis    a. 06/2015  . Plantar wart of right foot   . TOBACCO ABUSE, HX OF 11/08/2006   Qualifier: Diagnosis of  By: SiLinden DolinD, AaMarjory Lies    Patient Active Problem List   Diagnosis Date Noted  . Syncope and collapse 02/28/2017  . Recurrent major depressive disorder, [place]  . Memory loss 02/22/2013  . Hyperlipemia 01/11/2007  . Essential hypertension 11/08/2006    Past Surgical History:  Procedure Laterality Date  . BREAST SURGERY    . BUNIONECTOMY     R foot  . HAMMER TOE SURGERY     Right foot    OB History    No data available       Home Medications    Prior to Admission medications   Medication Sig Start Date End Date Taking? Authorizing Provider  aspirin EC 81 MG tablet Take 81 mg by mouth daily.    [provider]  BESIVANCE 0.6 % SUSP Place 1 drop into the right eye See admin instructions. Instill one drop into right eye 3 times daily  for 2 weeks (from 02/22/17 - 03/08/17) 02/22/17   [provider]  cetirizine (ZYRTEC) 10 MG tablet Take 1 tablet (10 mg total) by mouth daily. 08/14/12   SaPedro EarlsMD  cholecalciferol (VITAMIN D) 1000 units tablet Take 3,000 Units by mouth daily.    [provider]  diclofenac sodium (VOLTAREN) 1 % GEL Apply topically 4 (four) times daily.    [provider]  donepezil (ARICEPT) 10 MG tablet TAKE 1 TABLET(10 MG) BY MOUTH AT BEDTIME Patient taking differently: Take 10 mg by mouth at  bedtime.  10/27/16   Kathrynn Ducking, MD  DUREZOL 0.05 % EMUL Place 1 drop into the right eye See admin instructions. Instill one drop into right eye three times a day for 1 week (end 03/01/17), then Instill one drop into right eye twice daily for 1 week (end 03/08/17, then Instill one drop into the right eye for 1 week, then stop (end 03/15/17) 02/22/17   [provider]  escitalopram (LEXAPRO) 5 MG tablet Take 1 tablet (5 mg total) by mouth daily. 12/30/16   Lucretia Kern, DO  LORazepam (ATIVAN) 1 MG tablet Take 1 mg by mouth at bedtime.    [provider]  oxyCODONE (OXY IR/ROXICODONE) 5 MG immediate release tablet Take 1 tablet (5 mg total) by mouth every 4 (four) hours as needed for moderate pain. 03/02/17   Caren Griffins, MD  pravastatin  (PRAVACHOL) 40 MG tablet TAKE 1 TABLET BY MOUTH EVERY DAY Patient taking differently: TAKE 1 TABLET BY MOUTH DAILY AT BEDTIME 01/31/17   Colin Benton R, DO  PROLENSA 0.07 % SOLN Place 1 drop into the right eye See admin instructions. Instill one drop into the right eye daily at bedtime for 4 weeks (end 03/23/27) 02/26/17   [provider]  warfarin (COUMADIN) 2 MG tablet Take 2 mg by mouth daily.    [provider]    Family History Family History  Problem Relation Age of Onset  . Diabetes Mother   . Hypertension Mother   . Cancer Brother   . Heart attack Brother   . Stroke Brother   . Stroke Brother     Social History Social History   Tobacco Use  . Smoking status: Former Smoker    Last attempt to quit: 08/02/2003    Years since quitting: 14.5  . Smokeless tobacco: Never Used  Substance Use Topics  . Alcohol use: No  . Drug use: No     Allergies   Bee venom   Review of Systems Review of Systems  Unable to perform ROS: Dementia  Level 5 Caveat    Physical Exam Updated Vital Signs BP (!) 162/84 (BP Location: Right Arm)   Pulse 61   Temp 98.4 F (36.9 C) (Oral)   Resp 17   SpO2 98%   Physical Exam  Constitutional: She appears well-developed and well-nourished.  Elderly female, frail appearing lying in stretcher in no acute distress.  HENT:  Head: Normocephalic.  Right Ear: Tympanic membrane and external ear normal.  Left Ear: Tympanic membrane and external ear normal.  Nose: Nose normal.  Mouth/Throat: Uvula is midline, oropharynx is clear and moist and mucous membranes are normal. No tonsillar exudate.  There is a 2 cm x 2 cm area of hematoma on the left occipital area of the patient's scalp.  No open or depressed skull fractures palpated.  No CSF otorrhea.  No hemotympanum.  No raccoon eyes or battle signs.  Eyes: Pupils are equal, round, and reactive to light. Right eye exhibits no discharge. Left eye exhibits no discharge. No scleral icterus.    Neck: Trachea normal. Neck supple. No spinous process tenderness present. No neck rigidity. Normal range of motion present.  Patient with towel around her neck on presentation.  No C-spine tenderness or step-offs noted.  Cardiovascular: Normal rate, regular rhythm and intact distal pulses.  No murmur heard. Pulses:      Radial pulses are 2+ on the right side, and 2+ on the left side.  Dorsalis pedis pulses are 2+ on the right side, and 2+ on the left side.       Posterior tibial pulses are 2+ on the right side, and 2+ on the left side.  No lower extremity swelling or edema. Calves symmetric in size bilaterally.  Pulmonary/Chest: Effort normal and breath sounds normal. She exhibits no tenderness.  Abdominal: Soft. Bowel sounds are normal. There is no tenderness. There is no rebound and no guarding.  Musculoskeletal: She exhibits no edema.  Lymphadenopathy:    She has no cervical adenopathy.  Neurological: She is alert.  Patient oriented to self.  Not place or time.  Speech clear. Follows commands. No facial droop. PERRL. EOM grossly intact. CN III-XII grossly intact. Grossly moves all extremities 4 without ataxia. Able and appropriate strength for age to upper and lower extremities.   Skin: Skin is warm and dry. No rash noted. She is not diaphoretic.  Psychiatric: She has a normal mood and affect.  Nursing note and vitals reviewed.    ED Treatments / Results  Labs (all labs ordered are listed, but only abnormal results are displayed) Labs Reviewed - No data to display  EKG  EKG Interpretation None       Radiology Ct Head Wo Contrast  Result Date: 02/13/2018 CLINICAL DATA:  Witnessed fall getting up from wheelchair. Patient fell onto buttocks hitting left parietal area on the floor. Hematoma behind the left ear. EXAM: CT HEAD WITHOUT CONTRAST CT CERVICAL SPINE WITHOUT CONTRAST TECHNIQUE: Multidetector CT imaging of the head and cervical spine was performed following the  standard protocol without intravenous contrast. Multiplanar CT image reconstructions of the cervical spine were also generated. COMPARISON:  03/04/2017 FINDINGS: CT HEAD FINDINGS Brain: Chronic calcified right ICA terminus aneurysm measuring approximately 1.9 x 0.8 cm, stable in appearance. Moderate chronic small vessel ischemia with atrophy. No acute intracranial abnormality. No hydrocephalus. Midline fourth ventricle. No effacement of the basal cisterns. Vascular: Ectatic atherosclerotic carotid siphons. No hyperdense vessels. Skull: Negative for acute skull fracture or focal osseous lesions. Sinuses/Orbits: Clear paranasal sinuses and mastoids. Small osteoma in the right anterior ethmoid sinus. Other: Scalp contusion left posterior parietal/occipital region. CT CERVICAL SPINE FINDINGS Alignment: Maintained cervical lordosis. Mild dextroconvex curvature of the cervical spine may be positional. Intact craniocervical relationship and atlantodental interval. Skull base and vertebrae: Osteoarthritis of the atlantodental interval. No acute skull fracture. No jumped or perched facets. Intact skull base. Soft tissues and spinal canal: No prevertebral fluid or swelling. No visible canal hematoma. Disc levels: Moderate to marked disc space narrowing from C3 through C7 with small posterior marginal osteophytes. No jumped or perched facets. Upper chest: Clear Other: None IMPRESSION: 1. Left posterior scalp contusion overlying the posterior parietal skull. No underlying fracture. 2. No acute intracranial abnormality. 3. Stable calcified right ICA terminus aneurysm measuring approximately 1.9 x 0.8 cm. 4. Cervical spondylosis without acute cervical spine fracture or posttraumatic listhesis. Electronically Signed   By: Ashley Royalty M.D.   On: 02/13/2018 21:50   Ct Cervical Spine Wo Contrast  Result Date: 02/13/2018 CLINICAL DATA:  Witnessed fall getting up from wheelchair. Patient fell onto buttocks hitting left parietal  area on the floor. Hematoma behind the left ear. EXAM: CT HEAD WITHOUT CONTRAST CT CERVICAL SPINE WITHOUT CONTRAST TECHNIQUE: Multidetector CT imaging of the head and cervical spine was performed following the standard protocol without intravenous contrast. Multiplanar CT image reconstructions of the cervical spine were also generated. COMPARISON:  03/04/2017 FINDINGS: CT HEAD FINDINGS Brain: Chronic  calcified right ICA terminus aneurysm measuring approximately 1.9 x 0.8 cm, stable in appearance. Moderate chronic small vessel ischemia with atrophy. No acute intracranial abnormality. No hydrocephalus. Midline fourth ventricle. No effacement of the basal cisterns. Vascular: Ectatic atherosclerotic carotid siphons. No hyperdense vessels. Skull: Negative for acute skull fracture or focal osseous lesions. Sinuses/Orbits: Clear paranasal sinuses and mastoids. Small osteoma in the right anterior ethmoid sinus. Other: Scalp contusion left posterior parietal/occipital region. CT CERVICAL SPINE FINDINGS Alignment: Maintained cervical lordosis. Mild dextroconvex curvature of the cervical spine may be positional. Intact craniocervical relationship and atlantodental interval. Skull base and vertebrae: Osteoarthritis of the atlantodental interval. No acute skull fracture. No jumped or perched facets. Intact skull base. Soft tissues and spinal canal: No prevertebral fluid or swelling. No visible canal hematoma. Disc levels: Moderate to marked disc space narrowing from C3 through C7 with small posterior marginal osteophytes. No jumped or perched facets. Upper chest: Clear Other: None IMPRESSION: 1. Left posterior scalp contusion overlying the posterior parietal skull. No underlying fracture. 2. No acute intracranial abnormality. 3. Stable calcified right ICA terminus aneurysm measuring approximately 1.9 x 0.8 cm. 4. Cervical spondylosis without acute cervical spine fracture or posttraumatic listhesis. Electronically Signed   By:  Ashley Royalty M.D.   On: 02/13/2018 21:50    Procedures Procedures (including critical care time)  Medications Ordered in ED Medications - No data to display   Initial Impression / Assessment and Plan / ED Course  I have reviewed the triage vital signs and the nursing notes.  Pertinent labs & imaging results that were available during my care of the patient were reviewed by me and considered in my medical decision making (see chart for details).     84 y.o. female with witnessed mechanical fall today.  Unsure of blood thinner use due to previous documentation not matching with report from EMS.  No loss of consciousness noted.  Patient neurologic exam reassuring.  Will obtain CT head and C-spine to evaluate.  CT without evidence of skull fracture.  No acute intracranial abnormality.  Stable findings from prior.  No cervical spine fracture. I advised the patient to follow-up with PCP this week. Return precautions discussed. The patient appears safe for discharge back to facility.  Patient case discussed with Dr. Wilson Singer who is in agreement with plan.  Final Clinical Impressions(s) / ED Diagnoses   Final diagnoses:  Fall, initial encounter  Hematoma of scalp, initial encounter    ED Discharge Orders    None       Lorelle Gibbs 02/13/18 2310    Virgel Manifold, MD 02/13/18 [phone]

## 2018-02-13 NOTE — ED Notes (Signed)
Bed: [facility] Expected date:  Expected time:  Means of arrival:  Comments: 

## 2018-02-13 NOTE — Discharge Instructions (Signed)
Your CT scan was reassuring. Please follow up with PCP. If you develop worsening or new concerning symptoms you can return to the emergency department for re-evaluation.

## 2018-02-13 NOTE — ED Notes (Signed)
Family at bedside/patient for CT

## 2018-02-13 NOTE — ED Notes (Signed)
Returned from CT.

## 2018-02-13 NOTE — ED Notes (Signed)
[facility] here for transport of patient back to facility.

## 2018-02-13 NOTE — ED Notes (Signed)
ED Provider at bedside. 

## 2018-05-04 ENCOUNTER — Other Ambulatory Visit (HOSPITAL_COMMUNITY): Payer: Self-pay | Admitting: Internal Medicine

## 2018-05-04 DIAGNOSIS — R4182 Altered mental status, unspecified: Secondary | ICD-10-CM

## 2018-05-05 ENCOUNTER — Ambulatory Visit (HOSPITAL_COMMUNITY): Payer: No Typology Code available for payment source

## 2018-05-05 ENCOUNTER — Ambulatory Visit (HOSPITAL_COMMUNITY)
Admission: RE | Admit: 2018-05-05 | Discharge: 2018-05-05 | Disposition: A | Discharge status: Home / Self Care | Payer: Medicare Other | Source: Ambulatory Visit | Attending: Internal Medicine | Admitting: Internal Medicine

## 2018-05-05 DIAGNOSIS — R4182 Altered mental status, unspecified: Secondary | ICD-10-CM | POA: Diagnosis not present

## 2018-05-05 DIAGNOSIS — I671 Cerebral aneurysm, nonruptured: Secondary | ICD-10-CM | POA: Insufficient documentation

## 2018-08-17 IMAGING — CT CT ANGIO CHEST
2 of 6 series · 18 of 36 positions shown · IV contrast (OMNI 350)
Comparison: Chest CT July 19, 2015 and chest radiograph March 04, 2017

CLINICAL DATA: Altered mental status and syncope

EXAM:
CT ANGIOGRAPHY CHEST WITH CONTRAST
TECHNIQUE: Multidetector CT imaging of the chest was performed using the
standard protocol during bolus administration of intravenous
contrast. Multiplanar CT image reconstructions and MIPs were
obtained to evaluate the vascular anatomy.
CONTRAST:  64 mL Isovue 370 nonionic

[Series 7: pe thins · axial · 0.69mm/px · z∈[+1110,+1376]mm · 17 of 301 slices shown]
[im 17/301  lung]
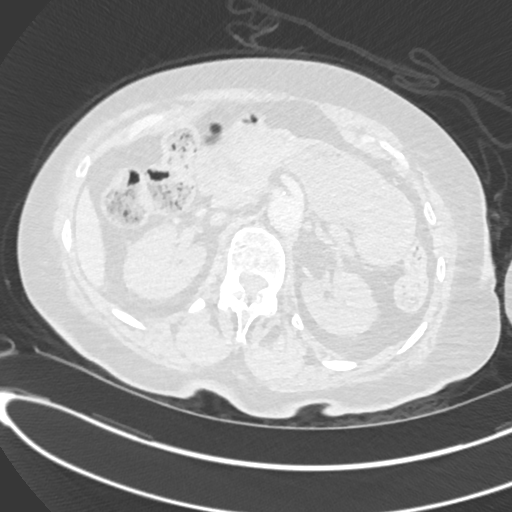
[im 34/301  mediastinal]
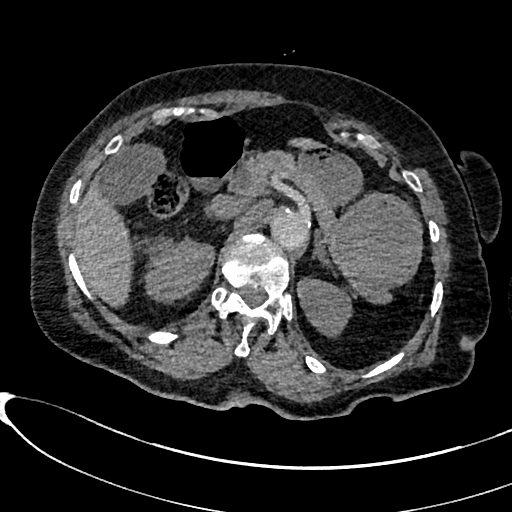
[im 51/301  lung]
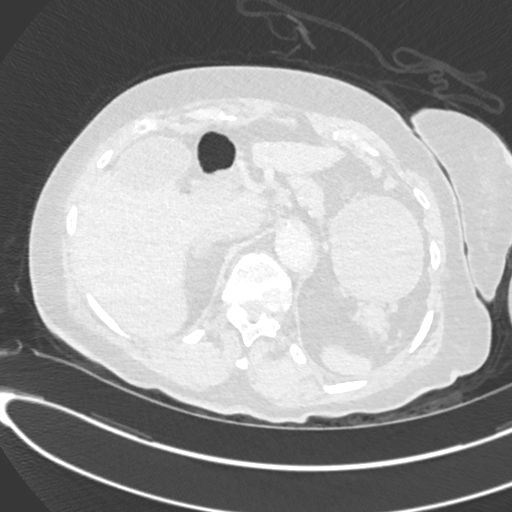
[im 67/301  mediastinal]
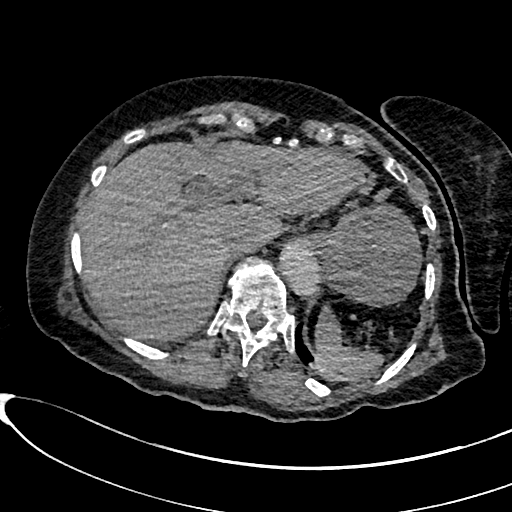
[im 84/301  lung]
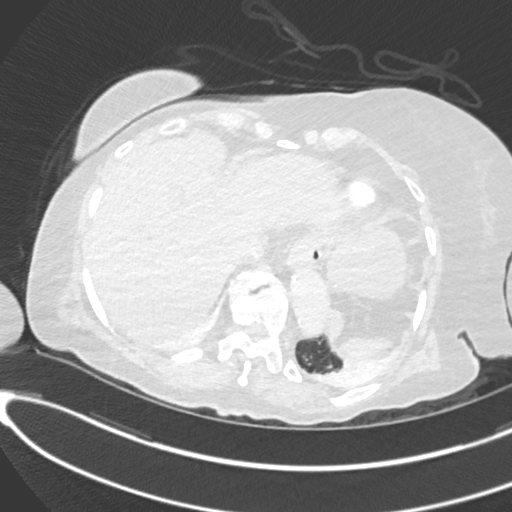
[im 101/301  mediastinal]
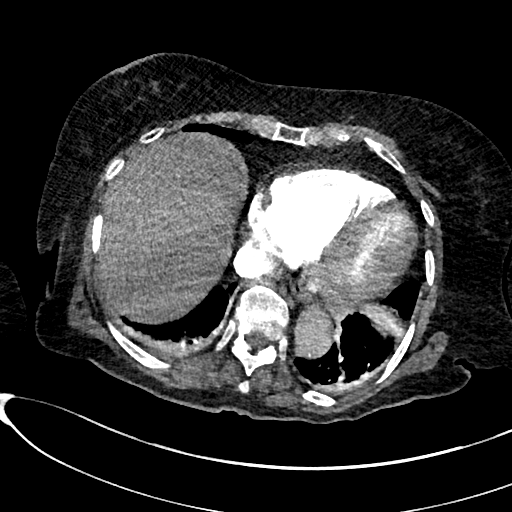
[im 117/301  lung]
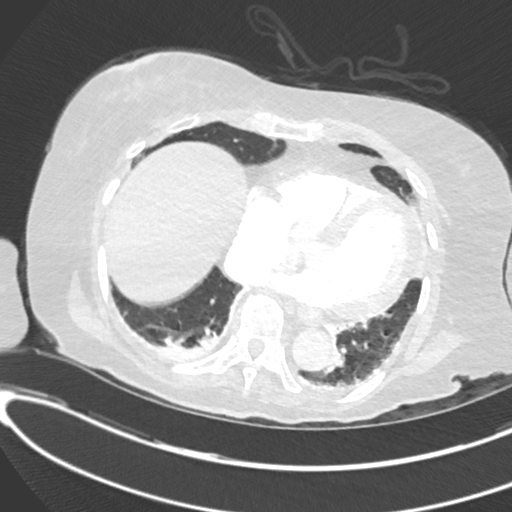
[im 134/301  mediastinal]
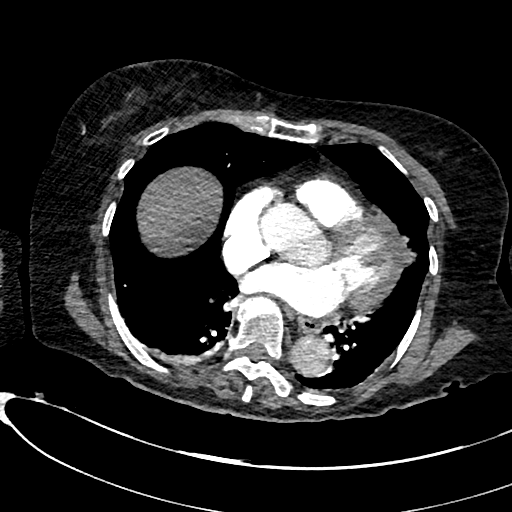
[im 151/301  lung]
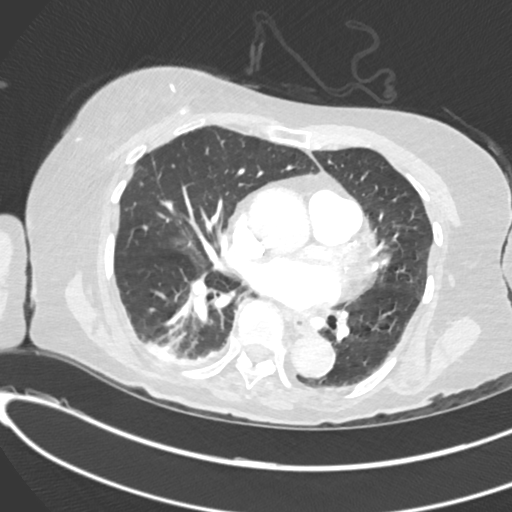
[im 167/301  mediastinal]
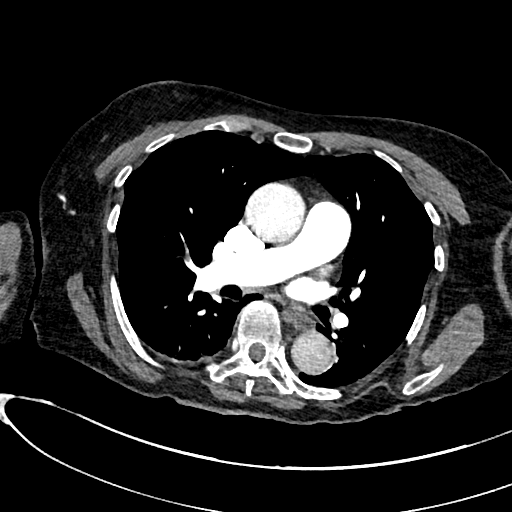
[im 184/301  lung]
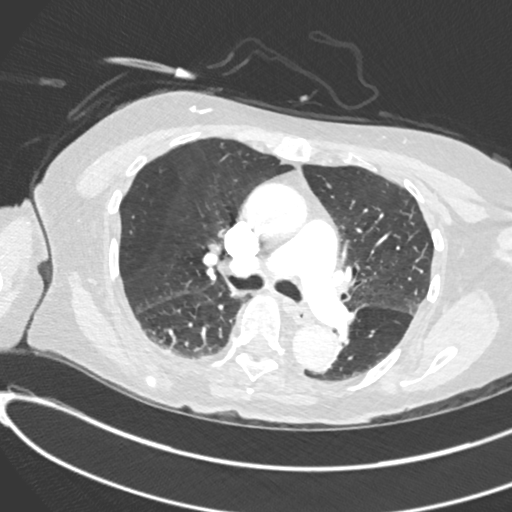
[im 201/301  mediastinal]
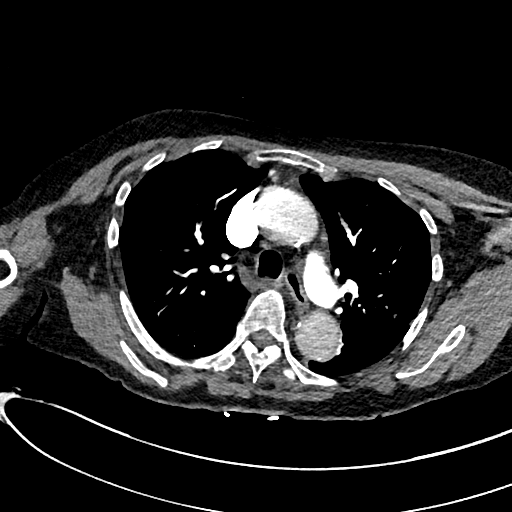
[im 217/301  lung]
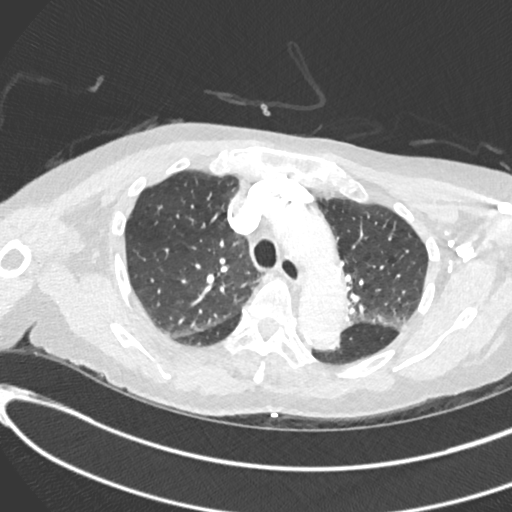
[im 234/301  mediastinal]
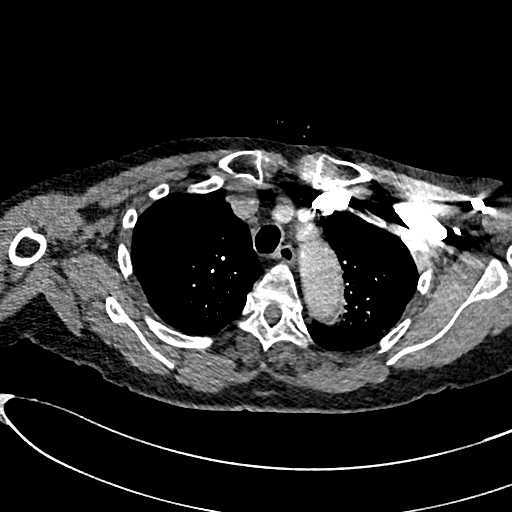
[im 251/301  lung]
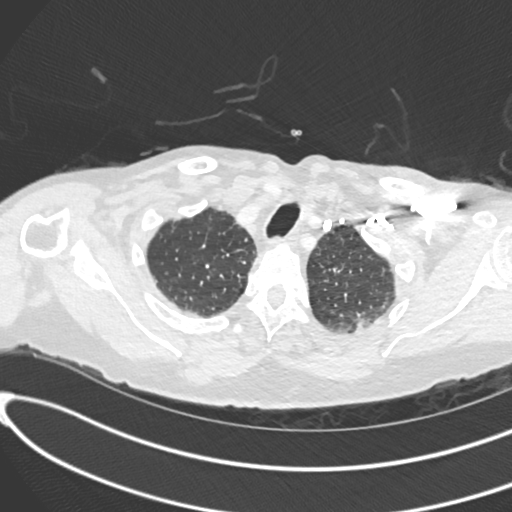
[im 267/301  mediastinal]
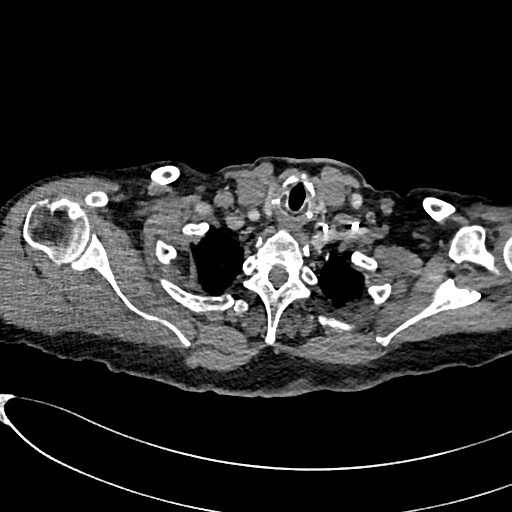
[im 284/301  lung]
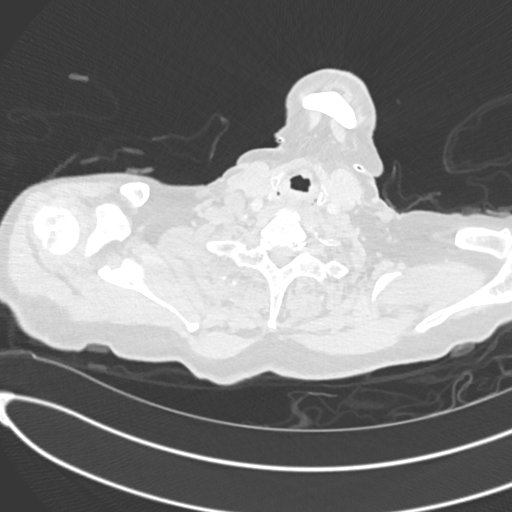

[Series 8: pe 2mm cor · coronal · 0.59mm/px · 1 of 151 slices shown]
[im 76/151  mediastinal]
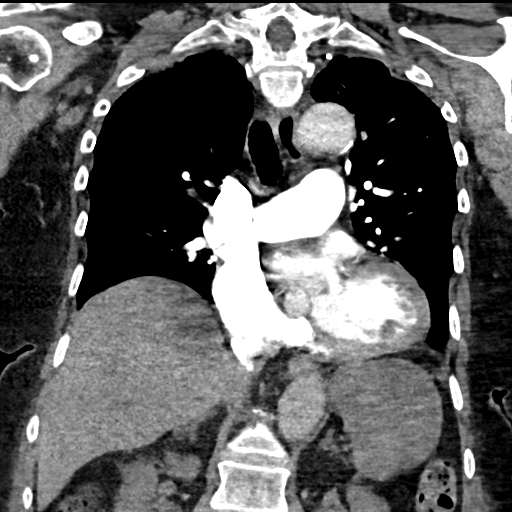

[18 of 36 positions shown; findings below may reference images not displayed]

FINDINGS: Cardiovascular: There is a focal pulmonary embolus in the proximal
posterior segment right upper lobe pulmonary artery. There is also a
pulmonary embolus in a segmental branch of the anterior segment
right upper lobe pulmonary artery. There is no more central
pulmonary embolus evident. The new right ventricle to left ventricle
diameter ratio is less than 0.9, not consistent with right heart
strain.

The ascending thoracic aortic diameter measures 4.1 x 4.1 cm,
measured on axial slice 69 series 5. There is no thoracic aortic
dissection. The visualized great vessels appear normal. There are
scattered foci of atherosclerotic calcification in the aorta. There
are multiple foci of coronary artery calcification. Pericardium is
not appreciably thickened.

Mediastinum/Nodes: Thyroid appears normal. There is no appreciable
thoracic adenopathy.

Lungs/Pleura: There is patchy airspace consolidation in both
posterior lung bases. There is also bibasilar atelectatic change.
Lungs elsewhere are clear. There is no appreciable pleural effusion
or pleural thickening.

Upper Abdomen: There is atherosclerotic calcification in the upper
abdominal aorta. Visualized upper abdominal structures are otherwise
unremarkable.

Musculoskeletal: There is degenerative change in the thoracic spine.
No blastic or lytic bone lesions.

Review of the MIP images confirms the above findings.
IMPRESSION: There are right upper lobe pulmonary artery branch pulmonary emboli.
No more central pulmonary embolus. No demonstrable right heart
strain.

Ascending thoracic aorta measures 4.1 x 4.1 cm. Recommend annual
imaging followup by CTA or MRA. This recommendation follows 4434
ACCF/AHA/AATS/ACR/ASA/SCA/FATY/MIJIL/WINDY/OLY Guidelines for the
Diagnosis and Management of Patients with Thoracic Aortic Disease.
Circulation. 4434; 121: [id number]. No demonstrable dissection.

Areas of atherosclerotic calcification including foci of coronary
artery calcification.

Bibasilar airspace consolidation. Patchy bibasilar atelectasis also
present.

No demonstrable adenopathy.

Comment: This patient was sent from a [HOSPITAL] for the CT
evaluation. Given the positive findings of acute pulmonary emboli in
right upper lobe pulmonary artery branches and inability to promptly
reach the referring physician, decision was made to transfer the
patient from CT to the [HOSPITAL] [HOSPITAL].

## 2019-09-21 ENCOUNTER — Inpatient Hospital Stay (HOSPITAL_COMMUNITY)
Admission: EM | Admit: 2019-09-21 | Discharge: 2019-09-26 | DRG: 065 | Disposition: A | Discharge status: Skilled Nursing Facility | Payer: Medicare Other | Source: Skilled Nursing Facility | Attending: Internal Medicine | Admitting: Internal Medicine

## 2019-09-21 ENCOUNTER — Encounter (HOSPITAL_COMMUNITY): Payer: Self-pay | Admitting: Emergency Medicine

## 2019-09-21 ENCOUNTER — Emergency Department (HOSPITAL_COMMUNITY): Payer: Medicare Other

## 2019-09-21 ENCOUNTER — Other Ambulatory Visit: Payer: Self-pay

## 2019-09-21 DIAGNOSIS — R29726 NIHSS score 26: Secondary | ICD-10-CM | POA: Diagnosis present

## 2019-09-21 DIAGNOSIS — Z809 Family history of malignant neoplasm, unspecified: Secondary | ICD-10-CM | POA: Diagnosis not present

## 2019-09-21 DIAGNOSIS — E440 Moderate protein-calorie malnutrition: Secondary | ICD-10-CM | POA: Diagnosis not present

## 2019-09-21 DIAGNOSIS — Z8249 Family history of ischemic heart disease and other diseases of the circulatory system: Secondary | ICD-10-CM | POA: Diagnosis not present

## 2019-09-21 DIAGNOSIS — Z8679 Personal history of other diseases of the circulatory system: Secondary | ICD-10-CM

## 2019-09-21 DIAGNOSIS — E785 Hyperlipidemia, unspecified: Secondary | ICD-10-CM | POA: Diagnosis present

## 2019-09-21 DIAGNOSIS — F0391 Unspecified dementia with behavioral disturbance: Secondary | ICD-10-CM | POA: Diagnosis present

## 2019-09-21 DIAGNOSIS — Z833 Family history of diabetes mellitus: Secondary | ICD-10-CM | POA: Diagnosis not present

## 2019-09-21 DIAGNOSIS — H9190 Unspecified hearing loss, unspecified ear: Secondary | ICD-10-CM | POA: Diagnosis present

## 2019-09-21 DIAGNOSIS — R1310 Dysphagia, unspecified: Secondary | ICD-10-CM | POA: Diagnosis present

## 2019-09-21 DIAGNOSIS — E039 Hypothyroidism, unspecified: Secondary | ICD-10-CM | POA: Diagnosis present

## 2019-09-21 DIAGNOSIS — Z6821 Body mass index (BMI) 21.0-21.9, adult: Secondary | ICD-10-CM

## 2019-09-21 DIAGNOSIS — F419 Anxiety disorder, unspecified: Secondary | ICD-10-CM | POA: Diagnosis present

## 2019-09-21 DIAGNOSIS — M8580 Other specified disorders of bone density and structure, unspecified site: Secondary | ICD-10-CM | POA: Diagnosis present

## 2019-09-21 DIAGNOSIS — I671 Cerebral aneurysm, nonruptured: Secondary | ICD-10-CM | POA: Diagnosis present

## 2019-09-21 DIAGNOSIS — Z86711 Personal history of pulmonary embolism: Secondary | ICD-10-CM

## 2019-09-21 DIAGNOSIS — G8194 Hemiplegia, unspecified affecting left nondominant side: Secondary | ICD-10-CM | POA: Diagnosis present

## 2019-09-21 DIAGNOSIS — Z7982 Long term (current) use of aspirin: Secondary | ICD-10-CM

## 2019-09-21 DIAGNOSIS — Z87891 Personal history of nicotine dependence: Secondary | ICD-10-CM

## 2019-09-21 DIAGNOSIS — I63511 Cerebral infarction due to unspecified occlusion or stenosis of right middle cerebral artery: Secondary | ICD-10-CM | POA: Diagnosis not present

## 2019-09-21 DIAGNOSIS — Z8489 Family history of other specified conditions: Secondary | ICD-10-CM

## 2019-09-21 DIAGNOSIS — Z79890 Hormone replacement therapy (postmenopausal): Secondary | ICD-10-CM

## 2019-09-21 DIAGNOSIS — K219 Gastro-esophageal reflux disease without esophagitis: Secondary | ICD-10-CM | POA: Diagnosis present

## 2019-09-21 DIAGNOSIS — Z66 Do not resuscitate: Secondary | ICD-10-CM | POA: Diagnosis present

## 2019-09-21 DIAGNOSIS — I10 Essential (primary) hypertension: Secondary | ICD-10-CM | POA: Diagnosis present

## 2019-09-21 DIAGNOSIS — Z20828 Contact with and (suspected) exposure to other viral communicable diseases: Secondary | ICD-10-CM | POA: Diagnosis present

## 2019-09-21 DIAGNOSIS — R414 Neurologic neglect syndrome: Secondary | ICD-10-CM | POA: Diagnosis present

## 2019-09-21 DIAGNOSIS — Z91030 Bee allergy status: Secondary | ICD-10-CM

## 2019-09-21 DIAGNOSIS — Z853 Personal history of malignant neoplasm of breast: Secondary | ICD-10-CM

## 2019-09-21 DIAGNOSIS — I6389 Other cerebral infarction: Secondary | ICD-10-CM | POA: Diagnosis not present

## 2019-09-21 DIAGNOSIS — R29810 Facial weakness: Secondary | ICD-10-CM | POA: Diagnosis present

## 2019-09-21 DIAGNOSIS — Z79899 Other long term (current) drug therapy: Secondary | ICD-10-CM

## 2019-09-21 DIAGNOSIS — Z1389 Encounter for screening for other disorder: Secondary | ICD-10-CM

## 2019-09-21 DIAGNOSIS — I351 Nonrheumatic aortic (valve) insufficiency: Secondary | ICD-10-CM | POA: Diagnosis not present

## 2019-09-21 DIAGNOSIS — F329 Major depressive disorder, single episode, unspecified: Secondary | ICD-10-CM | POA: Diagnosis present

## 2019-09-21 DIAGNOSIS — G40909 Epilepsy, unspecified, not intractable, without status epilepticus: Secondary | ICD-10-CM | POA: Diagnosis present

## 2019-09-21 DIAGNOSIS — E46 Unspecified protein-calorie malnutrition: Secondary | ICD-10-CM | POA: Diagnosis present

## 2019-09-21 DIAGNOSIS — Z823 Family history of stroke: Secondary | ICD-10-CM

## 2019-09-21 DIAGNOSIS — I639 Cerebral infarction, unspecified: Secondary | ICD-10-CM | POA: Diagnosis present

## 2019-09-21 LAB — COMPREHENSIVE METABOLIC PANEL
ALT: 10 U/L (ref 0–44)
AST: 18 U/L (ref 15–41)
Albumin: 3.5 g/dL (ref 3.5–5.0)
Alkaline Phosphatase: 98 U/L (ref 38–126)
Anion gap: 9 (ref 5–15)
BUN: 24 mg/dL — ABNORMAL HIGH (ref 8–23)
CO2: 25 mmol/L (ref 22–32)
Calcium: 9.4 mg/dL (ref 8.9–10.3)
Chloride: 107 mmol/L (ref 98–111)
Creatinine, Ser: 1.01 mg/dL — ABNORMAL HIGH (ref 0.44–1.00)
GFR calc Af Amer: 58 mL/min — ABNORMAL LOW (ref >60)
GFR calc non Af Amer: 50 mL/min — ABNORMAL LOW (ref >60)
Glucose, Bld: 108 mg/dL — ABNORMAL HIGH (ref 70–99)
Potassium: 3.8 mmol/L (ref 3.5–5.1)
Sodium: 141 mmol/L (ref 135–145)
Total Bilirubin: 0.2 mg/dL — ABNORMAL LOW (ref 0.3–1.2)
Total Protein: 7.1 g/dL (ref 6.5–8.1)

## 2019-09-21 LAB — I-STAT CHEM 8, ED
BUN: 26 mg/dL — ABNORMAL HIGH (ref 8–23)
Calcium, Ion: 1.12 mmol/L — ABNORMAL LOW (ref 1.15–1.40)
Chloride: 107 mmol/L (ref 98–111)
Creatinine, Ser: 0.8 mg/dL (ref 0.44–1.00)
Glucose, Bld: 101 mg/dL — ABNORMAL HIGH (ref 70–99)
HCT: 41 % (ref 36.0–46.0)
Hemoglobin: 13.9 g/dL (ref 12.0–15.0)
Potassium: 3.7 mmol/L (ref 3.5–5.1)
Sodium: 142 mmol/L (ref 135–145)
TCO2: 23 mmol/L (ref 22–32)

## 2019-09-21 LAB — DIFFERENTIAL
Abs Immature Granulocytes: 0.01 10*3/uL (ref 0.00–0.07)
Basophils Absolute: 0 10*3/uL (ref 0.0–0.1)
Basophils Relative: 1 %
Eosinophils Absolute: 0.1 10*3/uL (ref 0.0–0.5)
Eosinophils Relative: 1 %
Immature Granulocytes: 0 %
Lymphocytes Relative: 30 %
Lymphs Abs: 1.9 10*3/uL (ref 0.7–4.0)
Monocytes Absolute: 0.6 10*3/uL (ref 0.1–1.0)
Monocytes Relative: 9 %
Neutro Abs: 3.8 10*3/uL (ref 1.7–7.7)
Neutrophils Relative %: 59 %

## 2019-09-21 LAB — CBC
HCT: 40.9 % (ref 36.0–46.0)
Hemoglobin: 13.3 g/dL (ref 12.0–15.0)
MCH: 33.7 pg (ref 26.0–34.0)
MCHC: 32.5 g/dL (ref 30.0–36.0)
MCV: 103.5 fL — ABNORMAL HIGH (ref 80.0–100.0)
Platelets: 231 10*3/uL (ref 150–400)
RBC: 3.95 MIL/uL (ref 3.87–5.11)
RDW: 12.2 % (ref 11.5–15.5)
WBC: 6.4 10*3/uL (ref 4.0–10.5)
nRBC: 0 % (ref 0.0–0.2)

## 2019-09-21 LAB — APTT: aPTT: 28 seconds (ref 24–36)

## 2019-09-21 LAB — PROTIME-INR
INR: 1 (ref 0.8–1.2)
Prothrombin Time: 13 seconds (ref 11.4–15.2)

## 2019-09-21 MED ORDER — SENNOSIDES-DOCUSATE SODIUM 8.6-50 MG PO TABS
2.0000 | ORAL_TABLET | Freq: Two times a day (BID) | ORAL | Status: DC
  Administered 2019-09-23 – 2019-09-26 (×7): 2 via ORAL
  Filled 2019-09-21 (×7): qty 2

## 2019-09-21 MED ORDER — ESCITALOPRAM OXALATE 10 MG PO TABS
10.0000 mg | ORAL_TABLET | Freq: Every day | ORAL | Status: DC
  Administered 2019-09-23 – 2019-09-26 (×4): 10 mg via ORAL
  Filled 2019-09-21 (×4): qty 1

## 2019-09-21 MED ORDER — ACETAMINOPHEN 325 MG PO TABS
650.0000 mg | ORAL_TABLET | ORAL | Status: DC | PRN
  Administered 2019-09-24: 650 mg via ORAL
  Filled 2019-09-21: qty 2

## 2019-09-21 MED ORDER — STROKE: EARLY STAGES OF RECOVERY BOOK
Freq: Once | Status: DC
  Filled 2019-09-21 (×2): qty 1

## 2019-09-21 MED ORDER — ACETAMINOPHEN 160 MG/5ML PO SOLN: 650.0000 mg | ORAL | Status: DC | PRN

## 2019-09-21 MED ORDER — ENOXAPARIN SODIUM 40 MG/0.4ML ~~LOC~~ SOLN
40.0000 mg | SUBCUTANEOUS | Status: DC
  Administered 2019-09-22 – 2019-09-25 (×5): 40 mg via SUBCUTANEOUS
  Filled 2019-09-21 (×6): qty 0.4

## 2019-09-21 MED ORDER — LEVOTHYROXINE SODIUM 75 MCG PO TABS: 37.5000 ug | ORAL_TABLET | Freq: Every day | ORAL | Status: DC

## 2019-09-21 MED ORDER — SENNOSIDES-DOCUSATE SODIUM 8.6-50 MG PO TABS: 1.0000 | ORAL_TABLET | Freq: Every evening | ORAL | Status: DC | PRN

## 2019-09-21 MED ORDER — LABETALOL HCL 5 MG/ML IV SOLN
5.0000 mg | INTRAVENOUS | Status: DC | PRN
  Administered 2019-09-22: 5 mg via INTRAVENOUS
  Filled 2019-09-21: qty 4

## 2019-09-21 MED ORDER — ACETAMINOPHEN 650 MG RE SUPP
650.0000 mg | RECTAL | Status: DC | PRN
  Administered 2019-09-23: 12:00:00 650 mg via RECTAL
  Filled 2019-09-21: qty 1

## 2019-09-21 MED ORDER — SODIUM CHLORIDE 0.9% FLUSH: 3.0000 mL | Freq: Once | INTRAVENOUS | Status: DC

## 2019-09-21 MED ORDER — SODIUM CHLORIDE 0.9 % IV SOLN
INTRAVENOUS | Status: DC
  Administered 2019-09-22 (×2): via INTRAVENOUS

## 2019-09-21 MED ORDER — PRAVASTATIN SODIUM 40 MG PO TABS: 40.0000 mg | ORAL_TABLET | Freq: Every day | ORAL | Status: DC

## 2019-09-21 MED ORDER — FAMOTIDINE 20 MG PO TABS
20.0000 mg | ORAL_TABLET | Freq: Two times a day (BID) | ORAL | Status: DC
  Administered 2019-09-23 – 2019-09-26 (×7): 20 mg via ORAL
  Filled 2019-09-21 (×7): qty 1

## 2019-09-21 MED ORDER — ASPIRIN 325 MG PO TABS: 325.0000 mg | ORAL_TABLET | Freq: Every day | ORAL | Status: DC

## 2019-09-21 NOTE — Consult Note (Signed)
Referring Physician: Dr. Tyrone Nine    Chief Complaint: Acute onset of left hemiplegia  HPI: Pamela Choi is an 85 y.o. female nursing home resident presenting to the ED with acute onset of left hemiplegia. LKN was at 5 PM, which is when the symptoms were witnessed to have started by nursing home staff. When EMS arrived, the patient continued to be weak.   CT head in the ED revealed no acute hemorrhage or hypodensity. A large calcified right MCA fusiform aneurysm was noted, precluding tPA administration due to high risk of aneurysmal rupture. She was not a candidate for thrombectomy due to pre-existing level of disability requiring 24 hour care and supervision at the SNF.   LSN: 5:00 PM tPA Given: No: A large calcified right MCA fusiform aneurysm was noted, precluding tPA administration due to high risk of aneurysmal rupture.   Past Medical History:  Diagnosis Date  . Acute pericarditis 07/20/2015  . Allergy   . ANKLE SPRAIN, RIGHT 05/10/2007   Qualifier: Diagnosis of  By: Norma Fredrickson MD, Larena Glassman    . Anxiety and depression   . 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 Chicken pox   . Hyperlipidemia   . Hypertension   . Mild dementia    eval with neurology in 2014, mod SVD, declined tx  . Osteopenia   . Pericardial effusion    a. 06/2015 Echo: EF 55%, mild LVH, mild AI, mild dil of Asc Ao (31m, 319m@ root), nl RV fxn, mild TR, small to mod circumferential pericardial effusion.  . Pericarditis    a. 06/2015  . Plantar wart of right foot   . TOBACCO ABUSE, HX OF 11/08/2006   Qualifier: Diagnosis of  By: SiLinden DolinD, AaMarjory Lies    Past Surgical History:  Procedure Laterality Date  . BREAST SURGERY    . BUNIONECTOMY     R foot  . HAMMER TOE SURGERY     Right foot    Family History  Problem Relation Age of Onset   . Diabetes Mother   . Hypertension Mother   . Cancer Brother   . Heart attack Brother   . Stroke Brother   . Stroke Brother    Social History:  reports that she quit smoking about 16 years ago. She has never used smokeless tobacco. She reports that she does not drink alcohol or use drugs.  Allergies:  Allergies  Allergen Reactions  . Bee Venom Anaphylaxis    Medications:   No current facility-administered medications on file prior to encounter.    Current Outpatient Medications on File Prior to Encounter  Medication Sig Dispense Refill  . acetaminophen (TYLENOL) 325 MG tablet Take 650 mg by mouth every 8 (eight) hours.    . Marland Kitchenspirin EC 81 MG tablet Take 81 mg by mouth daily.    . calcium-vitamin D (OSCAL WITH D) 500-200 MG-UNIT tablet Take 1 tablet by mouth daily with breakfast.    . cetirizine (ZYRTEC) 10 MG tablet Take 1 tablet (10 mg total) by mouth daily. 30 tablet 6  . cholecalciferol (VITAMIN D) 1000 units tablet Take 3,000 Units by mouth daily.    . divalproex (DEPAKOTE SPRINKLE) 125 MG capsule Take 125-250 mg by mouth 2 (two) times daily. 250 MG in the morning, 500 MG at 1 PM and 125 MG in the  evening    . donepezil (ARICEPT) 10 MG tablet TAKE 1 TABLET(10 MG) BY MOUTH AT BEDTIME (Patient taking differently: Take 10 mg by mouth at bedtime. ) 90 tablet 3  . escitalopram (LEXAPRO) 5 MG tablet Take 1 tablet (5 mg total) by mouth daily. 90 tablet 3  . feeding supplement (BOOST / RESOURCE BREEZE) LIQD Take 1 Container by mouth 2 (two) times daily between meals.    . lactulose (CHRONULAC) 10 GM/15ML solution Take 30 g by mouth every other day.    Marland Kitchen LORazepam (ATIVAN) 0.5 MG tablet Take 0.75 mg by mouth at bedtime.    . Melatonin 1 MG TABS Take 2 mg by mouth at bedtime.    Marland Kitchen oxyCODONE (OXY IR/ROXICODONE) 5 MG immediate release tablet Take 1 tablet (5 mg total) by mouth every 4 (four) hours as needed for moderate pain. (Patient not taking: Reported on 02/13/2018) 10 tablet 0  .  polyethylene glycol (MIRALAX / GLYCOLAX) packet Take 17 g by mouth daily.    . pravastatin (PRAVACHOL) 40 MG tablet TAKE 1 TABLET BY MOUTH EVERY DAY (Patient taking differently: TAKE 1 TABLET BY MOUTH DAILY AT BEDTIME) 90 tablet 2  . sennosides-docusate sodium (SENOKOT-S) 8.6-50 MG tablet Take 2 tablets by mouth 2 (two) times daily.    Marland Kitchen warfarin (COUMADIN) 2 MG tablet Take 2 mg by mouth daily.    Correction: Per SNF records accompanying patient to the ED today, she is not taking warfarin.    ROS: Unable to obtain due to cognitive/communication deficit.   Physical Examination: There were no vitals taken for this visit.  HEENT: [place]/AT. Drooling noted.  Lungs: Respirations unlabored.  Ext: Warm and well perfused  Neurologic Examination: Ment: Awake with decreased level of alertness and dense left hemineglect. Not answering questions, but some fluent verbal output noted with noxious stimulus.  CN: PERRL. Does not blink to threat on right or left. Eyes tonically deviated to the right and will not cross midline to the left. Left facial droop.  Motor: Increased tone in all 4 extremities Will not elevate arms, resist or grip to command. With passive elevation of BUE, LUE drops immediately to bed after release, with RUE returning to bed slowly after release.  Will not move BLE to command. Exclaims in discomfort when passively attempting to lift either lower extremitiy. No movement of LLE to plantar stimulation. Brisk withdrawal of RLE to plantar stimulation.  Sensory: As above.  Reflexes:3+ reflexes in bilateral upper extremities and patellae, with brisker reflexes on the left relative to the right.  Cerebellar: Not following commands for testing.  Gait: Unable to assess.   Results for orders placed or performed during the hospital encounter of 09/21/19 (from the past 48 hour(s))  Protime-INR     Status: None   Collection Time: 09/21/19  6:53 PM  Result Value Ref Range   Prothrombin Time 13.0  11.4 - 15.2 seconds   INR 1.0 0.8 - 1.2    Comment: (NOTE) INR goal varies based on device and disease states. Performed at [facility], [place] [address] [place], [place] [address]   APTT     Status: None   Collection Time: 09/21/19  6:53 PM  Result Value Ref Range   aPTT 28 24 - 36 seconds    Comment: Performed at [facility] [address] [place], [place] [address]  CBC     Status: Abnormal   Collection Time: 09/21/19  6:53 PM  Result Value Ref  Range   WBC 6.4 4.0 - 10.5 K/uL   RBC 3.95 3.87 - 5.11 MIL/uL   Hemoglobin 13.3 12.0 - 15.0 g/dL   HCT 40.9 36.0 - 46.0 %   MCV 103.5 (H) 80.0 - 100.0 fL   MCH 33.7 26.0 - 34.0 pg   MCHC 32.5 30.0 - 36.0 g/dL   RDW 12.2 11.5 - 15.5 %   Platelets 231 150 - 400 K/uL   nRBC 0.0 0.0 - 0.2 %    Comment: Performed at [facility], [place] [address] [place], [place] [address]  Differential     Status: None   Collection Time: 09/21/19  6:53 PM  Result Value Ref Range   Neutrophils Relative % 59 %   Neutro Abs 3.8 1.7 - 7.7 K/uL   Lymphocytes Relative 30 %   Lymphs Abs 1.9 0.7 - 4.0 K/uL   Monocytes Relative 9 %   Monocytes Absolute 0.6 0.1 - 1.0 K/uL   Eosinophils Relative 1 %   Eosinophils Absolute 0.1 0.0 - 0.5 K/uL   Basophils Relative 1 %   Basophils Absolute 0.0 0.0 - 0.1 K/uL   Immature Granulocytes 0 %   Abs Immature Granulocytes 0.01 0.00 - 0.07 K/uL    Comment: Performed at [facility] [address] [place], [place] [address]  Comprehensive metabolic panel     Status: Abnormal   Collection Time: 09/21/19  6:53 PM  Result Value Ref Range   Sodium 141 135 - 145 mmol/L   Potassium 3.8 3.5 - 5.1 mmol/L   Chloride 107 98 - 111 mmol/L   CO2 25 22 - 32 mmol/L   Glucose, Bld 108 (H) 70 - 99 mg/dL   BUN 24 (H) 8 - 23 mg/dL   Creatinine, Ser 1.01 (H) 0.44 - 1.00 mg/dL   Calcium 9.4 8.9 - 10.3 mg/dL   Total Protein 7.1 6.5 - 8.1 g/dL   Albumin 3.5 3.5 - 5.0 g/dL   AST 18 15 - 41 U/L   ALT 10 0 - 44  U/L   Alkaline Phosphatase 98 38 - 126 U/L   Total Bilirubin 0.2 (L) 0.3 - 1.2 mg/dL   GFR calc non Af Amer 50 (L) >60 mL/min   GFR calc Af Amer 58 (L) >60 mL/min   Anion gap 9 5 - 15    Comment: Performed at [facility] [address] [place], [place] [address]  I-stat chem 8, ED     Status: Abnormal   Collection Time: 09/21/19  6:57 PM  Result Value Ref Range   Sodium 142 135 - 145 mmol/L   Potassium 3.7 3.5 - 5.1 mmol/L   Chloride 107 98 - 111 mmol/L   BUN 26 (H) 8 - 23 mg/dL   Creatinine, Ser 0.80 0.44 - 1.00 mg/dL   Glucose, Bld 101 (H) 70 - 99 mg/dL   Calcium, Ion 1.12 (L) 1.15 - 1.40 mmol/L   TCO2 23 22 - 32 mmol/L   Hemoglobin 13.9 12.0 - 15.0 g/dL   HCT 41.0 36.0 - 46.0 %   Ct Head Code Stroke Wo Contrast  Result Date: 09/21/2019 CLINICAL DATA:  Code stroke.  Left-sided weakness.  Slurred speech. EXAM: CT HEAD WITHOUT CONTRAST TECHNIQUE: Contiguous axial images were obtained from the base of the skull through the vertex without intravenous contrast. COMPARISON:  05/06/2019 FINDINGS: Brain: Chronic ischemic changes are seen affecting the pons. 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 Old infarctions of the  thalami, more extensive on the left than the right, which were probably acute in May. Cerebral hemispheres elsewhere show old small vessel infarctions of the basal ganglia and chronic confluent small-vessel change of the white matter. No acute cortical or large vessel territory infarction is visible. No mass, hemorrhage, hydrocephalus or extra-axial collection. Vascular: There is atherosclerotic calcification of the major vessels at the base of the brain. 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 Skull: Negative Sinuses/Orbits: Clear/normal Other: None ASPECTS ([place] Stroke Program Early CT Score) - Ganglionic level infarction (caudate, lentiform nuclei, internal capsule, insula, M1-M3  cortex): 7 - Supraganglionic infarction (M4-M6 cortex): 3 Total score (0-10 with 10 being normal): 10 IMPRESSION: 1. Extensive chronic ischemic changes throughout the brain as outlined above. No sign of acute infarction by CT. 2. 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 3. ASPECTS is 10. 4. The case was discussed with Dr. Cheral Marker at 1910 hours while the examination was still preliminary. Electronically Signed   By: Nelson Chimes M.D.   On: 09/21/2019 19:21    Assessment: 85 y.o. female presenting with acute onset of left hemiplegia.  1. Exam findings best localize to the right MCA territory, most likely secondary to a large ischemic stroke.  2. CT head reveals no acute hypodensity. Diffuse cerebral atrophy and old ischemic infarctions are noted.  3. Stroke Risk Factors - History of cancer, HTN, HLD, prior strokes visible on CT, history of tobacco abuse.  4. Also noted on CT head is a large calcified right MCA fusiform aneurysm, which precludes tPA administration due to high risk of aneurysmal rupture.  5. Not a VIR candidate due to baseline level of disability.   Recommendations: 1. HgbA1c, fasting lipid panel 2. MRI, MRA  of the brain without contrast 3. PT consult, OT consult, Speech consult 4. Echocardiogram 5. Carotid dopplers 6. Prophylactic therapy- ASA 325 mg po qd 7. Risk factor modification 8. Telemetry monitoring 9. Frequent neuro checks 10. Modified permissive HTN protocol due to advanced age/frailty. Correct if SBP > 180.    _0  signed: Dr. Kerney Elbe 09/21/2019, 7:26 PM

## 2019-09-21 NOTE — ED Triage Notes (Signed)
Pt BIB [facility] from [facility]. Per EMS pt began having slurred speech, drooling and left-sided weakness @ 1700. Per EMS pt with left sided weakness and forced gaze to the right. Pt drooling upon EMS arrival.

## 2019-09-21 NOTE — H&P (Signed)
History and Physical    ADYSON VANBUREN ZOX:[id number] DOB: Nov 01, 1930 DOA: 09/21/2019  PCP: Lucretia Kern, DO  Patient coming from: Nursing facility  I have personally briefly reviewed patient's old medical records in [place]  Chief Complaint: Slurred speech, left-sided facial droop per nursing staff  HPI: TIONDRA FANG is a 85 y.o. female with medical history significant of dementia, hypertension, hyperlipidemia, depression and anxiety who presented as a CODE STROKE with left-sided neglect, left-sided facial drooping and drooling.  Patient reportedly was found by nursing staff at 1700 today with slurred speech, drooping and left-sided weakness.  Patient has severe dementia and incoherent speech at baseline so son at bedside was able to provide some history.  He states that patient has been in the nursing home for 3 years ever since fall and has progressively had worsening memory issues.  She is mostly non-verbal and sometimes able to answer questions although speech is mostly incoherent.  She requires assistance with all activities of daily living at nursing facility including feeding.  Patient was able to follow some simple commands but incoherent when attempting to answer questions.  ED Course: She was afebrile and mildly hypertensive with a systolic 811B over 147.  CBC showed no leukocytosis or anemia.  BMP showed mildly elevated creatinine of 1.01 which is around her baseline.  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  She was evaluated by neurologist Dr. Cheral Marker and she was deemed not a candidate for TPA secondary to high risk of aneurysmal rupture.  Also not a candidate for thrombectomy due to pre-existing disability requiring 24-hour care.  Review of Systems:  Unable to obtain due to patient's baseline dementia  Past Medical  History:  Diagnosis Date  . Acute pericarditis 07/20/2015  . Allergy   . ANKLE SPRAIN, RIGHT 05/10/2007   Qualifier: Diagnosis of  By: Norma Fredrickson MD, Larena Glassman    . Anxiety and depression   . 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 Chicken pox   . Hyperlipidemia   . Hypertension   . Mild dementia ([place])    eval with neurology in 2014, mod SVD, declined tx  . Osteopenia   . Pericardial effusion    a. 06/2015 Echo: EF 55%, mild LVH, mild AI, mild dil of Asc Ao (5m, 345m@ root), nl RV fxn, mild TR, small to mod circumferential pericardial effusion.  . Pericarditis    a. 06/2015  . Plantar wart of right foot   . TOBACCO ABUSE, HX OF 11/08/2006   Qualifier: Diagnosis of  By: SiLinden DolinD, AaMarjory Lies    Past Surgical History:  Procedure Laterality Date  . BREAST SURGERY    . BUNIONECTOMY     R foot  . HAMMER TOE SURGERY     Right foot     reports that she quit smoking about 16 years ago. She has never used smokeless tobacco. She reports that she does not drink alcohol or use drugs.  Allergies  Allergen Reactions  . Bee Venom Anaphylaxis    Family History  Problem Relation Age of Onset  . Diabetes Mother   . Hypertension Mother   . Cancer Brother   . Heart attack Brother   .  Stroke Brother   . Stroke Brother    : Family history reviewed.   Prior to Admission medications   Medication Sig Start Date End Date Taking? Authorizing Provider  acetaminophen (TYLENOL) 325 MG tablet Take 650 mg by mouth every 8 (eight) hours.   Yes [provider]  amLODipine (NORVASC) 2.5 MG tablet Take 5 mg by mouth daily. HOLD IF SYSTOLIC NUMBER IS <858   Yes [provider]  barrier cream (NON-SPECIFIED) CREA Apply 1 application topically 3 (three) times daily.   Yes [provider]  bisacodyl  (DULCOLAX) 10 MG suppository Place 10 mg rectally every evening.   Yes [provider]  calcium-vitamin D (OYSTER SHELL CALCIUM/D3) 500-400 MG-UNIT tablet Take 1 tablet by mouth daily.   Yes [provider]  Cholecalciferol (VITAMIN D-3) 25 MCG (1000 UT) CAPS Take 3,000 Units by mouth daily.   Yes [provider]  escitalopram (LEXAPRO) 5 MG tablet Take 1 tablet (5 mg total) by mouth daily. Patient taking differently: Take 10 mg by mouth daily.  12/30/16  Yes Colin Benton R, DO  famotidine (PEPCID) 20 MG tablet Take 20 mg by mouth 2 (two) times daily.   Yes [provider]  lactulose (CHRONULAC) 10 GM/15ML solution Take 30 g by mouth daily.    Yes [provider]  levETIRAcetam (KEPPRA) 250 MG tablet Take 125 mg by mouth 2 (two) times daily.   Yes [provider]  levothyroxine (SYNTHROID) 25 MCG tablet Take 37.5 mcg by mouth daily before breakfast.   Yes [provider]  loratadine (CLARITIN) 10 MG tablet Take 10 mg by mouth daily.   Yes [provider]  metoCLOPramide (REGLAN) 5 MG tablet Take 2.5 mg by mouth 3 (three) times daily with meals.   Yes [provider]  montelukast (SINGULAIR) 10 MG tablet Take 10 mg by mouth at bedtime.   Yes [provider]  NON FORMULARY See admin instructions. Magic Cup: Drink 1 cup by mouth three times a day   Yes [provider]  NON FORMULARY Take 120 mLs by mouth See admin instructions. MedPass: Drink 120 ml's by mouth three times a day   Yes [provider]  Nutritional Supplements (FEEDING SUPPLEMENT, BOOST BREEZE,) LIQD Take 237 mLs by mouth 2 (two) times daily.   Yes [provider]  pantoprazole (PROTONIX) 40 MG tablet Take 40 mg by mouth daily before breakfast.   Yes [provider]  polyethylene glycol (MIRALAX / GLYCOLAX) packet Take 17 g by mouth 2 (two) times daily. MIXED INTO 8 OUNCES OF FLUID   Yes [provider]   pravastatin (PRAVACHOL) 40 MG tablet TAKE 1 TABLET BY MOUTH EVERY DAY Patient taking differently: Take 40 mg by mouth at bedtime.  01/31/17  Yes Colin Benton R, DO  sennosides-docusate sodium (SENOKOT-S) 8.6-50 MG tablet Take 2 tablets by mouth 2 (two) times daily.   Yes [provider]  aspirin EC 81 MG tablet Take 81 mg by mouth daily.    [provider]  cetirizine (ZYRTEC) 10 MG tablet Take 1 tablet (10 mg total) by mouth daily. Patient not taking: Reported on 09/21/2019 08/14/12   Pedro Earls, MD  divalproex (DEPAKOTE SPRINKLE) 125 MG capsule Take 125-250 mg by mouth 2 (two) times daily. 250 MG in the morning, 500 MG at 1 PM and 125 MG in the evening 02/11/18   [provider]  donepezil (ARICEPT) 10 MG tablet TAKE 1 TABLET(10 MG) BY MOUTH AT  BEDTIME Patient not taking: Reported on 09/21/2019 10/27/16   Kathrynn Ducking, MD  LORazepam (ATIVAN) 0.5 MG tablet Take 0.75 mg by mouth at bedtime.    [provider]  Melatonin 1 MG TABS Take 2 mg by mouth at bedtime.    [provider]  oxyCODONE (OXY IR/ROXICODONE) 5 MG immediate release tablet Take 1 tablet (5 mg total) by mouth every 4 (four) hours as needed for moderate pain. Patient not taking: Reported on 09/21/2019 03/02/17   Caren Griffins, MD  warfarin (COUMADIN) 2 MG tablet Take 2 mg by mouth daily.    [provider]    Physical Exam: Vitals:   09/21/19 1929 09/21/19 2015 09/21/19 2045 09/21/19 2115  BP: (!) 164/103 (!) 162/94 126/85 (!) 141/86  Pulse: 79 84 83 84  Resp: 16 (!) 21 20 (!) 21  Temp: 97.7 F (36.5 C)     TempSrc: Oral     SpO2: 100% 94% 93% 95%    Constitutional: Thin frail elderly female comfortable lying mostly on her right side at 20 degrees in bed Vitals:   09/21/19 1929 09/21/19 2015 09/21/19 2045 09/21/19 2115  BP: (!) 164/103 (!) 162/94 126/85 (!) 141/86  Pulse: 79 84 83 84  Resp: 16 (!) 21 20 (!) 21  Temp: 97.7 F (36.5 C)     TempSrc: Oral      SpO2: 100% 94% 93% 95%   Eyes: PERRL with small bilateral 2 mm pupils, lids and conjunctivae normal ENMT: Mucous membranes are moist. Posterior pharynx clear of any exudate or lesions.Normal dentition.  She frequently clears her throat and appears to have difficulty clearing her airway. Neck: normal, supple, no masses Respiratory: clear to auscultation bilaterally, no wheezing, no crackles. Normal respiratory effort on room air. No accessory muscle use.  Cardiovascular: Regular rate and rhythm, no murmurs / rubs / gallops. No extremity edema. 2+ pedal pulses. No carotid bruits.  Abdomen: no tenderness, no masses palpated. No hepatosplenomegaly. Bowel sounds positive.  Musculoskeletal: no clubbing / cyanosis. No joint deformity upper and lower extremities. Good ROM, no contractures. Normal muscle tone.  Skin: no rashes, lesions, ulcers. No induration Neurologic: Notable notable facial asymmetry with left-sided droop, weak right hand grasp in left-sided upper extremity neglect.  DTR normal. Strength 1/5 in all lower extremity but is able to dorsiflex her left foot better than the right.  Unable to perform finger-nose or heel-to-shin.  Incoherent speech when attempting to answer questions.  Did not recognize family at bedside. Psychiatric: Dementia and required repeated prompting to complete simple commands.    Labs on Admission: I have personally reviewed following labs and imaging studies  CBC: Recent Labs  Lab 09/21/19 1853 09/21/19 1857  WBC 6.4  --   NEUTROABS 3.8  --   HGB 13.3 13.9  HCT 40.9 41.0  MCV 103.5*  --   PLT 231  --    Basic Metabolic Panel: Recent Labs  Lab 09/21/19 1853 09/21/19 1857  NA 141 142  K 3.8 3.7  CL 107 107  CO2 25  --   GLUCOSE 108* 101*  BUN 24* 26*  CREATININE 1.01* 0.80  CALCIUM 9.4  --    GFR: CrCl cannot be calculated (Unknown ideal weight.). Liver Function Tests: Recent Labs  Lab 09/21/19 1853  AST 18  ALT 10  ALKPHOS 98  BILITOT  0.2*  PROT 7.1  ALBUMIN 3.5   No results for input(s): LIPASE, AMYLASE in the last 168 hours. No results  for input(s): AMMONIA in the last 168 hours. Coagulation Profile: Recent Labs  Lab 09/21/19 1853  INR 1.0   Cardiac Enzymes: No results for input(s): CKTOTAL, CKMB, CKMBINDEX, TROPONINI in the last 168 hours. BNP (last 3 results) No results for input(s): PROBNP in the last 8760 hours. HbA1C: No results for input(s): HGBA1C in the last 72 hours. CBG: No results for input(s): GLUCAP in the last 168 hours. Lipid Profile: No results for input(s): CHOL, HDL, LDLCALC, TRIG, CHOLHDL, LDLDIRECT in the last 72 hours. Thyroid Function Tests: No results for input(s): TSH, T4TOTAL, FREET4, T3FREE, THYROIDAB in the last 72 hours. Anemia Panel: No results for input(s): VITAMINB12, FOLATE, FERRITIN, TIBC, IRON, RETICCTPCT in the last 72 hours. Urine analysis:    Component Value Date/Time   COLORURINE YELLOW 03/04/2017 1645   APPEARANCEUR HAZY (A) 03/04/2017 1645   LABSPEC 1.028 03/04/2017 1645   PHURINE 5.0 03/04/2017 1645   GLUCOSEU NEGATIVE 03/04/2017 1645   HGBUR NEGATIVE 03/04/2017 1645   BILIRUBINUR NEGATIVE 03/04/2017 1645   KETONESUR NEGATIVE 03/04/2017 1645   PROTEINUR NEGATIVE 03/04/2017 1645   NITRITE NEGATIVE 03/04/2017 1645   LEUKOCYTESUR NEGATIVE 03/04/2017 1645    Radiological Exams on Admission: Ct Head Code Stroke Wo Contrast  Result Date: 09/21/2019 CLINICAL DATA:  Code stroke.  Left-sided weakness.  Slurred speech. EXAM: CT HEAD WITHOUT CONTRAST TECHNIQUE: Contiguous axial images were obtained from the base of the skull through the vertex without intravenous contrast. COMPARISON:  05/06/2019 FINDINGS: Brain: Chronic ischemic changes are seen affecting the pons. 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 Cerebral hemispheres elsewhere show old small  vessel infarctions of the basal ganglia and chronic confluent small-vessel change of the white matter. No acute cortical or large vessel territory infarction is visible. No mass, hemorrhage, hydrocephalus or extra-axial collection. Vascular: There is atherosclerotic calcification of the major vessels at the base of the brain. 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 Skull: Negative Sinuses/Orbits: Clear/normal Other: None ASPECTS ([place] Stroke Program Early CT Score) - Ganglionic level infarction (caudate, lentiform nuclei, internal capsule, insula, M1-M3 cortex): 7 - Supraganglionic infarction (M4-M6 cortex): 3 Total score (0-10 with 10 being normal): 10 IMPRESSION: 1. Extensive chronic ischemic changes throughout the brain as outlined above. No sign of acute infarction by CT. 2. 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 3. ASPECTS is 10. 4. The case was discussed with Dr. Cheral Marker at 1910 hours while the examination was still preliminary. Electronically Signed   By: Nelson Chimes M.D.   On: 09/21/2019 19:21    EKG: Independently reviewed.  Sinus rhythm with nonspecific T wave inversions in the lateral leads  Assessment/Plan  Left-sided hemineglect -Patient presented as a code stroke and was evaluated by neurology -Recommend MRI and MRA of the brain -will obtain Carotid Doppler -will obtain echocardiogram -PT/OT/SLT -Start daily aspirin 325 mg -Allow for permissive hypertension unless greater than 180 per neurology recommendation.  Will give IV labetalol as needed for systolic greater than 735 and diastolic greater than 329. -Monitor on telemetry.  Frequent neuro checks.  Hypertension -Hold home amlodipine.  Allow for permissive hypertension per neurology evaluation.  Late stage  dementia -Stable  Malnutrition - Protein calorie deficient -  Nutrition consult   DVT prophylaxis:.Lovenox Code Status:DNR Family Communication: Plan discussed with son at bedside.  He would like daily updates.  Edward-[phone] Also can reach his wife, Katharine Look, if he is unavailable [phone]  disposition Plan: Home with at least 2 midnight stays  Consults called: Neurology Admission status: inpatient   Dezirea Mccollister T Temprence Rhines DO [facility]   If 7PM-7AM, please contact night-coverage [email or web address] Password [email or web address]  09/21/2019, 10:20 PM

## 2019-09-21 NOTE — ED Provider Notes (Signed)
[facility] EMERGENCY DEPARTMENT Provider Note   CSN: [id number] Arrival date & time: 09/21/19  1850  An emergency department physician performed an initial assessment on this suspected stroke patient at 52.  History   Chief Complaint Chief Complaint  Patient presents with  . Code Stroke    HPI Pamela Choi is a 85 y.o. female.     85 yo F with a cc of L sided neglect, weakness and R sided facial droop.  This was noted at the nursing home while she was eating.  She was then sent here by EMS.  Code stroke was initiated and she went immediately back to CT.  Level 5 caveat acuity of condition.  Family feel that things are getting somewhat better on my assessment.  Patient with no focal complaint.  The history is provided by the patient, the EMS personnel and a relative.  Illness Severity:  Severe Onset quality:  Sudden Duration:  2 hours Timing:  Constant Progression:  Unchanged Chronicity:  New Associated symptoms: no chest pain, no congestion, no fever, no headaches, no myalgias, no nausea, no rhinorrhea, no shortness of breath, no vomiting and no wheezing     Past Medical History:  Diagnosis Date  . Acute pericarditis 07/20/2015  . Allergy   . ANKLE SPRAIN, RIGHT 05/10/2007   Qualifier: Diagnosis of  By: Norma Fredrickson MD, Larena Glassman    . Anxiety and depression   . 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 Chicken pox   . Hyperlipidemia   . Hypertension   . Mild dementia ([place])    eval with neurology in 2014, mod SVD, declined tx  . Osteopenia   . Pericardial effusion    a. 06/2015 Echo: EF 55%, mild LVH, mild AI, mild dil of Asc Ao (65m, 328m@ root), nl RV fxn, mild TR, small to mod circumferential pericardial effusion.  . Pericarditis    a. 06/2015  . Plantar wart of right  foot   . TOBACCO ABUSE, HX OF 11/08/2006   Qualifier: Diagnosis of  By: SiLinden DolinD, AaMarjory Lies    Patient Active Problem List   Diagnosis Date Noted  . Syncope and collapse 02/28/2017  . Recurrent major depressive disorder, in full remission ([facility]01/03/2017  . Memory loss 02/22/2013  . Hyperlipemia 01/11/2007  . Essential hypertension 11/08/2006    Past Surgical History:  Procedure Laterality Date  . BREAST SURGERY    . BUNIONECTOMY     R foot  . HAMMER TOE SURGERY     Right foot     OB History   No obstetric history on file.      Home Medications    Prior to Admission medications   Medication Sig Start Date End Date Taking? Authorizing Provider  acetaminophen (TYLENOL) 325 MG tablet Take 650 mg by mouth every 8 (eight) hours.   Yes [provider]  amLODipine (NORVASC) 2.5 MG tablet Take 5 mg by mouth daily. HOLD IF SYSTOLIC NUMBER IS <1<094 Yes [provider]  barrier cream (NON-SPECIFIED) CREA Apply 1 application topically 3 (three) times daily.   Yes [provider]  bisacodyl (DULCOLAX) 10 MG suppository Place 10 mg rectally every evening.   Yes [provider]  calcium-vitamin D (OYSTER SHELL CALCIUM/D3) 500-400 MG-UNIT tablet Take 1 tablet by mouth  daily.   Yes [provider]  Cholecalciferol (VITAMIN D-3) 25 MCG (1000 UT) CAPS Take 3,000 Units by mouth daily.   Yes [provider]  escitalopram (LEXAPRO) 5 MG tablet Take 1 tablet (5 mg total) by mouth daily. Patient taking differently: Take 10 mg by mouth daily.  12/30/16  Yes Colin Benton R, DO  famotidine (PEPCID) 20 MG tablet Take 20 mg by mouth 2 (two) times daily.   Yes [provider]  lactulose (CHRONULAC) 10 GM/15ML solution Take 30 g by mouth daily.    Yes [provider]  levETIRAcetam (KEPPRA) 250 MG tablet Take 125 mg by mouth 2 (two) times daily.   Yes [provider]  levothyroxine (SYNTHROID) 25 MCG tablet Take 37.5 mcg  by mouth daily before breakfast.   Yes [provider]  loratadine (CLARITIN) 10 MG tablet Take 10 mg by mouth daily.   Yes [provider]  metoCLOPramide (REGLAN) 5 MG tablet Take 2.5 mg by mouth 3 (three) times daily with meals.   Yes [provider]  montelukast (SINGULAIR) 10 MG tablet Take 10 mg by mouth at bedtime.   Yes [provider]  NON FORMULARY See admin instructions. Magic Cup: Drink 1 cup by mouth three times a day   Yes [provider]  NON FORMULARY Take 120 mLs by mouth See admin instructions. MedPass: Drink 120 ml's by mouth three times a day   Yes [provider]  Nutritional Supplements (FEEDING SUPPLEMENT, BOOST BREEZE,) LIQD Take 237 mLs by mouth 2 (two) times daily.   Yes [provider]  pantoprazole (PROTONIX) 40 MG tablet Take 40 mg by mouth daily before breakfast.   Yes [provider]  polyethylene glycol (MIRALAX / GLYCOLAX) packet Take 17 g by mouth 2 (two) times daily. MIXED INTO 8 OUNCES OF FLUID   Yes [provider]  pravastatin (PRAVACHOL) 40 MG tablet TAKE 1 TABLET BY MOUTH EVERY DAY Patient taking differently: Take 40 mg by mouth at bedtime.  01/31/17  Yes Colin Benton R, DO  sennosides-docusate sodium (SENOKOT-S) 8.6-50 MG tablet Take 2 tablets by mouth 2 (two) times daily.   Yes [provider]  aspirin EC 81 MG tablet Take 81 mg by mouth daily.    [provider]  cetirizine (ZYRTEC) 10 MG tablet Take 1 tablet (10 mg total) by mouth daily. Patient not taking: Reported on 09/21/2019 08/14/12   Pedro Earls, MD  divalproex (DEPAKOTE SPRINKLE) 125 MG capsule Take 125-250 mg by mouth 2 (two) times daily. 250 MG in the morning, 500 MG at 1 PM and 125 MG in the evening 02/11/18   [provider]  donepezil (ARICEPT) 10 MG tablet TAKE 1 TABLET(10 MG) BY MOUTH AT BEDTIME Patient not taking: Reported on 09/21/2019 10/27/16   Kathrynn Ducking, MD  LORazepam (ATIVAN)  0.5 MG tablet Take 0.75 mg by mouth at bedtime.    [provider]  Melatonin 1 MG TABS Take 2 mg by mouth at bedtime.    [provider]  oxyCODONE (OXY IR/ROXICODONE) 5 MG immediate release tablet Take 1 tablet (5 mg total) by mouth every 4 (four) hours as needed for moderate pain. Patient not taking: Reported on 09/21/2019 03/02/17   Caren Griffins, MD  warfarin (COUMADIN) 2 MG tablet Take 2 mg by mouth daily.    [provider]    Family History Family History  Problem Relation Age of Onset  . Diabetes Mother   .  Hypertension Mother   . Cancer Brother   . Heart attack Brother   . Stroke Brother   . Stroke Brother     Social History Social History   Tobacco Use  . Smoking status: Former Smoker    Quit date: 08/02/2003    Years since quitting: 16.1  . Smokeless tobacco: Never Used  Substance Use Topics  . Alcohol use: No  . Drug use: No     Allergies   Bee venom   Review of Systems Review of Systems  Constitutional: Negative for chills and fever.  HENT: Negative for congestion and rhinorrhea.   Eyes: Negative for redness and visual disturbance.  Respiratory: Negative for shortness of breath and wheezing.   Cardiovascular: Negative for chest pain and palpitations.  Gastrointestinal: Negative for nausea and vomiting.  Genitourinary: Negative for dysuria and urgency.  Musculoskeletal: Negative for arthralgias and myalgias.  Skin: Negative for pallor and wound.  Neurological: Positive for facial asymmetry and weakness. Negative for dizziness and headaches.     Physical Exam Updated Vital Signs BP 126/85   Pulse 83   Temp 97.7 F (36.5 C) (Oral)   Resp 20   SpO2 93%   Physical Exam Vitals signs and nursing note reviewed.  Constitutional:      General: She is not in acute distress.    Appearance: She is well-developed. She is not diaphoretic.     Comments: Chronically ill-appearing  HENT:     Head: Normocephalic and atraumatic.   Eyes:     Pupils: Pupils are equal, round, and reactive to light.  Neck:     Musculoskeletal: Normal range of motion and neck supple.  Cardiovascular:     Rate and Rhythm: Normal rate and regular rhythm.     Heart sounds: No murmur. No friction rub. No gallop.   Pulmonary:     Effort: Pulmonary effort is normal.     Breath sounds: No wheezing or rales.  Abdominal:     General: There is no distension.     Palpations: Abdomen is soft.     Tenderness: There is no abdominal tenderness.  Musculoskeletal:        General: No tenderness.  Skin:    General: Skin is warm and dry.  Neurological:     Mental Status: She is alert and oriented to person, place, and time.     Comments: Left-sided weakness and left-sided neglect  Psychiatric:        Behavior: Behavior normal.      ED Treatments / Results  Labs (all labs ordered are listed, but only abnormal results are displayed) Labs Reviewed  CBC - Abnormal; Notable for the following components:      Result Value   MCV 103.5 (*)    All other components within normal limits  COMPREHENSIVE METABOLIC PANEL - Abnormal; Notable for the following components:   Glucose, Bld 108 (*)    BUN 24 (*)    Creatinine, Ser 1.01 (*)    Total Bilirubin 0.2 (*)    GFR calc non Af Amer 50 (*)    GFR calc Af Amer 58 (*)    All other components within normal limits  I-STAT CHEM 8, ED - Abnormal; Notable for the following components:   BUN 26 (*)    Glucose, Bld 101 (*)    Calcium, Ion 1.12 (*)    All other components within normal limits  SARS CORONAVIRUS 2 (TAT 6-24 HRS)  PROTIME-INR  APTT  DIFFERENTIAL  CBG MONITORING, ED    EKG None  Radiology Ct Head Code Stroke Wo Contrast  Result Date: 09/21/2019 CLINICAL DATA:  Code stroke.  Left-sided weakness.  Slurred speech. EXAM: CT HEAD WITHOUT CONTRAST TECHNIQUE: Contiguous axial images were obtained from the base of the skull through the vertex without intravenous contrast. COMPARISON:   05/06/2019 FINDINGS: Brain: Chronic ischemic changes are seen affecting the pons. 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 Cerebral hemispheres elsewhere show old small vessel infarctions of the basal ganglia and chronic confluent small-vessel change of the white matter. No acute cortical or large vessel territory infarction is visible. No mass, hemorrhage, hydrocephalus or extra-axial collection. Vascular: There is atherosclerotic calcification of the major vessels at the base of the brain. 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 Skull: Negative Sinuses/Orbits: Clear/normal Other: None ASPECTS ([place] Stroke Program Early CT Score) - Ganglionic level infarction (caudate, lentiform nuclei, internal capsule, insula, M1-M3 cortex): 7 - Supraganglionic infarction (M4-M6 cortex): 3 Total score (0-10 with 10 being normal): 10 IMPRESSION: 1. Extensive chronic ischemic changes throughout the brain as outlined above. No sign of acute infarction by CT. 2. 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 3. ASPECTS is 10. 4. The case was discussed with Dr. Cheral Marker at 1910 hours while the examination was still preliminary. Electronically Signed   By: Nelson Chimes M.D.   On: 09/21/2019 19:21    Procedures Procedures (including critical care time)  Medications Ordered in ED Medications  sodium chloride flush (NS) 0.9 % injection 3 mL (has no administration in time range)     Initial Impression / Assessment and Plan / ED Course  I have reviewed the triage vital signs and the nursing notes.  Pertinent labs & imaging results that were available during my care of the patient were reviewed by me  and considered in my medical decision making (see chart for details).        85 yo F with a chief complaints of right-sided facial droop and left-sided weakness.  This was noted by the nursing staff earlier today.  She arrived here as a code stroke.  She had a vascular abnormality that precluded her from getting TPA or intervention.  Neurology is seen her and recommended inpatient stroke work-up.  Will discuss with the hospitalist.  The patients results and plan were reviewed and discussed.   Any x-rays performed were independently reviewed by myself.   Differential diagnosis were considered with the presenting HPI.  Medications  sodium chloride flush (NS) 0.9 % injection 3 mL (has no administration in time range)    Vitals:   09/21/19 1928 09/21/19 1929 09/21/19 2015 09/21/19 2045  BP:  (!) 164/103 (!) 162/94 126/85  Pulse:  79 84 83  Resp:  16 (!) 21 20  Temp:  97.7 F (36.5 C)    TempSrc:  Oral    SpO2: 100% 100% 94% 93%    Final diagnoses:  Acute ischemic right MCA stroke (HCC)    Admission/ observation were discussed with the admitting physician, patient and/or family and they are comfortable with the plan.    Final Clinical Impressions(s) / ED Diagnoses   Final diagnoses:  Acute ischemic  right MCA stroke [facility])    ED Discharge Orders    None       Deno Etienne, [place] 09/21/19 2127

## 2019-09-22 ENCOUNTER — Inpatient Hospital Stay (HOSPITAL_COMMUNITY): Payer: Medicare Other

## 2019-09-22 ENCOUNTER — Other Ambulatory Visit (HOSPITAL_COMMUNITY): Payer: BC Managed Care – PPO

## 2019-09-22 DIAGNOSIS — I63511 Cerebral infarction due to unspecified occlusion or stenosis of right middle cerebral artery: Secondary | ICD-10-CM

## 2019-09-22 LAB — CREATININE, SERUM
Creatinine, Ser: 0.85 mg/dL (ref 0.44–1.00)
GFR calc Af Amer: >60 mL/min (ref >60)
GFR calc non Af Amer: >60 mL/min (ref >60)

## 2019-09-22 LAB — LIPID PANEL
Cholesterol: 176 mg/dL (ref 0–200)
HDL: 47 mg/dL (ref >40)
LDL Cholesterol: 116 mg/dL — ABNORMAL HIGH (ref 0–99)
Total CHOL/HDL Ratio: 3.7 RATIO
Triglycerides: 66 mg/dL (ref <150)
VLDL: 13 mg/dL (ref 0–40)

## 2019-09-22 LAB — CBG MONITORING, ED: Glucose-Capillary: 101 mg/dL — ABNORMAL HIGH (ref 70–99)

## 2019-09-22 LAB — GLUCOSE, CAPILLARY
Glucose-Capillary: 119 mg/dL — ABNORMAL HIGH (ref 70–99)
Glucose-Capillary: 94 mg/dL (ref 70–99)

## 2019-09-22 LAB — CBC
HCT: 40.1 % (ref 36.0–46.0)
Hemoglobin: 13.3 g/dL (ref 12.0–15.0)
MCH: 33.9 pg (ref 26.0–34.0)
MCHC: 33.2 g/dL (ref 30.0–36.0)
MCV: 102.3 fL — ABNORMAL HIGH (ref 80.0–100.0)
Platelets: 217 10*3/uL (ref 150–400)
RBC: 3.92 MIL/uL (ref 3.87–5.11)
RDW: 12.2 % (ref 11.5–15.5)
WBC: 8.1 10*3/uL (ref 4.0–10.5)
nRBC: 0 % (ref 0.0–0.2)

## 2019-09-22 LAB — SARS CORONAVIRUS 2 (TAT 6-24 HRS): SARS Coronavirus 2: NEGATIVE

## 2019-09-22 LAB — HEMOGLOBIN A1C
Hgb A1c MFr Bld: 5.3 % (ref 4.8–5.6)
Mean Plasma Glucose: 105.41 mg/dL

## 2019-09-22 LAB — MRSA PCR SCREENING: MRSA by PCR: NEGATIVE

## 2019-09-22 MED ORDER — ASPIRIN 300 MG RE SUPP
300.0000 mg | Freq: Every day | RECTAL | Status: DC
  Administered 2019-09-22 – 2019-09-26 (×5): 300 mg via RECTAL
  Filled 2019-09-22 (×5): qty 1

## 2019-09-22 MED ORDER — DEXTROSE IN LACTATED RINGERS 5 % IV SOLN
INTRAVENOUS | Status: DC
  Administered 2019-09-22 – 2019-09-26 (×4): via INTRAVENOUS

## 2019-09-22 MED ORDER — ATORVASTATIN CALCIUM 40 MG PO TABS
40.0000 mg | ORAL_TABLET | Freq: Every day | ORAL | Status: DC
  Administered 2019-09-23 – 2019-09-25 (×3): 40 mg via ORAL
  Filled 2019-09-22 (×3): qty 1

## 2019-09-22 NOTE — Progress Notes (Addendum)
PROGRESS NOTE  Pamela Choi [id number] DOB: 12-24-1930 DOA: 09/21/2019 PCP: Lucretia Kern, DO  HPI/Recap of past 24 hours: Pamela Choi is a 85 y.o. female with medical history significant of dementia, hypertension, hyperlipidemia, depression and anxiety who presented as a CODE STROKE with left-sided neglect, left-sided facial drooping and drooling.  Patient reportedly was found by nursing staff at 1700 today with slurred speech, drooping and left-sided weakness.  Patient has severe dementia and incoherent speech at baseline so son at bedside was able to provide some history.  He states that patient has been in the nursing home for 3 years ever since fall and has progressively had worsening memory issues.  She is mostly non-verbal and sometimes able to answer questions although speech is mostly incoherent.  She requires assistance with all activities of daily living at nursing facility including feeding.  Patient was able to follow some simple commands but incoherent when attempting to answer questions.  ED Course: She was afebrile and mildly hypertensive with a systolic [id number] over [id number].  CBC showed no leukocytosis or anemia.  BMP showed mildly elevated creatinine of 1.01 which is around her baseline.  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  She was evaluated by neurologist Dr. Cheral Marker and she was deemed not a candidate for TPA secondary to high risk of aneurysmal rupture.  Also not a candidate for thrombectomy due to pre-existing disability requiring 24-hour care.  09/22/19; patient was seen and examined at her bedside in the ED.  She is somnolent and difficult to arouse with voice and light movement.  Unable to obtain a history from her due to somnolence.  VS on the monitor in room unremarkable.  Assessment/Plan: Active Problems:    Hemineglect  Acute ischemic nonhemorrhagic infarct involving the right basal ganglia with left hemiplegia Per MRI Presented as a code stroke; TPA not given due to a large calcified right MCA fusiform aneurysm noted precluding TPA administration due to high risk of aneurysmal rupture. PT OT, speech therapy, Aspirin 325, Lipitor, LDL 110 A1c is 5.3 Twelve-lead EKG on admission sinus rhythm Seen by neurology, following 2D echo, carotid Doppler ultrasound pending Ongoing permissive hypertension Neurochecks every 4 hours  Hypertension Ongoing permissive hypertension Continue to hold off amlodipine  Late stage dementia Stable Reorient as necessary  Seizure disorder Resume home medications when able to swallow safely  Hyperlipidemia Resume pravastatin when passes swallow evaluation  Hypothyroidism Resume levothyroxine when passes swallow evaluation  Chronic anxiety/depression Resume Lexapro when able to swallow safely   Medication reconciliation Unclear why patient is on Coumadin Will call SNF facility  Disposition plan: Patient is currently not appropriate for discharge due to persistent symptomatology requiring further evaluation.  Patient requires at least 2 midnights for further evaluation and treatment of present condition.  DVT prophylaxis:.Lovenox Code Status:DNR Family Communication: Plan discussed with son at bedside.  He would like daily updates.  Edward-[phone] Also can reach his wife, Katharine Look, if he is unavailable [phone]  disposition Plan: Home with at least 2 midnight stays  Consults called: Neurology Admission status: inpatient      Objective: Vitals:   09/22/19 0800 09/22/19 0815 09/22/19 0830 09/22/19 0845  BP: (!) 141/86 (!) 146/100 138/81 124/89  Pulse: 77 82 79 79  Resp: 16 18 10 19   Temp:      TempSrc:      SpO2:  96% 95% 97% 97%   No intake or output data in the 24 hours ending 09/22/19 0904 There were no vitals filed for  this visit.  Exam:   General: 85 y.o. year-old female frail-appearing somnolent.    Cardiovascular: Regular rate and rhythm with no rubs or gallops.  No thyromegaly or JVD noted.    Respiratory: Clear to auscultation with no wheezes or rales. Good inspiratory effort.  Abdomen: Soft nontender nondistended with normal bowel sounds x4 quadrants.  Musculoskeletal: No lower extremity edema. 2/4 pulses in all 4 extremities.  Psychiatry: Mood is appropriate for condition and setting   Data Reviewed: CBC: Recent Labs  Lab 09/21/19 1853 09/21/19 1857 09/22/19 0054  WBC 6.4  --  8.1  NEUTROABS 3.8  --   --   HGB 13.3 13.9 13.3  HCT 40.9 41.0 40.1  MCV 103.5*  --  102.3*  PLT 231  --  [id number]   Basic Metabolic Panel: Recent Labs  Lab 09/21/19 1853 09/21/19 1857 09/22/19 0054  NA 141 142  --   K 3.8 3.7  --   CL 107 107  --   CO2 25  --   --   GLUCOSE 108* 101*  --   BUN 24* 26*  --   CREATININE 1.01* 0.80 0.85  CALCIUM 9.4  --   --    GFR: CrCl cannot be calculated (Unknown ideal weight.). Liver Function Tests: Recent Labs  Lab 09/21/19 1853  AST 18  ALT 10  ALKPHOS 98  BILITOT 0.2*  PROT 7.1  ALBUMIN 3.5   No results for input(s): LIPASE, AMYLASE in the last 168 hours. No results for input(s): AMMONIA in the last 168 hours. Coagulation Profile: Recent Labs  Lab 09/21/19 1853  INR 1.0   Cardiac Enzymes: No results for input(s): CKTOTAL, CKMB, CKMBINDEX, TROPONINI in the last 168 hours. BNP (last 3 results) No results for input(s): PROBNP in the last 8760 hours. HbA1C: Recent Labs    09/22/19 0354  HGBA1C 5.3   CBG: No results for input(s): GLUCAP in the last 168 hours. Lipid Profile: Recent Labs    09/22/19 0354  CHOL 176  HDL 47  LDLCALC 116*  TRIG 66  CHOLHDL 3.7   Thyroid Function Tests: No results for input(s): TSH, T4TOTAL, FREET4, T3FREE, THYROIDAB in the last 72 hours. Anemia Panel: No results for input(s): VITAMINB12, FOLATE,  FERRITIN, TIBC, IRON, RETICCTPCT in the last 72 hours. Urine analysis:    Component Value Date/Time   COLORURINE YELLOW 03/04/2017 1645   APPEARANCEUR HAZY (A) 03/04/2017 1645   LABSPEC 1.028 03/04/2017 1645   PHURINE 5.0 03/04/2017 1645   GLUCOSEU NEGATIVE 03/04/2017 1645   HGBUR NEGATIVE 03/04/2017 1645   BILIRUBINUR NEGATIVE 03/04/2017 1645   KETONESUR NEGATIVE 03/04/2017 1645   PROTEINUR NEGATIVE 03/04/2017 1645   NITRITE NEGATIVE 03/04/2017 1645   LEUKOCYTESUR NEGATIVE 03/04/2017 1645   Sepsis Labs: @LABRCNTIP (procalcitonin:4,lacticidven:4)  ) Recent Results (from the past 240 hour(s))  SARS CORONAVIRUS 2 (TAT 6-24 HRS) Nasopharyngeal Nasopharyngeal Swab     Status: None   Collection Time: 09/21/19  9:50 PM   Specimen: Nasopharyngeal Swab  Result Value Ref Range Status   SARS Coronavirus 2 NEGATIVE NEGATIVE Final    Comment: (NOTE) SARS-CoV-2 target nucleic acids are NOT DETECTED. The SARS-CoV-2 RNA is generally detectable in upper and lower respiratory specimens during the acute phase of infection. Negative results do not preclude SARS-CoV-2 infection, do not rule out co-infections with other pathogens, and should not be  used as the sole basis for treatment or other patient management decisions. Negative results must be combined with clinical observations, patient history, and epidemiological information. The expected result is Negative. Fact Sheet for Patients: SugarRoll.be Fact Sheet for Healthcare Providers: https://www.woods-mathews.com/ This test is not yet approved or cleared by the [place] FDA and  has been authorized for detection and/or diagnosis of SARS-CoV-2 by FDA under an Emergency Use Authorization (EUA). This EUA will remain  in effect (meaning this test can be used) for the duration of the COVID-19 declaration under Section 56 4(b)(1) of the Act, 21 U.S.C. section 360bbb-3(b)(1), unless the authorization  is terminated or revoked sooner. Performed at [facility], [place] [address], [place], [place] [address]       Studies: Dg Skull 1-3 Views  Result Date: 09/22/2019 CLINICAL DATA:  Pre MRI clearance EXAM: SKULL - 1-3 VIEW COMPARISON:  CT head same day FINDINGS: Tiny metallic density projects over the posterior midline scalp, external to the patient on same day CT of the head. No other metallic foreign body is identified. There is no evidence of skull fracture or other focal bone lesions. The maxillary dentition is absent. Multiple mandibular teeth are also absent with mild mandibular prognathism. Mild degenerative cervical spondylitic changes are seen. IMPRESSION: Tiny linear metallic density projects over the posterior midline scalp, external to the patient on cross-sectional imaging., likely hair-tie, correlate with visual inspection and remove prior to MR scanning. No other metallic foreign body. Electronically Signed   By: Lovena Le M.D.   On: 09/22/2019 01:29   Dg Chest 1 View  Result Date: 09/22/2019 CLINICAL DATA:  Initial evaluation for MRI clearance, stroke. EXAM: CHEST  1 VIEW COMPARISON:  Prior radiograph from 03/04/2017. FINDINGS: Cardiac and mediastinal silhouettes are stable, and remain within normal limits. Lungs hypoinflated with elevation of the right hemidiaphragm. Mild bibasilar atelectasis. No focal infiltrate, edema, or effusion. No pneumothorax. No acute osseous finding. No radiopaque foreign body or metallic implant. Multiple scattered prominent gas-filled loops of bowel noted within the visualized upper abdomen. IMPRESSION: 1. No radiopaque foreign body or metallic implant. 2. Low lung volumes with associated mild bibasilar atelectasis. No other active cardiopulmonary disease. Electronically Signed   By: Jeannine Boga M.D.   On: 09/22/2019 02:22   Dg Abdomen 1 View  Result Date: 09/22/2019 CLINICAL DATA:  Pre MRI EXAM: ABDOMEN - 1 VIEW COMPARISON:  None.  FINDINGS: No metallic foreign body or hardware is evident. There is diffuse gaseous distention of the bowel particularly the colon. Large volume of fecal material projects over the rectal vault. Phleboliths are present in the pelvis. Osseous structures are unremarkable aside from degenerative changes in the lower lumbar spine and hips. Cardiac monitor leads overlie the chest. IMPRESSION: 1. No metallic foreign body or hardware is evident. 2. Diffuse gaseous distention of the bowel particularly the colon, consistent with ileus. Electronically Signed   By: Lovena Le M.D.   On: 09/22/2019 01:26   Mr Angio Head Wo Contrast  Result Date: 09/22/2019 CLINICAL DATA:  Initial evaluation for acute stroke. EXAM: MRI HEAD WITHOUT CONTRAST MRA HEAD WITHOUT CONTRAST TECHNIQUE: Multiplanar, multiecho pulse sequences of the brain and surrounding structures were obtained without intravenous contrast. Angiographic images of the head were obtained using MRA technique without contrast. COMPARISON:  Prior CT from 09/21/2019. FINDINGS: MRI HEAD FINDINGS Brain: Advanced age-related cerebral atrophy, most notable at the anterior temporal lobes bilaterally. Patchy and confluent T2/FLAIR hyperintensity within the periventricular deep white matter both  cerebral hemispheres most consistent with chronic small vessel ischemic disease, also fairly advanced. Multiple remote lacunar infarcts noted within the bilateral thalami and pons. Multiple scatter remote bilateral cerebellar infarcts. 18 mm acute ischemic perforator type infarct seen extending from the right lentiform nucleus to the right caudate. Additional 8 mm linear acute ischemic infarct noted involving the cortical gray matter of the right occipital lobe (series 13, image 66). No associated hemorrhage or mass effect. No other evidence for acute or subacute ischemia. Gray-white matter differentiation otherwise maintained. No evidence for acute or chronic intracranial hemorrhage. No  mass lesion, midline shift or mass effect. No hydrocephalus. No extra-axial fluid collection. Incidental note made of an empty sella. Vascular: Major intracranial vascular flow voids maintained at the skull base. Skull and upper cervical spine: Craniocervical junction within normal limits. Bone marrow signal intensity normal. No scalp soft tissue abnormality. Sinuses/Orbits: Patient status post ocular lens replacement on the right. Mild scattered mucosal thickening noted within the ethmoidal air cells and maxillary sinuses. No mastoid effusion. Other: None. MRA HEAD FINDINGS ANTERIOR CIRCULATION: Visualized distal cervical segments of the internal carotid arteries are patent with antegrade flow. Both internal carotid arteries patent to the termini without flow-limiting stenosis. ICAs are both somewhat ectatic through the siphon region. 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 A1 segments patent bilaterally. Negative anterior communicating artery complex. Anterior cerebral arteries patent to their distal aspects without stenosis. M1 segments remain patent without significant stenosis. Normal MCA bifurcations. Distal MCA branches well perfused and symmetric. POSTERIOR CIRCULATION: Right vertebral artery slightly dominant and tortuous, crossing the midline prior to the vertebrobasilar junction. Left vertebral artery slightly diminutive. Vertebral arteries remain widely patent to the vertebrobasilar junction. PICA origins not well seen. Basilar diminutive but remains patent to its distal aspect without flow-limiting stenosis. Superior cerebral arteries patent bilaterally. Fetal type origin of the right PCA. Left PCA supplied via a hypoplastic left P1 as well as a robust left posterior communicating artery. PCAs perfused to their distal aspects without stenosis. Diffuse small vessel atheromatous irregularity noted throughout the intracranial circulation. IMPRESSION: MRI  HEAD IMPRESSION: 1. 18 mm acute ischemic nonhemorrhagic perforator type infarct involving the right basal ganglia. 2. Additional 8 mm acute ischemic nonhemorrhagic right occipital cortical infarct. 3. Underlying advanced age-related cerebral atrophy with chronic small vessel ischemic disease in extensive chronic ischemic changes as above. MRA HEAD IMPRESSION: 1. Stable intracranial MRA as compared to 2018. 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 2. Diffuse small vessel atheromatous irregularity throughout the intracranial circulation. No proximal high-grade or correctable stenosis. Electronically Signed   By: Jeannine Boga M.D.   On: 09/22/2019 03:30   Mr Brain Wo Contrast  Result Date: 09/22/2019 CLINICAL DATA:  Initial evaluation for acute stroke. EXAM: MRI HEAD WITHOUT CONTRAST MRA HEAD WITHOUT CONTRAST TECHNIQUE: Multiplanar, multiecho pulse sequences of the brain and surrounding structures were obtained without intravenous contrast. Angiographic images of the head were obtained using MRA technique without contrast. COMPARISON:  Prior CT from 09/21/2019. FINDINGS: MRI HEAD FINDINGS Brain: Advanced age-related cerebral atrophy, most notable at the anterior temporal lobes bilaterally. Patchy and confluent T2/FLAIR hyperintensity within the periventricular deep white matter both cerebral hemispheres most consistent with chronic small vessel ischemic disease, also fairly advanced. Multiple remote lacunar infarcts noted within the bilateral thalami and pons. Multiple scatter remote bilateral cerebellar infarcts. 18 mm acute ischemic perforator type infarct seen extending from the right lentiform nucleus to the right caudate.  Additional 8 mm linear acute ischemic infarct noted involving the cortical gray matter of the right occipital lobe (series 13, image 66). No associated hemorrhage or mass effect. No other evidence for acute or subacute ischemia. Gray-white matter  differentiation otherwise maintained. No evidence for acute or chronic intracranial hemorrhage. No mass lesion, midline shift or mass effect. No hydrocephalus. No extra-axial fluid collection. Incidental note made of an empty sella. Vascular: Major intracranial vascular flow voids maintained at the skull base. Skull and upper cervical spine: Craniocervical junction within normal limits. Bone marrow signal intensity normal. No scalp soft tissue abnormality. Sinuses/Orbits: Patient status post ocular lens replacement on the right. Mild scattered mucosal thickening noted within the ethmoidal air cells and maxillary sinuses. No mastoid effusion. Other: None. MRA HEAD FINDINGS ANTERIOR CIRCULATION: Visualized distal cervical segments of the internal carotid arteries are patent with antegrade flow. Both internal carotid arteries patent to the termini without flow-limiting stenosis. ICAs are both somewhat ectatic through the siphon region. 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 A1 segments patent bilaterally. Negative anterior communicating artery complex. Anterior cerebral arteries patent to their distal aspects without stenosis. M1 segments remain patent without significant stenosis. Normal MCA bifurcations. Distal MCA branches well perfused and symmetric. POSTERIOR CIRCULATION: Right vertebral artery slightly dominant and tortuous, crossing the midline prior to the vertebrobasilar junction. Left vertebral artery slightly diminutive. Vertebral arteries remain widely patent to the vertebrobasilar junction. PICA origins not well seen. Basilar diminutive but remains patent to its distal aspect without flow-limiting stenosis. Superior cerebral arteries patent bilaterally. Fetal type origin of the right PCA. Left PCA supplied via a hypoplastic left P1 as well as a robust left posterior communicating artery. PCAs perfused to their distal aspects without stenosis. Diffuse  small vessel atheromatous irregularity noted throughout the intracranial circulation. IMPRESSION: MRI HEAD IMPRESSION: 1. 18 mm acute ischemic nonhemorrhagic perforator type infarct involving the right basal ganglia. 2. Additional 8 mm acute ischemic nonhemorrhagic right occipital cortical infarct. 3. Underlying advanced age-related cerebral atrophy with chronic small vessel ischemic disease in extensive chronic ischemic changes as above. MRA HEAD IMPRESSION: 1. Stable intracranial MRA as compared to 2018. 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 2. Diffuse small vessel atheromatous irregularity throughout the intracranial circulation. No proximal high-grade or correctable stenosis. Electronically Signed   By: Jeannine Boga M.D.   On: 09/22/2019 03:30   Ct Head Code Stroke Wo Contrast  Result Date: 09/21/2019 CLINICAL DATA:  Code stroke.  Left-sided weakness.  Slurred speech. EXAM: CT HEAD WITHOUT CONTRAST TECHNIQUE: Contiguous axial images were obtained from the base of the skull through the vertex without intravenous contrast. COMPARISON:  05/06/2019 FINDINGS: Brain: Chronic ischemic changes are seen affecting the pons. 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 Cerebral hemispheres elsewhere show old small vessel infarctions of the basal ganglia and chronic confluent small-vessel change of the white matter. No acute cortical or large vessel territory infarction is visible. No mass, hemorrhage, hydrocephalus or extra-axial collection. Vascular: There is atherosclerotic calcification of the major vessels at the base of the brain. 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 Skull: Negative Sinuses/Orbits: Clear/normal Other: None ASPECTS ([place] Stroke Program Early CT Score) - Ganglionic  level infarction (caudate, lentiform nuclei, internal capsule, insula, M1-M3 cortex): 7 - Supraganglionic  infarction (M4-M6 cortex): 3 Total score (0-10 with 10 being normal): 10 IMPRESSION: 1. Extensive chronic ischemic changes throughout the brain as outlined above. No sign of acute infarction by CT. 2. 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 3. ASPECTS is 10. 4. The case was discussed with Dr. Cheral Marker at 1910 hours while the examination was still preliminary. Electronically Signed   By: Nelson Chimes M.D.   On: 09/21/2019 19:21    Scheduled Meds:   stroke: mapping our early stages of recovery book   Does not apply Once   aspirin  325 mg Oral Daily   enoxaparin (LOVENOX) injection  40 mg Subcutaneous Q24H   escitalopram  10 mg Oral Daily   famotidine  20 mg Oral BID   levothyroxine  37.5 mcg Oral QAC breakfast   pravastatin  40 mg Oral QHS   senna-docusate  2 tablet Oral BID   sodium chloride flush  3 mL Intravenous Once    Continuous Infusions:  sodium chloride 50 mL/hr at 09/22/19 0448     LOS: 1 day     Kayleen Memos, MD [facility] Pager [phone]  If 7PM-7AM, please contact night-coverage [email or web address] Password [email or web address] 09/22/2019, 9:04 AM

## 2019-09-22 NOTE — Evaluation (Signed)
Clinical/Bedside Swallow Evaluation Patient Details  Name: Pamela Choi MRN: [id number] Date of Birth: 1930-07-23  Today's Date: 09/22/2019 Time: SLP Start Time (ACUTE ONLY): 1605 SLP Stop Time (ACUTE ONLY): 1625 SLP Time Calculation (min) (ACUTE ONLY): 20 min  Past Medical History:  Past Medical History:  Diagnosis Date  . Acute pericarditis 07/20/2015  . Allergy   . ANKLE SPRAIN, RIGHT 05/10/2007   Qualifier: Diagnosis of  By: Norma Fredrickson MD, Larena Glassman    . Anxiety and depression   . 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 Chicken pox   . Hyperlipidemia   . Hypertension   . Mild dementia ([place])    eval with neurology in 2014, mod SVD, declined tx  . Osteopenia   . Pericardial effusion    a. 06/2015 Echo: EF 55%, mild LVH, mild AI, mild dil of Asc Ao (53m, 351m@ root), nl RV fxn, mild TR, small to mod circumferential pericardial effusion.  . Pericarditis    a. 06/2015  . Plantar wart of right foot   . TOBACCO ABUSE, HX OF 11/08/2006   Qualifier: Diagnosis of  By: SiLinden DolinD, AaMarjory Lies   Past Surgical History:  Past Surgical History:  Procedure Laterality Date  . BREAST SURGERY    . BUNIONECTOMY     R foot  . HAMMER TOE SURGERY     Right foot   HPI:  Patient is an [age].,o. female with PMH: severe dementia, HTN, HLD, depression, anxiety, who presented as code stroke with left sided neglect, facial drooping and drooling. She has been living in a SNF for the past 3 years and per chart review, son had reported her speech was incoherent at baseline and memory issues have been worsening. Premorbidly, she requires assistance with all ADL's including feeding. MRI revealed 18 mm acute ischemic nonhemmorhagic perforator type infarct involving right basal ganglia and 1m80mcute ischemic nonhemorrhagic right occiptal  cortical infarct, advanced age related cerebral atrophy with chronic SVD.t   Assessment / Plan / Recommendation Clinical Impression  Patient presents with a mild oral and moderate pharyngeal phase dysphagia. She exhibited immediate coughing episode with cup sip of thin liquids which appeared to cause discomfort with patient. She exhibited delayed coughing episode after three small cup sips of nectar thick liquids. Puree solids did not result in any overt s/s of aspiration or penetration, however she did exhibit delayed swallow initiation. Based on patient's h/o severe dementia, current basal ganglia CVA and poor tolerance of PO's during this BSE, SLP is recommending to continue with NPO status and to proceed with MBS. SLP Visit Diagnosis: Dysphagia, unspecified (R13.10)    Aspiration Risk  Moderate aspiration risk;Severe aspiration risk    Diet Recommendation NPO   Medication Administration: Via alternative means    Other  Recommendations Oral Care Recommendations: Oral care QID;Staff/trained caregiver to provide oral care   Follow up Recommendations Skilled Nursing facility      Frequency and Duration min 2x/week  1 week       Prognosis Prognosis for Safe Diet Advancement: Good Barriers to Reach Goals: Cognitive deficits;Severity of deficits      Swallow Study   General Date of Onset: 09/21/19 HPI: Patient is an Pamela 69,o. female with PMH: severe dementia, HTN, HLD, depression, anxiety, who presented as code stroke with left sided neglect, facial  drooping and drooling. She has been living in a SNF for the past 3 years and per chart review, son had reported her speech was incoherent at baseline and memory issues have been worsening. Premorbidly, she requires assistance with all ADL's including feeding. MRI revealed 18 mm acute ischemic nonhemmorhagic perforator type infarct involving right basal ganglia and 58m acute ischemic nonhemorrhagic right occiptal cortical infarct, advanced age  related cerebral atrophy with chronic SVD.t Type of Study: Bedside Swallow Evaluation Previous Swallow Assessment: N/A Diet Prior to this Study: NPO Temperature Spikes Noted: No Respiratory Status: Room air History of Recent Intubation: No Behavior/Cognition: Alert;Cooperative;Pleasant mood;Distractible;Requires cueing;Doesn't follow directions;Confused Oral Cavity Assessment: Within Functional Limits Oral Care Completed by SLP: Yes Oral Cavity - Dentition: Missing dentition;Edentulous(edentulous upper, missing dentition lower) Vision: Impaired for self-feeding Self-Feeding Abilities: Total assist;Needs assist;Needs set up Patient Positioning: Upright in bed Baseline Vocal Quality: Low vocal intensity Volitional Cough: Cognitively unable to elicit Volitional Swallow: Unable to elicit    Oral/Motor/Sensory Function     Ice Chips     Thin Liquid Thin Liquid: Impaired Presentation: Cup Pharyngeal  Phase Impairments: Suspected delayed Swallow;Cough - Immediate    Nectar Thick Nectar Thick Liquid: Impaired Presentation: Cup Pharyngeal Phase Impairments: Suspected delayed Swallow;Cough - Delayed   Honey Thick Honey Thick Liquid: Not tested   Puree Puree: Impaired Pharyngeal Phase Impairments: Suspected delayed Swallow   Solid     Solid: Not tested      PDannial Monarch9/26/2020,5:15 PM   JSonia Baller MA, CCC-SLP Speech Therapy [facility]

## 2019-09-22 NOTE — Progress Notes (Signed)
Patient arrived to the unit from the ED alert to self only. Denies Pain Skin clean dry and intact Placed on tele Box #35 Son is at bedside Bed in the lowest position with bed alarm set call light in reach Nurse will continue to monitor

## 2019-09-22 NOTE — Progress Notes (Signed)
PT Cancellation Note  Patient Details Name: Pamela Choi MRN: [id number] DOB: 12-May-1930   Cancelled Treatment:    Reason Eval/Treat Not Completed: Patient declined, no reason specified.  Pt declined PT and did not have a clear reason for why. Will try again at another time.   Ramond Dial 09/22/2019, 3:38 PM   Mee Hives, PT MS Acute Rehab Dept. Number: [place] and [place]

## 2019-09-22 NOTE — ED Notes (Signed)
Pt back from MRI.  Monitoring equipment placed back on pt.  Warm blankets given.

## 2019-09-22 NOTE — Progress Notes (Signed)
VASCULAR LAB PRELIMINARY  PRELIMINARY  PRELIMINARY  PRELIMINARY  Carotid duplex completed.    Preliminary report:  See CV proc for preliminary results.   Kaho Selle, RVT 09/22/2019, 2:29 PM

## 2019-09-22 NOTE — Progress Notes (Signed)
STROKE TEAM PROGRESS NOTE   HISTORY OF PRESENT ILLNESS (per record) Pamela Choi is an 85 y.o. female nursing home resident presenting to the ED with acute onset of left hemiplegia. LKN was at 5 PM, which is when the symptoms were witnessed to have started by nursing home staff. When EMS arrived, the patient continued to be weak.   CT head in the ED revealed no acute hemorrhage or hypodensity. A large calcified right MCA fusiform aneurysm was noted, precluding tPA administration due to high risk of aneurysmal rupture. She was not a candidate for thrombectomy due to pre-existing level of disability requiring 24 hour care and supervision at the SNF.   LSN: 5:00 PM tPA Given: No: A large calcified right MCA fusiform aneurysm was noted, precluding tPA administration due to high risk of aneurysmal rupture.    INTERVAL HISTORY Her son is at bedside. She has dementia. Reviewed images with son, answered questions.    OBJECTIVE Vitals:   09/22/19 1115 09/22/19 1238 09/22/19 1306 09/22/19 1721  BP: (!) 141/87 (!) 145/84  (!) 154/87  Pulse: 83 75  73  Resp: 19 17  17   Temp:  98.5 F (36.9 C)  99 F (37.2 C)  TempSrc:  Oral  Oral  SpO2: 98% 99%  100%  Weight:   57.6 kg   Height:   5\' 5"  (1.651 m)     CBC:  Recent Labs  Lab 09/21/19 1853 09/21/19 1857 09/22/19 0054  WBC 6.4  --  8.1  NEUTROABS 3.8  --   --   HGB 13.3 13.9 13.3  HCT 40.9 41.0 40.1  MCV 103.5*  --  102.3*  PLT 231  --  [id number]    Basic Metabolic Panel:  Recent Labs  Lab 09/21/19 1853 09/21/19 1857 09/22/19 0054  NA 141 142  --   K 3.8 3.7  --   CL 107 107  --   CO2 25  --   --   GLUCOSE 108* 101*  --   BUN 24* 26*  --   CREATININE 1.01* 0.80 0.85  CALCIUM 9.4  --   --     Lipid Panel:     Component Value Date/Time   CHOL 176 09/22/2019 0354   TRIG 66 09/22/2019 0354   HDL 47 09/22/2019 0354   CHOLHDL 3.7 09/22/2019 0354   VLDL 13 09/22/2019 0354   LDLCALC 116 (H) 09/22/2019 0354   HgbA1c:   Lab Results  Component Value Date   HGBA1C 5.3 09/22/2019   Urine Drug Screen: No results found for: LABOPIA, COCAINSCRNUR, LABBENZ, AMPHETMU, THCU, LABBARB  Alcohol Level No results found for: ETH  IMAGING  Dg Skull 1-3 Views 09/22/2019 IMPRESSION:  Tiny linear metallic density projects over the posterior midline scalp, external to the patient on cross-sectional imaging., likely hair-tie, correlate with visual inspection and remove prior to MR scanning. No other metallic foreign body.   Dg Chest 1 View 09/22/2019 IMPRESSION:  1. No radiopaque foreign body or metallic implant.  2. Low lung volumes with associated mild bibasilar atelectasis. No other active cardiopulmonary disease.   Dg Abdomen 1 View 09/22/2019 IMPRESSION:  1. No metallic foreign body or hardware is evident.  2. Diffuse gaseous distention of the bowel particularly the colon, consistent with ileus.    Mr Angio Head Wo Contrast 09/22/2019 IMPRESSION:   MRI HEAD  1. 18 mm acute ischemic nonhemorrhagic perforator type infarct involving the right basal ganglia.  2. Additional 8 mm acute ischemic nonhemorrhagic  right occipital cortical infarct.  3. Underlying advanced age-related cerebral atrophy with chronic small vessel ischemic disease in extensive chronic ischemic changes as above.   MRA HEAD IMPRESSION:  1. Stable intracranial MRA as compared to 2018. 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  2. Diffuse small vessel atheromatous irregularity throughout the intracranial circulation. No proximal high-grade or correctable stenosis.   Ct Head Code Stroke Wo Contrast 09/21/2019 IMPRESSION:  1. Extensive chronic ischemic changes throughout the brain as outlined above. No sign of acute infarction by CT.  2. 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  3.  ASPECTS is 10.   Vas US Carotid (at [place] Only) 09/22/2019 Summary:  Right Carotid: Unable to visualize ICA and ECA secondary to patient's refusal to turn head.  Left Carotid: The extracranial vessels were near-normal with only minimal wall thickening or plaque.  Vertebrals:  Bilateral vertebral arteries demonstrate antegrade flow.  Subclavians: Normal flow hemodynamics were seen in bilateral subclavian arteries.  Preliminary     Transthoracic Echocardiogram  00/00/2020 Pending   ECG - pending   PHYSICAL EXAM Blood pressure (!) 154/87, pulse 73, temperature 99 F (37.2 C), temperature source Oral, resp. rate 17, height 5\' 5"  (1.651 m), weight 57.6 kg, SpO2 100 %.  Exam:                Baseline dementia, she is lethargic, dysarthric, opens eyes to her son's voice then she is slightly interactive Cognition:    Dementia, oriented to self, knows her son    language dysarthric, following simple commands such as smile    Cranial Nerves: PERRL, impaired upgze otherwise eomi blinking to threat bilaterally, right gaze, left facial droop. The palate elevates in the midline. Hearing impaired. Voice is hypophonic. Shoulder shrug not cooperative. The tongue has normal motion without fasciculations,midline.    Coordination:  Not cooperative  Motor Observation:    No abnormal movements Tone:    Increased x 4   Strength:    Left arm 0/5, right arm spontaneously and purposeful and antigravity, bilateral legs 2/5.      Sensation: does not withdraw left arm, otherwise withdraw to pain      ASSESSMENT/PLAN Pamela Choi is a 85 y.o. female with history of mild dementia, seizure hx, warfarin listed as med PTA, anxiety, depression, hypothyroidism, Hld, Htn, and hx of breast cancer presenting with left hemiplegia.  She did not receive IV t-PA due to a large calcified right MCA fusiform aneurysm was noted, precluding tPA administration due to high risk of aneurysmal  rupture.  Stroke: Rt basal ganglia and Rt occipital infarcts. Unclear why patient is on Coumadin, may be afib? If so, this may explain strokes. Also possible given extensive microvascular disease may be small vessel strokes.  Code Stroke CT Head - Extensive chronic ischemic changes throughout the brain as outlined above. No sign of acute infarction by CT. 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 ASPECTS is 10.  MRI head - 18 mm acute ischemic nonhemorrhagic perforator type infarct involving the right basal ganglia. Additional 8 mm acute ischemic nonhemorrhagic right occipital cortical infarct.   MRA head - Fusiform aneurysmal dilatation of the right ICA terminus, stable.  CTA H&N - not ordered  CT Perfusion - not ordered  Carotid Doppler - normal except not able to visualize right ICA and ECA secondary to patient's refusal to turn head.   2D Echo - pending  Lacey Jensen Virus 2  - negative  LDL - 116  HgbA1c - 5.3  UDS - not ordered  VTE prophylaxis - Lovenox Diet  Diet Order            Diet NPO time specified  Diet effective now              aspirin 81 mg daily and warfarin daily prior to admission, now on aspirin 325 mg daily  Patient counseled to be compliant with her antithrombotic medications  Ongoing aggressive stroke risk factor management  Therapy recommendations:  SNF  Disposition:  Pending  Hypertension  Home BP meds: Norvasc  Current BP meds: none  Stable . Permissive hypertension (OK if < 220/120) but gradually normalize in 5-7 days . Long-term BP goal normotensive  Hyperlipidemia  Home Lipid lowering medication:  Pravachol 40 mg daily  LDL 116, goal < 70  Current lipid lowering medication: Pravachol 40 mg daily -> change to Lipitor 40 mg daily  Continue statin at discharge  Other Stroke Risk  Factors  Advanced age  Former cigarette smoker - quit  Family hx stroke (brothers)  Other Active Problems  Seizure hx ? - Keppra ; Depakote   Warfarin listed as home med but INR 1.0 on admission (? Indication). Dr. Nevada Crane to call her living facility to find out. This may change plan on discharge, pending information.  8 mm fusiform aneurysmal dilatation of the right ICA terminus,  Not able to visualize right ICA and ECA secondary to patient's refusal to turn head may need to consider CTA or MRA neck.  NPO -> ASA PR   Hospital day # 1  Personally examined patient and images, and have participated in and made any corrections needed to history, physical, neuro exam,assessment and plan as stated above.  I have personally obtained the history, evaluated lab date, reviewed imaging studies and agree with radiology interpretations.    Sarina Ill, MD Stroke Neurology   A total of 35 minutes was spent for the care of this patient, spent on counseling patient and family on different diagnostic and therapeutic options, counseling and coordination of care, riskd ans benefits of management, compliance, or risk factor reduction and education.   To contact Stroke Continuity provider, please refer to [email or web address] After hours, contact General Neurology

## 2019-09-22 NOTE — Evaluation (Signed)
Occupational Therapy Evaluation Patient Details Name: Pamela Choi MRN: [id number] DOB: Oct 01, 1930 Today's Date: 09/22/2019    History of Present Illness 85 yo female presenting with left side facial droop, left neglect, and slurred speech. MRI showing acute ischemic nonhemorrhagic infarct involving right basal ganglia and right occipital cortex. PMH including dementia, hypertension, hyperlipidemia, depression and anxiety.   Clinical Impression   Per chart review, pt was living at [facility] and required assistance for ADLs (including feeding). Pt currently requiring Total A for ADLs and bed mobility. While sitting at EOB, pt requiring Total A for sitting balance and tolerating sitting for ~5 minutes before asking to return to bed. Pt presenting with no active movement of LUE, poor balance, and decreased strength. Pt tracking and attending to both left and right visual field. Recommend dc to SNF and defer further OT needs to post-acute rehab. All acute OT needs met and will sign off.     Follow Up Recommendations  SNF;Supervision/Assistance - 24 hour    Equipment Recommendations  None recommended by OT    Recommendations for Other Services PT consult     Precautions / Restrictions Precautions Precautions: Fall Restrictions Weight Bearing Restrictions: No      Mobility Bed Mobility Overal bed mobility: Needs Assistance Bed Mobility: Supine to Sit;Sit to Supine     Supine to sit: Total assist;+2 for physical assistance Sit to supine: Total assist;+2 for physical assistance   General bed mobility comments: Total A to bring BLEs towards EOB and then elevate trunk  Transfers                 General transfer comment: Deferred for safety    Balance Overall balance assessment: Needs assistance Sitting-balance support: No upper extremity supported;Feet supported Sitting balance-Leahy Scale: Zero Sitting balance - Comments: Total A for sitting at EOB                                    ADL either performed or assessed with clinical judgement   ADL Overall ADL's : Needs assistance/impaired     Grooming: Wash/dry face;Maximal assistance;Total assistance;Bed level Grooming Details (indicate cue type and reason): Pt attempting to wash her face while supien in bed, but unable to bring RUE to face nad required hand over hand to perform task. Pt moaning during task                               General ADL Comments: Total A for ADLs and bed mobility.     Vision   Additional Comments: Pt tracking to left and right to movement and noise. Noting smooth tracking throughout.      Perception     Praxis      Pertinent Vitals/Pain Pain Assessment: Faces Faces Pain Scale: Hurts even more Pain Location: "My everything" Pain Descriptors / Indicators: Constant;Discomfort;Moaning Pain Intervention(s): Monitored during session;Repositioned     Hand Dominance     Extremity/Trunk Assessment Upper Extremity Assessment Upper Extremity Assessment: LUE deficits/detail LUE Deficits / Details: No active movement with LUE. No increase in tone noted LUE Coordination: decreased fine motor;decreased gross motor   Lower Extremity Assessment Lower Extremity Assessment: Defer to PT evaluation   Cervical / Trunk Assessment Cervical / Trunk Assessment: Other exceptions Cervical / Trunk Exceptions: Poor trunk control   Communication Communication Communication: No difficulties   Cognition Arousal/Alertness: Awake/alert  Behavior During Therapy: Flat affect Overall Cognitive Status: History of cognitive impairments - at baseline                                     General Comments       Exercises     Shoulder Instructions      Home Living Family/patient expects to be discharged to:: Skilled nursing facility                                 Additional Comments: Per chart review, pt residing at [facility].      Prior Functioning/Environment Level of Independence: Needs assistance        Comments: Assistance for all ADLs including feeding per chart        OT Problem List: Decreased range of motion;Impaired balance (sitting and/or standing);Decreased knowledge of use of DME or AE;Decreased cognition;Decreased coordination;Pain;Impaired UE functional use      OT Treatment/Interventions:      OT Goals(Current goals can be found in the care plan section) Acute Rehab OT Goals Patient Stated Goal: "Lay back down" OT Goal Formulation: All assessment and education complete, DC therapy  OT Frequency:     Barriers to D/C:            Co-evaluation              AM-PAC OT "6 Clicks" Daily Activity     Outcome Measure Help from another person eating meals?: Total Help from another person taking care of personal grooming?: Total Help from another person toileting, which includes using toliet, bedpan, or urinal?: Total Help from another person bathing (including washing, rinsing, drying)?: Total Help from another person to put on and taking off regular upper body clothing?: Total Help from another person to put on and taking off regular lower body clothing?: Total 6 Click Score: 6   End of Session Nurse Communication: Mobility status  Activity Tolerance: Patient tolerated treatment well Patient left: in bed;with call bell/phone within reach;with bed alarm set  OT Visit Diagnosis: Muscle weakness (generalized) (M62.81);Other symptoms and signs involving cognitive function;Hemiplegia and hemiparesis Hemiplegia - Right/Left: Left Hemiplegia - dominant/non-dominant: (Unsure) Hemiplegia - caused by: Cerebral infarction                Time: 6256-3893 OT Time Calculation (min): 17 min Charges:  OT General Charges $OT Visit: 1 Visit OT Evaluation $OT Eval Moderate Complexity: [place], OTR/L Acute Rehab Pager: [phone] Office:  [place] 09/22/2019, 3:49 PM

## 2019-09-22 NOTE — Progress Notes (Signed)
Initial Nutrition Assessment  DOCUMENTATION CODES:   Not applicable  INTERVENTION:  -Monitor for diet advancement -Provide nutrition supplement when appropriate  NUTRITION DIAGNOSIS:   Inadequate oral intake related to acute illness, chronic illness(ischemic nonhemorrhagic infart; severe dementia) as evidenced by NPO status.  GOAL:   Patient will meet greater than or equal to 90% of their needs  MONITOR:   Diet advancement, Labs, I & O's, Weight trends  REASON FOR ASSESSMENT:   Consult Assessment of nutrition requirement/status  ASSESSMENT:  RD working remotely.  85 year old female ALF resident with medical history significant of severe dementia, HTN, HLD, hx of breast cancer, pericarditis, anxiety and depression who was found by nursing staff with slurred speech, drooping and left-sided weakness. Patient presented to ED as code stroke.  MRI showed acute ischemic nonhemorrhagic infarct. TPA not given due to high risk of aneurysmal rupture related to large calcified right MCA fusiform aneurysm  Unable to obtain nutrition history; patient with severe dementia, mostly non-verbal at baseline. Per chart review, son of patient reports that she has been in the nursing home for the past 3 years s/p a fall and has had progressively worsening memory issues. She is mostly non-verbal, sometimes able to answer questions although speech is mostly incoherent. Patient requires assistance with all ALDs, including feeding at nursing facility.    Currently patient is NPO. Will continue to monitor for diet order and provide nutrition supplement when appropriate.  Current wt 57.6 kg (126.7 lb) No recent wt history for review; noted 158.6 lb in March of 2018  Medications and labs reviewed   NUTRITION - FOCUSED PHYSICAL EXAM: Unable to complete at this time; RD working remotely.    Diet Order:   Diet Order            Diet NPO time specified  Diet effective now               EDUCATION NEEDS:   No education needs have been identified at this time  Skin:  Skin Assessment: Reviewed RN Assessment  Last BM:  Unknown  Height:   Ht Readings from Last 1 Encounters:  09/22/19 5\' 5"  (1.651 m)    Weight:   Wt Readings from Last 1 Encounters:  09/22/19 57.6 kg    Ideal Body Weight:  56.8 kg  BMI:  Body mass index is 21.13 kg/m.  Estimated Nutritional Needs:   Kcal:  1600-1800  Protein:  75-81  Fluid:  >/=1.6 L/day   Lajuan Lines, RD, LDN [facility] Telephone [phone] After Hours/Weekend Pager: [phone]

## 2019-09-22 NOTE — ED Notes (Signed)
Patient transported to MRI 

## 2019-09-22 NOTE — ED Notes (Signed)
ED TO INPATIENT HANDOFF REPORT  ED Nurse Name and Phone #: Lattie Haw, [place]  S Name/Age/Gender Pamela Choi 85 y.o. female Room/Bed: 039C/039C  Code Status   Code Status: DNR  Home/SNF/Other Skilled nursing facility Patient oriented to: self Is this baseline? No   Triage Complete: Triage complete  Chief Complaint Code Stroke   Triage Note Pt BIB [facility] from [place] home. Per EMS pt began having slurred speech, drooling and left-sided weakness @ 1700. Per EMS pt with left sided weakness and forced gaze to the right. Pt drooling upon EMS arrival.    Allergies Allergies  Allergen Reactions  . Bee Venom Anaphylaxis    Level of Care/Admitting Diagnosis ED Disposition    ED Disposition Condition [facility] Area: [place] [100100]  Level of Care: Telemetry Cardiac [103]  Covid Evaluation: Asymptomatic Screening Protocol (No Symptoms)  Diagnosis: Hemineglect [329924]  Admitting Physician: Orene Desanctis [[id number]]  Attending Physician: Orene Desanctis [[id number]]  Estimated length of stay: past midnight tomorrow  Certification:: I certify this patient will need inpatient services for at least 2 midnights  PT Class (Do Not Modify): Inpatient [101]  PT Acc Code (Do Not Modify): Private [1]       B Medical/Surgery History Past Medical History:  Diagnosis Date  . Acute pericarditis 07/20/2015  . Allergy   . ANKLE SPRAIN, RIGHT 05/10/2007   Qualifier: Diagnosis of  By: Norma Fredrickson MD, Larena Glassman    . Anxiety and depression   . 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 Chicken pox   . Hyperlipidemia   . Hypertension   . Mild dementia ([place])    eval with neurology in 2014, mod SVD, declined tx  . Osteopenia   . Pericardial effusion    a. 06/2015 Echo: EF 55%, mild  LVH, mild AI, mild dil of Asc Ao (79m, 333m@ root), nl RV fxn, mild TR, small to mod circumferential pericardial effusion.  . Pericarditis    a. 06/2015  . Plantar wart of right foot   . TOBACCO ABUSE, HX OF 11/08/2006   Qualifier: Diagnosis of  By: SiLinden DolinD, AaMarjory Lies   Past Surgical History:  Procedure Laterality Date  . BREAST SURGERY    . BUNIONECTOMY     R foot  . HAMMER TOE SURGERY     Right foot     A IV Location/Drains/Wounds Patient Lines/Drains/Airways Status   Active Line/Drains/Airways    Name:   Placement date:   Placement time:   Site:   Days:   Peripheral IV 09/21/19 Anterior;Left;Proximal Antecubital   09/21/19    1850    Antecubital   1          Intake/Output Last 24 hours No intake or output data in the 24 hours ending 09/22/19 1146  Labs/Imaging Results for orders placed or performed during the hospital encounter of 09/21/19 (from the past 48 hour(s))  Protime-INR     Status: None   Collection Time: 09/21/19  6:53 PM  Result Value Ref Range   Prothrombin Time 13.0 11.4 - 15.2 seconds   INR 1.0 0.8 - 1.2    Comment: (NOTE) INR goal varies based on device and disease states. Performed at [facility] [place] [id number]  APTT     Status: None   Collection Time: 09/21/19  6:53 PM  Result Value Ref Range   aPTT 28 24 - 36 seconds    Comment: Performed at [facility], [place] [address], [place], [place] [address]  CBC     Status: Abnormal   Collection Time: 09/21/19  6:53 PM  Result Value Ref Range   WBC 6.4 4.0 - 10.5 K/uL   RBC 3.95 3.87 - 5.11 MIL/uL   Hemoglobin 13.3 12.0 - 15.0 g/dL   HCT 40.9 36.0 - 46.0 %   MCV 103.5 (H) 80.0 - 100.0 fL   MCH 33.7 26.0 - 34.0 pg   MCHC 32.5 30.0 - 36.0 g/dL   RDW 12.2 11.5 - 15.5 %   Platelets 231 150 - 400 K/uL   nRBC 0.0 0.0 - 0.2 %    Comment: Performed at [facility], [place] [address] [place], [place] [address]  Differential     Status: None   Collection  Time: 09/21/19  6:53 PM  Result Value Ref Range   Neutrophils Relative % 59 %   Neutro Abs 3.8 1.7 - 7.7 K/uL   Lymphocytes Relative 30 %   Lymphs Abs 1.9 0.7 - 4.0 K/uL   Monocytes Relative 9 %   Monocytes Absolute 0.6 0.1 - 1.0 K/uL   Eosinophils Relative 1 %   Eosinophils Absolute 0.1 0.0 - 0.5 K/uL   Basophils Relative 1 %   Basophils Absolute 0.0 0.0 - 0.1 K/uL   Immature Granulocytes 0 %   Abs Immature Granulocytes 0.01 0.00 - 0.07 K/uL    Comment: Performed at [facility] [address], [place], [place] [address]  Comprehensive metabolic panel     Status: Abnormal   Collection Time: 09/21/19  6:53 PM  Result Value Ref Range   Sodium 141 135 - 145 mmol/L   Potassium 3.8 3.5 - 5.1 mmol/L   Chloride 107 98 - 111 mmol/L   CO2 25 22 - 32 mmol/L   Glucose, Bld 108 (H) 70 - 99 mg/dL   BUN 24 (H) 8 - 23 mg/dL   Creatinine, Ser 1.01 (H) 0.44 - 1.00 mg/dL   Calcium 9.4 8.9 - 10.3 mg/dL   Total Protein 7.1 6.5 - 8.1 g/dL   Albumin 3.5 3.5 - 5.0 g/dL   AST 18 15 - 41 U/L   ALT 10 0 - 44 U/L   Alkaline Phosphatase 98 38 - 126 U/L   Total Bilirubin 0.2 (L) 0.3 - 1.2 mg/dL   GFR calc non Af Amer 50 (L) >60 mL/min   GFR calc Af Amer 58 (L) >60 mL/min   Anion gap 9 5 - 15    Comment: Performed at [facility] [address], [place], [place] [address]  I-stat chem 8, ED     Status: Abnormal   Collection Time: 09/21/19  6:57 PM  Result Value Ref Range   Sodium 142 135 - 145 mmol/L   Potassium 3.7 3.5 - 5.1 mmol/L   Chloride 107 98 - 111 mmol/L   BUN 26 (H) 8 - 23 mg/dL   Creatinine, Ser 0.80 0.44 - 1.00 mg/dL   Glucose, Bld 101 (H) 70 - 99 mg/dL   Calcium, Ion 1.12 (L) 1.15 - 1.40 mmol/L   TCO2 23 22 - 32 mmol/L   Hemoglobin 13.9 12.0 - 15.0 g/dL   HCT 41.0 36.0 - 46.0 %  SARS CORONAVIRUS 2 (TAT  6-24 HRS) Nasopharyngeal Nasopharyngeal Swab     Status: None   Collection Time: 09/21/19  9:50 PM   Specimen: Nasopharyngeal Swab  Result Value Ref Range   SARS  Coronavirus 2 NEGATIVE NEGATIVE    Comment: (NOTE) SARS-CoV-2 target nucleic acids are NOT DETECTED. The SARS-CoV-2 RNA is generally detectable in upper and lower respiratory specimens during the acute phase of infection. Negative results do not preclude SARS-CoV-2 infection, do not rule out co-infections with other pathogens, and should not be used as the sole basis for treatment or other patient management decisions. Negative results must be combined with clinical observations, patient history, and epidemiological information. The expected result is Negative. Fact Sheet for Patients: SugarRoll.be Fact Sheet for Healthcare Providers: https://www.woods-mathews.com/ This test is not yet approved or cleared by the [place] FDA and  has been authorized for detection and/or diagnosis of SARS-CoV-2 by FDA under an Emergency Use Authorization (EUA). This EUA will remain  in effect (meaning this test can be used) for the duration of the COVID-19 declaration under Section 56 4(b)(1) of the Act, 21 U.S.C. section 360bbb-3(b)(1), unless the authorization is terminated or revoked sooner. Performed at [facility], [place] [address], [place], [place] [address]   CBC     Status: Abnormal   Collection Time: 09/22/19 12:54 AM  Result Value Ref Range   WBC 8.1 4.0 - 10.5 K/uL   RBC 3.92 3.87 - 5.11 MIL/uL   Hemoglobin 13.3 12.0 - 15.0 g/dL   HCT 40.1 36.0 - 46.0 %   MCV 102.3 (H) 80.0 - 100.0 fL   MCH 33.9 26.0 - 34.0 pg   MCHC 33.2 30.0 - 36.0 g/dL   RDW 12.2 11.5 - 15.5 %   Platelets 217 150 - 400 K/uL   nRBC 0.0 0.0 - 0.2 %    Comment: Performed at [facility], [place] [address] [place], [place] [address]  Creatinine, serum     Status: None   Collection Time: 09/22/19 12:54 AM  Result Value Ref Range   Creatinine, Ser 0.85 0.44 - 1.00 mg/dL   GFR calc non Af Amer >60 >60 mL/min   GFR calc Af Amer >60 >60 mL/min    Comment:  Performed at [facility] [address], [place], [place] [address]  Hemoglobin A1c     Status: None   Collection Time: 09/22/19  3:54 AM  Result Value Ref Range   Hgb A1c MFr Bld 5.3 4.8 - 5.6 %    Comment: (NOTE) Pre diabetes:          5.7%-6.4% Diabetes:              >6.4% Glycemic control for   <7.0% adults with diabetes    Mean Plasma Glucose 105.41 mg/dL    Comment: Performed at [facility] [address], [place], [place] [address]  Lipid panel     Status: Abnormal   Collection Time: 09/22/19  3:54 AM  Result Value Ref Range   Cholesterol 176 0 - 200 mg/dL   Triglycerides 66 <150 mg/dL   HDL 47 >40 mg/dL   Total CHOL/HDL Ratio 3.7 RATIO   VLDL 13 0 - 40 mg/dL   LDL Cholesterol 116 (H) 0 - 99 mg/dL    Comment:        Total Cholesterol/HDL:CHD Risk Coronary Heart Disease Risk Table  Men   Women  1/2 Average Risk   3.4   3.3  Average Risk       5.0   4.4  2 X Average Risk   9.6   7.1  3 X Average Risk  23.4   11.0        Use the calculated Patient Ratio above and the CHD Risk Table to determine the patient's CHD Risk.        ATP III CLASSIFICATION (LDL):  <100     mg/dL   Optimal  100-129  mg/dL   Near or Above                    Optimal  130-159  mg/dL   Borderline  160-189  mg/dL   High  >190     mg/dL   Very High Performed at [facility] [address], [place], [place] [address]    Dg Skull 1-3 Views  Result Date: 09/22/2019 CLINICAL DATA:  Pre MRI clearance EXAM: SKULL - 1-3 VIEW COMPARISON:  CT head same day FINDINGS: Tiny metallic density projects over the posterior midline scalp, external to the patient on same day CT of the head. No other metallic foreign body is identified. There is no evidence of skull fracture or other focal bone lesions. The maxillary dentition is absent. Multiple mandibular teeth are also absent with mild mandibular prognathism. Mild degenerative cervical spondylitic changes are seen.  IMPRESSION: Tiny linear metallic density projects over the posterior midline scalp, external to the patient on cross-sectional imaging., likely hair-tie, correlate with visual inspection and remove prior to MR scanning. No other metallic foreign body. Electronically Signed   By: Lovena Le M.D.   On: 09/22/2019 01:29   Dg Chest 1 View  Result Date: 09/22/2019 CLINICAL DATA:  Initial evaluation for MRI clearance, stroke. EXAM: CHEST  1 VIEW COMPARISON:  Prior radiograph from 03/04/2017. FINDINGS: Cardiac and mediastinal silhouettes are stable, and remain within normal limits. Lungs hypoinflated with elevation of the right hemidiaphragm. Mild bibasilar atelectasis. No focal infiltrate, edema, or effusion. No pneumothorax. No acute osseous finding. No radiopaque foreign body or metallic implant. Multiple scattered prominent gas-filled loops of bowel noted within the visualized upper abdomen. IMPRESSION: 1. No radiopaque foreign body or metallic implant. 2. Low lung volumes with associated mild bibasilar atelectasis. No other active cardiopulmonary disease. Electronically Signed   By: Jeannine Boga M.D.   On: 09/22/2019 02:22   Dg Abdomen 1 View  Result Date: 09/22/2019 CLINICAL DATA:  Pre MRI EXAM: ABDOMEN - 1 VIEW COMPARISON:  None. FINDINGS: No metallic foreign body or hardware is evident. There is diffuse gaseous distention of the bowel particularly the colon. Large volume of fecal material projects over the rectal vault. Phleboliths are present in the pelvis. Osseous structures are unremarkable aside from degenerative changes in the lower lumbar spine and hips. Cardiac monitor leads overlie the chest. IMPRESSION: 1. No metallic foreign body or hardware is evident. 2. Diffuse gaseous distention of the bowel particularly the colon, consistent with ileus. Electronically Signed   By: Lovena Le M.D.   On: 09/22/2019 01:26   Mr Angio Head Wo Contrast  Result Date: 09/22/2019 CLINICAL DATA:   Initial evaluation for acute stroke. EXAM: MRI HEAD WITHOUT CONTRAST MRA HEAD WITHOUT CONTRAST TECHNIQUE: Multiplanar, multiecho pulse sequences of the brain and surrounding structures were obtained without intravenous contrast. Angiographic images of the head were obtained using MRA technique without contrast. COMPARISON:  Prior CT  from 09/21/2019. FINDINGS: MRI HEAD FINDINGS Brain: Advanced age-related cerebral atrophy, most notable at the anterior temporal lobes bilaterally. Patchy and confluent T2/FLAIR hyperintensity within the periventricular deep white matter both cerebral hemispheres most consistent with chronic small vessel ischemic disease, also fairly advanced. Multiple remote lacunar infarcts noted within the bilateral thalami and pons. Multiple scatter remote bilateral cerebellar infarcts. 18 mm acute ischemic perforator type infarct seen extending from the right lentiform nucleus to the right caudate. Additional 8 mm linear acute ischemic infarct noted involving the cortical gray matter of the right occipital lobe (series 13, image 66). No associated hemorrhage or mass effect. No other evidence for acute or subacute ischemia. Gray-white matter differentiation otherwise maintained. No evidence for acute or chronic intracranial hemorrhage. No mass lesion, midline shift or mass effect. No hydrocephalus. No extra-axial fluid collection. Incidental note made of an empty sella. Vascular: Major intracranial vascular flow voids maintained at the skull base. Skull and upper cervical spine: Craniocervical junction within normal limits. Bone marrow signal intensity normal. No scalp soft tissue abnormality. Sinuses/Orbits: Patient status post ocular lens replacement on the right. Mild scattered mucosal thickening noted within the ethmoidal air cells and maxillary sinuses. No mastoid effusion. Other: None. MRA HEAD FINDINGS ANTERIOR CIRCULATION: Visualized distal cervical segments of the internal carotid arteries  are patent with antegrade flow. Both internal carotid arteries patent to the termini without flow-limiting stenosis. ICAs are both somewhat ectatic through the siphon region. 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 A1 segments patent bilaterally. Negative anterior communicating artery complex. Anterior cerebral arteries patent to their distal aspects without stenosis. M1 segments remain patent without significant stenosis. Normal MCA bifurcations. Distal MCA branches well perfused and symmetric. POSTERIOR CIRCULATION: Right vertebral artery slightly dominant and tortuous, crossing the midline prior to the vertebrobasilar junction. Left vertebral artery slightly diminutive. Vertebral arteries remain widely patent to the vertebrobasilar junction. PICA origins not well seen. Basilar diminutive but remains patent to its distal aspect without flow-limiting stenosis. Superior cerebral arteries patent bilaterally. Fetal type origin of the right PCA. Left PCA supplied via a hypoplastic left P1 as well as a robust left posterior communicating artery. PCAs perfused to their distal aspects without stenosis. Diffuse small vessel atheromatous irregularity noted throughout the intracranial circulation. IMPRESSION: MRI HEAD IMPRESSION: 1. 18 mm acute ischemic nonhemorrhagic perforator type infarct involving the right basal ganglia. 2. Additional 8 mm acute ischemic nonhemorrhagic right occipital cortical infarct. 3. Underlying advanced age-related cerebral atrophy with chronic small vessel ischemic disease in extensive chronic ischemic changes as above. MRA HEAD IMPRESSION: 1. Stable intracranial MRA as compared to 2018. 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 2. Diffuse small vessel atheromatous irregularity throughout the intracranial circulation. No proximal high-grade or correctable stenosis. Electronically Signed   By: Jeannine Boga M.D.    On: 09/22/2019 03:30   Mr Brain Wo Contrast  Result Date: 09/22/2019 CLINICAL DATA:  Initial evaluation for acute stroke. EXAM: MRI HEAD WITHOUT CONTRAST MRA HEAD WITHOUT CONTRAST TECHNIQUE: Multiplanar, multiecho pulse sequences of the brain and surrounding structures were obtained without intravenous contrast. Angiographic images of the head were obtained using MRA technique without contrast. COMPARISON:  Prior CT from 09/21/2019. FINDINGS: MRI HEAD FINDINGS Brain: Advanced age-related cerebral atrophy, most notable at the anterior temporal lobes bilaterally. Patchy and confluent T2/FLAIR hyperintensity within the periventricular deep white matter both cerebral hemispheres most consistent with chronic small vessel ischemic disease, also fairly advanced. Multiple remote lacunar infarcts  noted within the bilateral thalami and pons. Multiple scatter remote bilateral cerebellar infarcts. 18 mm acute ischemic perforator type infarct seen extending from the right lentiform nucleus to the right caudate. Additional 8 mm linear acute ischemic infarct noted involving the cortical gray matter of the right occipital lobe (series 13, image 66). No associated hemorrhage or mass effect. No other evidence for acute or subacute ischemia. Gray-white matter differentiation otherwise maintained. No evidence for acute or chronic intracranial hemorrhage. No mass lesion, midline shift or mass effect. No hydrocephalus. No extra-axial fluid collection. Incidental note made of an empty sella. Vascular: Major intracranial vascular flow voids maintained at the skull base. Skull and upper cervical spine: Craniocervical junction within normal limits. Bone marrow signal intensity normal. No scalp soft tissue abnormality. Sinuses/Orbits: Patient status post ocular lens replacement on the right. Mild scattered mucosal thickening noted within the ethmoidal air cells and maxillary sinuses. No mastoid effusion. Other: None. MRA HEAD FINDINGS  ANTERIOR CIRCULATION: Visualized distal cervical segments of the internal carotid arteries are patent with antegrade flow. Both internal carotid arteries patent to the termini without flow-limiting stenosis. ICAs are both somewhat ectatic through the siphon region. 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 A1 segments patent bilaterally. Negative anterior communicating artery complex. Anterior cerebral arteries patent to their distal aspects without stenosis. M1 segments remain patent without significant stenosis. Normal MCA bifurcations. Distal MCA branches well perfused and symmetric. POSTERIOR CIRCULATION: Right vertebral artery slightly dominant and tortuous, crossing the midline prior to the vertebrobasilar junction. Left vertebral artery slightly diminutive. Vertebral arteries remain widely patent to the vertebrobasilar junction. PICA origins not well seen. Basilar diminutive but remains patent to its distal aspect without flow-limiting stenosis. Superior cerebral arteries patent bilaterally. Fetal type origin of the right PCA. Left PCA supplied via a hypoplastic left P1 as well as a robust left posterior communicating artery. PCAs perfused to their distal aspects without stenosis. Diffuse small vessel atheromatous irregularity noted throughout the intracranial circulation. IMPRESSION: MRI HEAD IMPRESSION: 1. 18 mm acute ischemic nonhemorrhagic perforator type infarct involving the right basal ganglia. 2. Additional 8 mm acute ischemic nonhemorrhagic right occipital cortical infarct. 3. Underlying advanced age-related cerebral atrophy with chronic small vessel ischemic disease in extensive chronic ischemic changes as above. MRA HEAD IMPRESSION: 1. Stable intracranial MRA as compared to 2018. 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 2. Diffuse small vessel atheromatous irregularity throughout the intracranial circulation. No proximal  high-grade or correctable stenosis. Electronically Signed   By: Jeannine Boga M.D.   On: 09/22/2019 03:30   Ct Head Code Stroke Wo Contrast  Result Date: 09/21/2019 CLINICAL DATA:  Code stroke.  Left-sided weakness.  Slurred speech. EXAM: CT HEAD WITHOUT CONTRAST TECHNIQUE: Contiguous axial images were obtained from the base of the skull through the vertex without intravenous contrast. COMPARISON:  05/06/2019 FINDINGS: Brain: Chronic ischemic changes are seen affecting the pons. 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 Cerebral hemispheres elsewhere show old small vessel infarctions of the basal ganglia and chronic confluent small-vessel change of the white matter. No acute cortical or large vessel territory infarction is visible. No mass, hemorrhage, hydrocephalus or extra-axial collection. Vascular: There is atherosclerotic calcification of the major vessels at the base of the brain. 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 Skull: Negative Sinuses/Orbits: Clear/normal Other: None ASPECTS ([place] Stroke Program Early CT Score) - Ganglionic level infarction (caudate, lentiform nuclei, internal capsule, insula, M1-M3 cortex): 7 - Supraganglionic infarction (M4-M6 cortex): 3 Total score (0-10 with 10 being normal): 10 IMPRESSION: 1. Extensive chronic ischemic changes throughout the brain as outlined above. No sign of acute infarction by CT. 2. 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 3. ASPECTS is 10. 4. The case was discussed with Dr. Cheral Marker at 1910 hours while the examination was still preliminary. Electronically Signed   By: Nelson Chimes M.D.   On: 09/21/2019 19:21     Pending Labs Unresulted Labs (From admission, onward)    Start     Ordered   09/28/19 0500  Creatinine, serum  (enoxaparin (LOVENOX)    CrCl >/= 30 ml/min)  Weekly,   R    Comments: while on enoxaparin therapy    09/21/19 2216          Vitals/Pain Today's Vitals   09/22/19 1000 09/22/19 1030 09/22/19 1100 09/22/19 1115  BP: 136/83 (!) 145/84 134/83 (!) 141/87  Pulse: 79 75 78 83  Resp: 20 18 (!) 21 19  Temp:      TempSrc:      SpO2: 98% 98% 99% 98%  PainSc:        Isolation Precautions No active isolations  Medications Medications  sodium chloride flush (NS) 0.9 % injection 3 mL (has no administration in time range)   stroke: mapping our early stages of recovery book (has no administration in time range)  0.9 %  sodium chloride infusion ( Intravenous New Bag/Given 09/22/19 0448)  acetaminophen (TYLENOL) tablet 650 mg (has no administration in time range)    Or  acetaminophen (TYLENOL) solution 650 mg (has no administration in time range)    Or  acetaminophen (TYLENOL) suppository 650 mg (has no administration in time range)  senna-docusate (Senokot-S) tablet 1 tablet (has no administration in time range)  enoxaparin (LOVENOX) injection 40 mg (40 mg Subcutaneous Given 09/22/19 0445)  labetalol (NORMODYNE) injection 5 mg (5 mg Intravenous Given 09/22/19 0448)  aspirin tablet 325 mg (325 mg Oral Not Given 09/22/19 1001)  pravastatin (PRAVACHOL) tablet 40 mg (40 mg Oral Not Given 09/22/19 0440)  escitalopram (LEXAPRO) tablet 10 mg (10 mg Oral Not Given 09/22/19 1001)  levothyroxine (SYNTHROID) tablet 37.5 mcg (37.5 mcg Oral Not Given 09/22/19 0637)  famotidine (PEPCID) tablet 20 mg (20 mg Oral Not Given 09/22/19 1002)  senna-docusate (Senokot-S) tablet 2 tablet (2 tablets Oral Not Given 09/22/19 1002)    Mobility non-ambulatory High fall risk   Focused Assessments Neuro Assessment Handoff:  Swallow screen pass? No    NIH Stroke Scale ( + Modified Stroke Scale  Criteria)  Interval: Shift assessment Level of Consciousness (1a.)   : Alert, keenly responsive LOC Questions (1b. )   +: Answers neither question correctly LOC Commands (1c. )   + : Performs neither task correctly Best Gaze (2. )  +: Forced deviation Visual (3. )  +: No visual loss Facial Palsy (4. )    : Partial paralysis  Motor Arm, Left (5a. )   +: No effort against gravity Motor Arm, Right (5b. )   +: No effort against gravity Motor Leg, Left (6a. )   +: No effort against gravity Motor Leg, Right (6b. )   +:  No effort against gravity Limb Ataxia (7. ): Present in two limbs Sensory (8. )   +: Mild-to-moderate sensory loss, patient feels pinprick is less sharp or is dull on the affected side, or there is a loss of superficial pain with pinprick, but patient is aware of being touched Best Language (9. )   +: Mild-to-moderate aphasia Dysarthria (10. ): Mild-to-moderate dysarthria, patient slurs at least some words and, at worst, can be understood with some difficulty Extinction/Inattention (11.)   +: Visual/tactile/auditory/spatial/personal inattention Modified SS Total  +: 21 Complete NIHSS TOTAL: 26 Last date known well: 09/21/19 Last time known well: 1700 Neuro Assessment: Exceptions to WDL Neuro Checks:   Initial (09/21/19 1921)  Last Documented NIHSS Modified Score: 21 (09/22/19 0531) Has TPA been given? No If patient is a Neuro Trauma and patient is going to OR before floor call report to [place] nurse: [phone] or [phone]     R Recommendations: See Admitting Provider Note  Report given to:   Additional Notes:

## 2019-09-23 ENCOUNTER — Inpatient Hospital Stay (HOSPITAL_COMMUNITY): Payer: Medicare Other

## 2019-09-23 DIAGNOSIS — I351 Nonrheumatic aortic (valve) insufficiency: Secondary | ICD-10-CM

## 2019-09-23 MED ORDER — LEVOTHYROXINE SODIUM 100 MCG/5ML IV SOLN
12.5000 ug | Freq: Every day | INTRAVENOUS | Status: DC
  Administered 2019-09-23: 12.5 ug via INTRAVENOUS
  Filled 2019-09-23 (×2): qty 5

## 2019-09-23 MED ORDER — VALPROATE SODIUM 500 MG/5ML IV SOLN
250.0000 mg | Freq: Two times a day (BID) | INTRAVENOUS | Status: DC
  Administered 2019-09-23 – 2019-09-26 (×7): 250 mg via INTRAVENOUS
  Filled 2019-09-23 (×9): qty 2.5

## 2019-09-23 MED ORDER — ORAL CARE MOUTH RINSE
15.0000 mL | Freq: Two times a day (BID) | OROMUCOSAL | Status: DC
  Administered 2019-09-23 – 2019-09-26 (×7): 15 mL via OROMUCOSAL

## 2019-09-23 MED ORDER — LEVETIRACETAM IN NACL 500 MG/100ML IV SOLN
500.0000 mg | Freq: Two times a day (BID) | INTRAVENOUS | Status: DC
  Administered 2019-09-23 – 2019-09-26 (×7): 500 mg via INTRAVENOUS
  Filled 2019-09-23 (×7): qty 100

## 2019-09-23 MED ORDER — RESOURCE THICKENUP CLEAR PO POWD
ORAL | Status: DC | PRN
  Administered 2019-09-23: 18:00:00 via ORAL
  Filled 2019-09-23: qty 125

## 2019-09-23 NOTE — TOC Initial Note (Signed)
Transition of Care [facility]) - Initial/Assessment Note    Patient Details  Name: Pamela Choi MRN: [id number] Date of Birth: 24-Dec-1930  [facility]) CM/SW Contact:    Gelene Mink, [place] Phone Number: 09/23/2019, 12:04 PM  Clinical Narrative:            CSW called and spoke with the patient's son, Percell Miller. He is also her POA. CSW introduced herself and explained her role. He explained that his mother is a long-term care resident at [facility]. CSW shared with him that therapy has been working with the patient. He is in agreement for his mother to return back to [facility] pending insurance authorization to complete some therapy.   CSW called and spoke with April at [facility]. She stated that they will be able to accept the patient back and provide therapy pending insurance.   CSW will continue to follow and assist with disposition planning.          Expected Discharge Plan: Skilled Nursing Facility Barriers to Discharge: Insurance Authorization, Continued Medical Work up   Patient Goals and CMS Choice Patient states their goals for this hospitalization and ongoing recovery are:: Pt son would like her to return to [facility] and receive some therapy CMS Medicare.gov Compare Post Acute Care list provided to:: Patient Represenative (must comment) Choice offered to / list presented to : [place] / Guardian  Expected Discharge Plan and Services Expected Discharge Plan: [place] In-house Referral: Clinical Social Work Discharge Planning Services: NA Post Acute Care Choice: [place] Living arrangements for the past 2 months: [place]: NA          Prior Living Arrangements/Services Living arrangements for the past 2 months: [place] Lives with:: Facility Resident Patient language and need for interpreter reviewed:: No Do you feel safe going back to the place  where you live?: Yes      Need for Family Participation in Patient Care: Yes (Comment) Care giver support system in place?: Yes (comment)   Criminal Activity/Legal Involvement Pertinent to Current Situation/Hospitalization: No - Comment as needed  Activities of Daily Living Home Assistive Devices/Equipment: Wheelchair(wheelchair at her facility) ADL Screening (condition at time of admission) Patient's cognitive ability adequate to safely complete daily activities?: No Is the patient deaf or have difficulty hearing?: No Does the patient have difficulty seeing, even when wearing glasses/contacts?: No Does the patient have difficulty concentrating, remembering, or making decisions?: Yes Patient able to express need for assistance with ADLs?: No Does the patient have difficulty dressing or bathing?: Yes Independently performs ADLs?: No Communication: Needs assistance Is this a change from baseline?: Change from baseline, expected to last >3 days Dressing (OT): Dependent Is this a change from baseline?: Change from baseline, expected to last >3 days Grooming: Dependent Is this a change from baseline?: Change from baseline, expected to last >3 days Feeding: Dependent Is this a change from baseline?: Change from baseline, expected to last >3 days Bathing: Dependent Is this a change from baseline?: Change from baseline, expected to last >3 days Toileting: Dependent Is this a change from baseline?: Change from baseline, expected to last >3days In/Out Bed: Dependent Is this a change from baseline?: Change from baseline, expected to last >3 days Walks in Home: Dependent Is this a change from baseline?: Change  from baseline, expected to last >3 days Does the patient have difficulty walking or climbing stairs?: Yes Weakness of Legs: Both Weakness of Arms/Hands: Left  Permission Sought/Granted Permission sought to share information with : Case Manager Permission granted to share information  with : Yes, Verbal Permission Granted  Share Information with NAME: Percell Miller  Permission granted to share info w AGENCY: [facility]  Permission granted to share info w Relationship: Son/POA     Emotional Assessment Appearance:: Appears stated age Attitude/Demeanor/Rapport: Unable to Assess Affect (typically observed): Unable to Assess Orientation: : Oriented to Self Alcohol / Substance Use: Not Applicable Psych Involvement: No (comment)  Admission diagnosis:  Stroke (cerebrum) (HCC) [I63.9] Acute ischemic right MCA stroke ([place]) [I63.511] Encounter for imaging to screen for metal prior to magnetic resonance imaging (MRI) [Z13.89] Patient Active Problem List   Diagnosis Date Noted  . Hemineglect 09/21/2019  . Syncope and collapse 02/28/2017  . Recurrent major depressive disorder, in full remission ([place]) 12/30/2016  . Memory loss 02/22/2013  . Hyperlipemia 01/11/2007  . Essential hypertension 11/08/2006   PCP:  Lucretia Kern, DO Pharmacy:   [facility] [address] [place] [address] Phone: [phone] Fax: [phone]     Social Determinants of Health (SDOH) Interventions    Readmission Risk Interventions No flowsheet data found.

## 2019-09-23 NOTE — Evaluation (Signed)
Physical Therapy Evaluation Patient Details Name: Pamela Choi MRN: [id number] DOB: 1930-12-23 Today's Date: 09/23/2019   History of Present Illness  85 yo female presenting with left side facial droop, left neglect, and slurred speech. MRI showing acute ischemic nonhemorrhagic infarct involving right basal ganglia and right occipital cortex. PMH including dementia, hypertension, hyperlipidemia, depression and anxiety.  Clinical Impression   Pt admitted with above diagnosis. Patient's prior functional status is not clear (pt with dementia), however appears she was mobile based on use of RLE and anticipate she has had a decline in status as the left hemiparesis and neglect are new. Today she was primarily limited by back pain and inconsistent pain in LUE (?positional due to IV).  Pt currently with functional limitations due to the deficits listed below (see PT Problem List). Pt will benefit from skilled PT to increase their independence and safety with mobility to allow discharge to the venue listed below.       Follow Up Recommendations SNF;Supervision/Assistance - 24 hour    Equipment Recommendations  None recommended by PT    Recommendations for Other Services       Precautions / Restrictions Precautions Precautions: Fall Restrictions Weight Bearing Restrictions: No      Mobility  Bed Mobility Overal bed mobility: Needs Assistance Bed Mobility: Rolling Rolling: Total assist;Max assist(max assist to left; total to rt)         General bed mobility comments: Pt began calling out in pain when assisted onto her left side to intiate OOB; returned to supine and +2 repositioned her in semi-fowlers position with no further expressions of pain; assisted to roll to rt for RN to give tylenol suppository   Transfers                 General transfer comment: unable due to pain  Ambulation/Gait                Stairs            Wheelchair Mobility     Modified Rankin (Stroke Patients Only) Modified Rankin (Stroke Patients Only) Pre-Morbid Rankin Score: (unknown; pt cannot report) Modified Rankin: Severe disability     Balance                                             Pertinent Vitals/Pain Pain Assessment: Faces Faces Pain Scale: Hurts whole lot Pain Location: LUE when straightened initially; back Pain Descriptors / Indicators: Constant;Discomfort;Moaning Pain Intervention(s): Limited activity within patient's tolerance;Monitored during session;Repositioned;Patient requesting pain meds-RN notified;RN gave pain meds during session    Pamela Choi expects to be discharged to:: Skilled nursing facility                 Additional Comments: Per chart review, pt residing at [place] home.    Prior Function Level of Independence: Needs assistance               Hand Dominance        Extremity/Trunk Assessment   Upper Extremity Assessment Upper Extremity Assessment: Defer to OT evaluation    Lower Extremity Assessment Lower Extremity Assessment: RLE deficits/detail;LLE deficits/detail RLE Deficits / Details: AAROM WFL (assist throughout due to weakness and lethargy); able to extend hip/knee at 3+/5 LLE Deficits / Details: AAROM WFL; 0/5 hip flexion; hip/knee extension together 2+       Communication  Communication: HOH;Other (comment)(difficult to understand at times (low volume))  Cognition Arousal/Alertness: Lethargic Behavior During Therapy: Flat affect Overall Cognitive Status: No family/caregiver present to determine baseline cognitive functioning                                 General Comments: h/o dementia      General Comments      Exercises     Assessment/Plan    PT Assessment Patient needs continued PT services  PT Problem List Decreased strength;Decreased activity tolerance;Decreased balance;Decreased mobility;Decreased  cognition;Decreased knowledge of use of DME;Decreased safety awareness;Impaired tone;Pain       PT Treatment Interventions DME instruction;Functional mobility training;Therapeutic activities;Balance training;Neuromuscular re-education;Cognitive remediation;Patient/family education    PT Goals (Current goals can be found in the Care Plan section)  Acute Rehab PT Goals Patient Stated Goal: unable to state PT Goal Formulation: Patient unable to participate in goal setting Time For Goal Achievement: 10/07/19 Potential to Achieve Goals: Fair    Frequency Min 2X/week   Barriers to discharge        Co-evaluation               AM-PAC PT "6 Clicks" Mobility  Outcome Measure Help needed turning from your back to your side while in a flat bed without using bedrails?: Total Help needed moving from lying on your back to sitting on the side of a flat bed without using bedrails?: Total Help needed moving to and from a bed to a chair (including a wheelchair)?: Total Help needed standing up from a chair using your arms (e.g., wheelchair or bedside chair)?: Total Help needed to walk in hospital room?: Total Help needed climbing 3-5 steps with a railing? : Total 6 Click Score: 6    End of Session   Activity Tolerance: Patient limited by pain Patient left: in bed;with call bell/phone within reach;with bed alarm set Nurse Communication: Other (comment)(incr pain with mobility; esp back and LUE) PT Visit Diagnosis: Muscle weakness (generalized) (M62.81);Hemiplegia and hemiparesis Hemiplegia - Right/Left: Left Hemiplegia - dominant/non-dominant: Non-dominant Hemiplegia - caused by: Cerebral infarction    Time: 1126-1150 PT Time Calculation (min) (ACUTE ONLY): 24 min   Charges:   PT Evaluation $PT Eval Moderate Complexity: 1 Mod            [employer], PT      [place] P Caide Campi 09/23/2019, 1:40 PM

## 2019-09-23 NOTE — Progress Notes (Signed)
Modified Barium Swallow Progress Note  Patient Details  Name: Pamela Choi MRN: [id number] Date of Birth: Oct 30, 1930  Today's Date: 09/23/2019  Modified Barium Swallow completed.  Full report located under Chart Review in the Imaging Section.  Brief recommendations include the following:  Clinical Impression  [name] is an 85 year old female who presents with mild-moderate oropharyngeal dysphagia with resultant silent, deep laryngeal penetration to the level of the vocal cords and possible aspiration with thin liquid,  and trace, transient laryngeal penetration of nectar-thick liquid.  Pt was very lethargic and visibly in pain throughout this study.  She was unable to rest her back against the chair, and required continuous physical assistance to position and maintain her head in a semi-neutral position.  Pt consumed trials of thin liquid (tsp/straw), nectar-thick liquid (straw), and puree.  Attempted honey-thick liquid, however, pt was unable to draw the liquid from a straw and she had poor bolus acceptance during assisted cup sip.  No laryngeal penetration or aspiration was observed with thin liquid via tsp or puree.  Oral phase was remarkable for prolonged AP transport, reduced lingual strength resulting in oral residue and reduced lingual control resulting in premature spillage to the valleculae and pyriform sinuses.  Pharyngeal phase was remarkable for reduced laryngeal closure resulting in penetration/aspiration before the swallow.  Additionally, pt presented with decreased BOT retraction and reduced hyolaryngeal elevation/excursion resulting in vallecular residue.  Recommend Dysphagia 1 (puree) solids and nectar-thick liquids with the following precautions: 1) Small bites/sips 2) Slow rate of intake 3) Sit upright as possible 4) Only feed when awake/alert.  Pt will benefit from 1:1 assistance and/or full supervision with po intake to monitor and encourage use of compensatory  strategies.    Swallow Evaluation Recommendations       SLP Diet Recommendations: Dysphagia 1 (Puree) solids;Nectar thick liquid   Liquid Administration via: Cup;Straw   Medication Administration: Crushed with puree   Supervision: Full supervision/cueing for compensatory strategies;Full assist for feeding   Compensations: Minimize environmental distractions;Slow rate;Small sips/bites   Postural Changes: Seated upright at 90 degrees   Oral Care Recommendations: Oral care BID   Other Recommendations: Order thickener from [place], M.S., [place] Office: [phone]   Elvia Collum [place] Saber 09/23/2019,3:11 PM

## 2019-09-23 NOTE — Progress Notes (Signed)
STROKE TEAM PROGRESS NOTE   HISTORY OF PRESENT ILLNESS (per record) Pamela Choi is an 85 y.o. female nursing home resident presenting to the ED with acute onset of left hemiplegia. LKN was at 5 PM, which is when the symptoms were witnessed to have started by nursing home staff. When EMS arrived, the patient continued to be weak.   CT head in the ED revealed no acute hemorrhage or hypodensity. A large calcified right MCA fusiform aneurysm was noted, precluding tPA administration due to high risk of aneurysmal rupture. She was not a candidate for thrombectomy due to pre-existing level of disability requiring 24 hour care and supervision at the SNF.   LSN: 5:00 PM tPA Given: No: A large calcified right MCA fusiform aneurysm was noted, precluding tPA administration due to high risk of aneurysmal rupture.    INTERVAL HISTORY Her son is not at bedside today, I spoke with him yesterday. She has dementia. Reviewed images with son, answered questions. Neuro stable today, appeared annoyed I made her open her eyes and talk to me today.    OBJECTIVE Vitals:   09/23/19 0328 09/23/19 0808 09/23/19 1205 09/23/19 1607  BP: 125/86 (!) 145/81 (!) 149/87 (!) 162/92  Pulse: 82 79 73 80  Resp: 18 13 14 20   Temp: 98.8 F (37.1 C) (!) 97.5 F (36.4 C) 98.3 F (36.8 C) 97.7 F (36.5 C)  TempSrc: Oral Oral Axillary Oral  SpO2: 100% 97% 98% 99%  Weight:      Height:        CBC:  Recent Labs  Lab 09/21/19 1853 09/21/19 1857 09/22/19 0054  WBC 6.4  --  8.1  NEUTROABS 3.8  --   --   HGB 13.3 13.9 13.3  HCT 40.9 41.0 40.1  MCV 103.5*  --  102.3*  PLT 231  --  [id number]    Basic Metabolic Panel:  Recent Labs  Lab 09/21/19 1853 09/21/19 1857 09/22/19 0054  NA 141 142  --   K 3.8 3.7  --   CL 107 107  --   CO2 25  --   --   GLUCOSE 108* 101*  --   BUN 24* 26*  --   CREATININE 1.01* 0.80 0.85  CALCIUM 9.4  --   --     Lipid Panel:     Component Value Date/Time   CHOL 176 09/22/2019  0354   TRIG 66 09/22/2019 0354   HDL 47 09/22/2019 0354   CHOLHDL 3.7 09/22/2019 0354   VLDL 13 09/22/2019 0354   LDLCALC 116 (H) 09/22/2019 0354   HgbA1c:  Lab Results  Component Value Date   HGBA1C 5.3 09/22/2019   Urine Drug Screen: No results found for: LABOPIA, COCAINSCRNUR, LABBENZ, AMPHETMU, THCU, LABBARB  Alcohol Level No results found for: ETH  IMAGING  Dg Skull 1-3 Views 09/22/2019 IMPRESSION:  Tiny linear metallic density projects over the posterior midline scalp, external to the patient on cross-sectional imaging., likely hair-tie, correlate with visual inspection and remove prior to MR scanning. No other metallic foreign body.   Dg Chest 1 View 09/22/2019 IMPRESSION:  1. No radiopaque foreign body or metallic implant.  2. Low lung volumes with associated mild bibasilar atelectasis. No other active cardiopulmonary disease.   Dg Abdomen 1 View 09/22/2019 IMPRESSION:  1. No metallic foreign body or hardware is evident.  2. Diffuse gaseous distention of the bowel particularly the colon, consistent with ileus.    Mr Angio Head Wo Contrast 09/22/2019 IMPRESSION:  MRI HEAD  1. 18 mm acute ischemic nonhemorrhagic perforator type infarct involving the right basal ganglia.  2. Additional 8 mm acute ischemic nonhemorrhagic right occipital cortical infarct.  3. Underlying advanced age-related cerebral atrophy with chronic small vessel ischemic disease in extensive chronic ischemic changes as above.   MRA HEAD IMPRESSION:  1. Stable intracranial MRA as compared to 2018. 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  2. Diffuse small vessel atheromatous irregularity throughout the intracranial circulation. No proximal high-grade or correctable stenosis.   Ct Head Code Stroke Wo Contrast 09/21/2019 IMPRESSION:  1. Extensive chronic ischemic changes throughout the brain as outlined above. No sign of acute infarction by CT.  2. Pronounced  atherosclerotic disease of the vessels at the base of the brain including a calcified fusiform aneurysm of the right middle cerebral artery measuring up to 8 mm in size.  3. ASPECTS is 10.   Vas US Carotid (at [facility] Only) 09/22/2019 Summary:  Right Carotid: Unable to visualize ICA and ECA secondary to patient's refusal to turn head.  Left Carotid: The extracranial vessels were near-normal with only minimal wall thickening or plaque.  Vertebrals:  Bilateral vertebral arteries demonstrate antegrade flow.  Subclavians: Normal flow hemodynamics were seen in bilateral subclavian arteries.  Preliminary     Transthoracic Echocardiogram  00/00/2020  FINDINGS  Left Ventricle: Left ventricular ejection fraction, by visual estimation, is 60 to 65%. The left ventricle has normal function. No evidence of left ventricular regional wall motion abnormalities. There is no left ventricular hypertrophy. Normal left  ventricular size. Spectral Doppler shows Left ventricular diastolic Doppler parameters are consistent with impaired relaxation pattern of LV diastolic filling. Normal left ventricular filling pressures.  Right Ventricle: The right ventricular size is normal. No increase in right ventricular wall thickness. Global RV systolic function is has normal systolic function.  Left Atrium: Left atrial size was normal in size.  Right Atrium: Right atrial size was normal in size  Pericardium: There is no evidence of pericardial effusion.  Mitral Valve: The mitral valve is normal in structure. No evidence of mitral valve stenosis by observation. Trace mitral valve regurgitation.  Tricuspid Valve: The tricuspid valve is normal in structure. Tricuspid valve regurgitation was not visualized by color flow Doppler.  Aortic Valve: The aortic valve is normal in structure. Aortic valve regurgitation is mild by color flow Doppler. The aortic valve is structurally normal, with no evidence of sclerosis or  stenosis.  Pulmonic Valve: The pulmonic valve was normal in structure. Pulmonic valve regurgitation is not visualized by color flow Doppler.  Aorta: The aortic root, ascending aorta and aortic arch are all structurally normal, with no evidence of dilitation or obstruction.  Venous: The inferior vena cava is normal in size with greater than 50% respiratory variability, suggesting right atrial pressure of 3 mmHg.  IAS/Shunts: No atrial level shunt detected by color flow Doppler. Agitated saline contrast was given intravenously to evaluate for intracardiac shunting. Saline contrast bubble study was negative, with no evidence of any interatrial shunt. No ventricular  septal defect is seen or detected. There is no evidence of an atrial septal defect.   PHYSICAL EXAM Blood pressure (!) 162/92, pulse 80, temperature 97.7 F (36.5 C), temperature source Oral, resp. rate 20, height 5\' 5"  (1.651 m), weight 57.6 kg, SpO2 99 %.  Exam:                Baseline dementia, she is lethargic, dysarthric, opens eyes to her son's  voice then she is slightly interactive Cognition:    Dementia, oriented to self, knows her son    language dysarthric, following simple commands such as smile    Cranial Nerves: PERRL, impaired upgze otherwise eomi blinking to threat bilaterally, right gaze, left facial droop. The palate elevates in the midline. Hearing impaired. Voice is hypophonic. Shoulder shrug not cooperative. The tongue has normal motion without fasciculations,midline.    Coordination:  Not cooperative  Motor Observation:    No abnormal movements Tone:    Increased x 4   Strength:    Left arm 0/5, right arm spontaneously and purposeful and antigravity, bilateral legs 2/5.      Sensation: does not withdraw left arm, otherwise withdraw to pain      ASSESSMENT/PLAN Ms. KHAZA ZIMPFER is a 85 y.o. female with history of mild dementia, seizure hx, warfarin listed as med PTA, anxiety, depression,  hypothyroidism, Hld, Htn, and hx of breast cancer presenting with left hemiplegia.  She did not receive IV t-PA due to a large calcified right MCA fusiform aneurysm was noted, precluding tPA administration due to high risk of aneurysmal rupture.  Stroke: Rt basal ganglia and Rt occipital infarcts. Unclear why patient is on Coumadin, may be afib? If so, this may explain strokes. Also possible given extensive microvascular disease may be small vessel strokes.   Code Stroke CT Head - Extensive chronic ischemic changes throughout the brain as outlined above. No sign of acute infarction by CT. 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 ASPECTS is 10.  MRI head - 18 mm acute ischemic nonhemorrhagic perforator type infarct involving the right basal ganglia. Additional 8 mm acute ischemic nonhemorrhagic right occipital cortical infarct.   MRA head - Fusiform aneurysmal dilatation of the right ICA terminus, stable.   CTA H&N - not ordered  CT Perfusion - not ordered  Carotid Doppler - normal except not able to visualize right ICA and ECA secondary to patient's refusal to turn head.   2D Echo - No shunt, no thrombus, nml EF  DO not recommend any further work up  [employer] Virus 2  - negative  LDL - 116  HgbA1c - 5.3  UDS - not ordered  VTE prophylaxis - Lovenox Diet  Diet Order            DIET - DYS 1 Room service appropriate? No; Fluid consistency: Nectar Thick  Diet effective now              aspirin 81 mg daily and warfarin daily prior to admission, now on aspirin 325 mg daily (Unclear if was on Warfarin, doesn't appear so). Recommend ASA 81mg  at discharge.  Patient counseled to be compliant with her antithrombotic medications  Ongoing aggressive stroke risk factor management  Therapy recommendations:  SNF  Disposition:   Pending  Hypertension  Home BP meds: Norvasc  Current BP meds: none  Stable . Permissive hypertension (OK if < 220/120) but gradually normalize in 5-7 days . Long-term BP goal normotensive  Hyperlipidemia  Home Lipid lowering medication:  Pravachol 40 mg daily  LDL 116, goal < 70  Current lipid lowering medication: Pravachol 40 mg daily -> change to Lipitor 40 mg daily  Continue statin at discharge  Other Stroke Risk Factors  Advanced age  Former cigarette smoker - quit  Family hx stroke (brothers)  Other  Active Problems  Seizure hx ? - Keppra ; Depakote   Warfarin listed as home med but INR 1.0 on admission (? Indication). Dr. Nevada Crane to call her living facility to find out.  Likely she was not on it.  8 mm fusiform aneurysmal dilatation of the right ICA terminus,  Not able to visualize right ICA and ECA secondary to patient's refusal to turn head may need to consider CTA or MRA neck.  NPO -> ASA PR   Hospital day # 2  Stroke will sign off at this time.  Personally examined patient and images, and have participated in and made any corrections needed to history, physical, neuro exam,assessment and plan as stated above.  I have personally obtained the history, evaluated lab date, reviewed imaging studies and agree with radiology interpretations.    Sarina Ill, MD Stroke Neurology   A total of 25 minutes was spent for the care of this patient, spent on counseling patient and family on different diagnostic and therapeutic options, counseling and coordination of care, riskd ans benefits of management, compliance, or risk factor reduction and education.     Marland Kitchen

## 2019-09-23 NOTE — Progress Notes (Signed)
PROGRESS NOTE  Pamela Choi [id number] DOB: 31-Oct-1930 DOA: 09/21/2019 PCP: Lucretia Kern, DO  HPI/Recap of past 24 hours: Pamela Choi is a 85 y.o. female with medical history significant of dementia, hypertension, hyperlipidemia, depression and anxiety who presented as a CODE STROKE with left-sided neglect, left-sided facial drooping and drooling.  Patient reportedly was found by nursing staff at 1700 today with slurred speech, drooping and left-sided weakness.  Patient has severe dementia and incoherent speech at baseline so son at bedside was able to provide some history.  He states that patient has been in the nursing home for 3 years ever since fall and has progressively had worsening memory issues.  She is mostly non-verbal and sometimes able to answer questions although speech is mostly incoherent.  She requires assistance with all activities of daily living at nursing facility including feeding.  Patient was able to follow some simple commands but incoherent when attempting to answer questions.  ED Course: She was afebrile and mildly hypertensive with a systolic [id number] over [id number].  CBC showed no leukocytosis or anemia.  BMP showed mildly elevated creatinine of 1.01 which is around her baseline.  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  She was evaluated by neurologist Dr. Cheral Marker and she was deemed not a candidate for TPA secondary to high risk of aneurysmal rupture.  Also not a candidate for thrombectomy due to pre-existing disability requiring 24-hour care.  09/23/19: Patient was seen and examined at bedside this morning.  Somnolent but easily arousable to voices.  Denies any pain.  Seen by speech therapist with recommendation for n.p.o. due to severe aspiration risk.  Seen by OT with recommendation for SNF with 24-hour  supervision/assistance.   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 PT OT, speech therapy, Aspirin 325, Lipitor, LDL 110, goal less than 70 A1c is 5.3, goal less than 7.0 Twelve-lead EKG on admission sinus rhythm Seen by neurology, following 2D echo pending carotid Doppler ultrasound: Summary: Right Carotid: Unable to visualize ICA and ECA secondary to patient's refusal to                turn head.  Left Carotid: The extracranial vessels were near-normal with only minimal wall               thickening or plaque. Ongoing permissive hypertension; continue to hold off home antihypertensives, amlodipine 5 mg daily Neurochecks every 4 hours  Dysphagia likely secondary to CVA Evaluated by speech therapist with recommendation for n.p.o. Continue aspiration precautions  Hypertension Ongoing permissive hypertension Continue to hold off amlodipine 5 milligrams daily  Late stage dementia Stable Reorient as necessary  Seizure disorder Due to n.p.o. We will switch Keppra to IV 250 mg twice daily, Depakote IV 250 mg twice daily  Hyperlipidemia Resume pravastatin when passes swallow evaluation  Hypothyroidism Due to n.p.o. Switch to IV levothyroxine 12.5 mcg daily  Chronic anxiety/depression Resume Lexapro when able to swallow safely   Medication reconciliation Unclear why patient is on Coumadin Will call SNF facility  Disposition plan: Patient is currently not appropriate for discharge due to persistent symptomatology requiring further  evaluation.  Awaiting completion of 2D echo and reevaluation by speech therapy.  DVT prophylaxis:.Lovenox subcu daily. Code Status:DNR Family Communication: None  at bedside.[phone] Also can reach his wife, Katharine Look, if he is unavailable [phone]   Consults called: Neurology      Objective: Vitals:   09/22/19 2015 09/22/19 2334 09/23/19 0328 09/23/19 0808  BP: (!) 144/105 (!) 142/84 125/86 (!) 145/81  Pulse: 90 86 82 79  Resp: 18 19 18 13   Temp: (!) 97.5 F (36.4 C) 98.3 F (36.8 C) 98.8 F (37.1 C) (!) 97.5 F (36.4 C)  TempSrc: Oral Axillary Oral Oral  SpO2: 97% 96% 100% 97%  Weight:      Height:        Intake/Output Summary (Last 24 hours) at 09/23/2019 1045 Last data filed at 09/23/2019 0602 Gross per 24 hour  Intake 607.58 ml  Output 550 ml  Net 57.58 ml   Filed Weights   09/22/19 1306  Weight: 57.6 kg    Exam:  . General: 85 y.o. year-old female frail-appearing no acute distress.  Somnolent but easily arousable to voices. . Cardiovascular: Regular rate and rhythm no rubs or gallops no JVD or thyromegaly noted.   Marland Kitchen Respiratory: Clear to auscultation no wheezes no rales.  Poor inspiratory effort.   . Abdomen: Soft nontender nondistended normal bowel sounds present.  . Musculoskeletal: No lower extremity edema.  2 out of 4 pulses in all 4 extremities. Marland Kitchen Psychiatry: Unable to assess mood due to somnolence.   Data Reviewed: CBC: Recent Labs  Lab 09/21/19 1853 09/21/19 1857 09/22/19 0054  WBC 6.4  --  8.1  NEUTROABS 3.8  --   --   HGB 13.3 13.9 13.3  HCT 40.9 41.0 40.1  MCV 103.5*  --  102.3*  PLT 231  --  [id number]   Basic Metabolic Panel: Recent Labs  Lab 09/21/19 1853 09/21/19 1857 09/22/19 0054  NA 141 142  --   K 3.8 3.7  --   CL 107 107  --   CO2 25  --   --   GLUCOSE 108* 101*  --   BUN 24* 26*  --   CREATININE 1.01* 0.80 0.85  CALCIUM 9.4  --   --    GFR: Estimated Creatinine Clearance: 42 mL/min (by C-G formula based on SCr of 0.85 mg/dL). Liver Function Tests: Recent Labs  Lab 09/21/19 1853  AST 18  ALT 10  ALKPHOS 98  BILITOT 0.2*  PROT 7.1  ALBUMIN 3.5   No results  for input(s): LIPASE, AMYLASE in the last 168 hours. No results for input(s): AMMONIA in the last 168 hours. Coagulation Profile: Recent Labs  Lab 09/21/19 1853  INR 1.0   Cardiac Enzymes: No results for input(s): CKTOTAL, CKMB, CKMBINDEX, TROPONINI in the last 168 hours. BNP (last 3 results) No results for input(s): PROBNP in the last 8760 hours. HbA1C: Recent Labs    09/22/19 0354  HGBA1C 5.3   CBG: Recent Labs  Lab 09/21/19 1852 09/22/19 1718 09/22/19 2356  GLUCAP 101* 94 119*   Lipid Profile: Recent Labs    09/22/19 0354  CHOL 176  HDL 47  LDLCALC 116*  TRIG 66  CHOLHDL 3.7   Thyroid Function Tests: No results for input(s): TSH, T4TOTAL, FREET4, T3FREE, THYROIDAB in the last 72 hours. Anemia Panel: No results for input(s): VITAMINB12, FOLATE, FERRITIN, TIBC, IRON, RETICCTPCT in the last 72 hours. Urine analysis:    Component Value Date/Time   COLORURINE YELLOW 03/04/2017 1645  APPEARANCEUR HAZY (A) 03/04/2017 1645   LABSPEC 1.028 03/04/2017 1645   PHURINE 5.0 03/04/2017 1645   GLUCOSEU NEGATIVE 03/04/2017 1645   HGBUR NEGATIVE 03/04/2017 [place] 03/04/2017 1645   KETONESUR NEGATIVE 03/04/2017 1645   PROTEINUR NEGATIVE 03/04/2017 1645   NITRITE NEGATIVE 03/04/2017 1645   LEUKOCYTESUR NEGATIVE 03/04/2017 1645   Sepsis Labs: @LABRCNTIP (procalcitonin:4,lacticidven:4)  ) Recent Results (from the past 240 hour(s))  SARS CORONAVIRUS 2 (TAT 6-24 HRS) Nasopharyngeal Nasopharyngeal Swab     Status: None   Collection Time: 09/21/19  9:50 PM   Specimen: Nasopharyngeal Swab  Result Value Ref Range Status   SARS Coronavirus 2 NEGATIVE NEGATIVE Final    Comment: (NOTE) SARS-CoV-2 target nucleic acids are NOT DETECTED. The SARS-CoV-2 RNA is generally detectable in upper and lower respiratory specimens during the acute phase of infection. Negative results do not preclude SARS-CoV-2 infection, do not rule out co-infections with other  pathogens, and should not be used as the sole basis for treatment or other patient management decisions. Negative results must be combined with clinical observations, patient history, and epidemiological information. The expected result is Negative. Fact Sheet for Patients: SugarRoll.be Fact Sheet for Healthcare Providers: https://www.woods-mathews.com/ This test is not yet approved or cleared by the [place] FDA and  has been authorized for detection and/or diagnosis of SARS-CoV-2 by FDA under an Emergency Use Authorization (EUA). This EUA will remain  in effect (meaning this test can be used) for the duration of the COVID-19 declaration under Section 56 4(b)(1) of the Act, 21 U.S.C. section 360bbb-3(b)(1), unless the authorization is terminated or revoked sooner. Performed at [facility], [place] [address], [place], [place] [address]   MRSA PCR Screening     Status: None   Collection Time: 09/22/19  1:06 PM   Specimen: Nasal Mucosa; Nasopharyngeal  Result Value Ref Range Status   MRSA by PCR NEGATIVE NEGATIVE Final    Comment:        The GeneXpert MRSA Assay (FDA approved for NASAL specimens only), is one component of a comprehensive MRSA colonization surveillance program. It is not intended to diagnose MRSA infection nor to guide or monitor treatment for MRSA infections. Performed at [facility], [place] [address], [place], [place] [address]       Studies: No results found.  Scheduled Meds: .  stroke: mapping our early stages of recovery book   Does not apply Once  . aspirin  300 mg Rectal Daily  . atorvastatin  40 mg Oral q1800  . enoxaparin (LOVENOX) injection  40 mg Subcutaneous Q24H  . escitalopram  10 mg Oral Daily  . famotidine  20 mg Oral BID  . levothyroxine  37.5 mcg Oral QAC breakfast  . mouth rinse  15 mL Mouth Rinse BID  . senna-docusate  2 tablet Oral BID  . sodium chloride flush  3 mL  Intravenous Once    Continuous Infusions: . dextrose 5% lactated ringers 50 mL/hr at 09/22/19 1645     LOS: 2 days     Kayleen Memos, MD [facility] Pager [phone]  If 7PM-7AM, please contact night-coverage [email or web address] Password [email or web address] 09/23/2019, 10:45 AM

## 2019-09-23 NOTE — Progress Notes (Signed)
  Speech Language Pathology Treatment: Dysphagia  Patient Details Name: Pamela Choi MRN: [id number] DOB: 02/20/1930 Today's Date: 09/23/2019 Time: [id number] SLP Time Calculation (min) (ACUTE ONLY): 8 min  Assessment / Plan / Recommendation Clinical Impression  Pt was encountered awake and moaning in pain upon SLP arrival (RN administered medication).  Pt was agreeable to ST treatment session despite discomfort.  She was observed with trials of thin liquid, nectar-thick liquid, honey-thick liquid, and puree.  Pt exhibited prolonged AP transport and decreased bolus acceptance with all trials, improving throughout the tx session. She was additionally observed to have an audible swallow and suspected delayed swallow with all consistencies.  Pt presented with a delayed cough following thin liquid and puree trials, and an immediate cough following nectar-thick liquid trials.  No clinical s/sx of aspiration were observed with honey thick liquid.  Recommend an MBS to further evaluate swallow function and help determine the safest/least restrictive diet.     HPI HPI: Patient is an 76 y.,o. female with PMH: severe dementia, HTN, HLD, depression, anxiety, who presented as code stroke with left sided neglect, facial drooping and drooling. She has been living in a SNF for the past 3 years and per chart review, son had reported her speech was incoherent at baseline and memory issues have been worsening. Premorbidly, she requires assistance with all ADL's including feeding. MRI revealed 18 mm acute ischemic nonhemmorhagic perforator type infarct involving right basal ganglia and 56mm acute ischemic nonhemorrhagic right occiptal cortical infarct, advanced age related cerebral atrophy with chronic SVD.t      SLP Plan  MBS       Recommendations  Diet recommendations: NPO                Oral Care Recommendations: Oral care QID;Staff/trained caregiver to provide oral care Follow up Recommendations:  Skilled Nursing facility SLP Visit Diagnosis: Dysphagia, unspecified (R13.10) Plan: MBS       Bretta Bang, M.S., [place] Office: [phone]               [place] 09/23/2019, 1:26 PM

## 2019-09-23 NOTE — Progress Notes (Signed)
  Echocardiogram 2D Echocardiogram has been performed.  Pamela Choi 09/23/2019, 11:20 AM

## 2019-09-24 MED ORDER — LEVOTHYROXINE SODIUM 75 MCG PO TABS
37.5000 ug | ORAL_TABLET | Freq: Every day | ORAL | Status: DC
  Administered 2019-09-24 – 2019-09-26 (×3): 37.5 ug via ORAL
  Filled 2019-09-24 (×3): qty 1

## 2019-09-24 NOTE — Progress Notes (Signed)
  Speech Language Pathology Treatment: Dysphagia  Patient Details Name: Pamela Choi MRN: [id number] DOB: [birth year] Today's Date: 09/24/2019 Time: [id number] SLP Time Calculation (min) (ACUTE ONLY): 26 min  Assessment / Plan / Recommendation Clinical Impression  Education and clinical observation provided with pt and daughter, Butch Penny (pt with h/o dementia). Reviewed MBS results and recommendations for nectar thick liquids and Dys 1 (puree) texture and demonstrated thickening liquid to nectar. Reviewed general swallow precautions including positioning, pt alertness and assist needed. Hand over hand cup sips nectar grape juice with delayed mild throat clear once with larger sip and one with smaller. Did not attempt higher texture given her drowsiness. No overt concerns and recommend continue with recommendations of Dys 1 (puree), nectar thick and follow up ST at SNF for safety and repeat MBS when appropriate.     HPI HPI: Patient is an 44 y.,o. female with PMH: severe dementia, HTN, HLD, depression, anxiety, who presented as code stroke with left sided neglect, facial drooping and drooling. She has been living in a SNF for the past 3 years and per chart review, son had reported her speech was incoherent at baseline and memory issues have been worsening. Premorbidly, she requires assistance with all ADL's including feeding. MRI revealed 18 mm acute ischemic nonhemmorhagic perforator type infarct involving right basal ganglia and 24mm acute ischemic nonhemorrhagic right occiptal cortical infarct, advanced age related cerebral atrophy with chronic SVD.t      SLP Plan  Continue with current plan of care       Recommendations  Diet recommendations: Nectar-thick liquid;Dysphagia 1 (puree) Liquids provided via: Cup;Straw Medication Administration: Crushed with puree Supervision: Staff to assist with self feeding;Full supervision/cueing for compensatory strategies Compensations: Minimize  environmental distractions;Slow rate;Small sips/bites Postural Changes and/or Swallow Maneuvers: Seated upright 90 degrees                Oral Care Recommendations: Oral care BID Follow up Recommendations: Skilled Nursing facility SLP Visit Diagnosis: Dysphagia, oropharyngeal phase (R13.12) Plan: Continue with current plan of care                       Houston Siren 09/24/2019, 12:01 PM  Orbie Pyo Colvin Caroli.Ed Risk analyst [phone] Office [phone]

## 2019-09-24 NOTE — Progress Notes (Signed)
Pt Coughing with nectar thick liquids, downgraded to honey thick.   Pamela Choi

## 2019-09-24 NOTE — Progress Notes (Signed)
PROGRESS NOTE  Pamela Choi LKG:[id number] DOB: 1930/06/03 DOA: 09/21/2019 PCP: Lucretia Kern, DO  HPI/Recap of past 24 hours: Pamela Choi is a 85 y.o. female with medical history significant of dementia, hypertension, hyperlipidemia, depression and anxiety who presented as a CODE STROKE with left-sided neglect, left-sided facial drooping and drooling.  Patient reportedly was found by nursing staff at 1700 today with slurred speech, drooping and left-sided weakness.  Patient has severe dementia and incoherent speech at baseline so son at bedside was able to provide some history.  He states that patient has been in the nursing home for 3 years ever since fall and has progressively had worsening memory issues.  She is mostly non-verbal and sometimes able to answer questions although speech is mostly incoherent.  She requires assistance with all activities of daily living at nursing facility including feeding.  Patient was able to follow some simple commands but incoherent when attempting to answer questions.  ED Course: She was afebrile and mildly hypertensive with a systolic 664Q over 034.  CBC showed no leukocytosis or anemia.  BMP showed mildly elevated creatinine of 1.01 which is around her baseline.  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  She was evaluated by neurologist Dr. Cheral Marker and she was deemed not a candidate for TPA secondary to high risk of aneurysmal rupture.  Also not a candidate for thrombectomy due to pre-existing disability requiring 24-hour care.  09/24/19: Patient was seen and examined at bedside.  She is somnolent but easily arousable to voice.  Minimally interactive.  PT OT assessed and recommended SNF.  CSW consulted to assist with SNF placement.  Assessment/Plan: Active Problems:   Hemineglect  Acute ischemic  nonhemorrhagic infarct involving the right basal ganglia with left hemiplegia Per MRI Presented as a code stroke; TPA not given due to a large calcified right MCA fusiform aneurysm noted precluding TPA administration due to high risk of aneurysmal rupture. PT OT assessment recommending SNF CSW consulted to assist with placement Speech therapy recommended dysphagia 1 diet, pure with nectar thick liquids. Continue aspirin 325, Lipitor 80 mg daily LDL 110, goal less than 70 A1c is 5.3, goal less than 7.0 Twelve-lead EKG on admission sinus rhythm Seen by neurology, following 2D echo results done on 09/23/2019 showed normal LVEF 60 to 65% with no evidence of atrial septal defect or PFO. carotid Doppler ultrasound: Summary: Right Carotid: Unable to visualize ICA and ECA secondary to patient's refusal to                turn head.  Left Carotid: The extracranial vessels were near-normal with only minimal wall               thickening or plaque. Ongoing permissive hypertension; continue to hold off home antihypertensives, amlodipine 5 mg daily Continue neurochecks every 4 hours  Dysphagia likely secondary to acute ischemic CVA Evaluated by speech therapist with recommendation for dysphagia 1 diet, pure with nectar thick liquids Continue aspiration precautions  Hypertension Blood pressure is at goal Not on any antihypertensives. Continue to hold off amlodipine 5 milligrams daily  Late stage dementia Stable Reorient as necessary  Seizure disorder Continue Keppra to IV 250 mg twice daily, Depakote IV 250 mg twice daily  Hyperlipidemia LDL 116 on 09/22/2019 Goal LDL less than 70 Continue pravastatin   Hypothyroidism Continue IV levothyroxine 12.5 mcg  daily  Chronic anxiety/depression Continue Lexapro when able to swallow safely   Medication reconciliation Unclear why patient is on Coumadin Will call SNF facility  Disposition plan: Patient is currently not appropriate for  discharge due to persistent symptomatology requiring further evaluation.  Awaiting completion of 2D echo and reevaluation by speech therapy.  DVT prophylaxis:.Lovenox subcu daily. Code Status:DNR Family Communication: None at bedside.[phone] Also can reach his wife, Katharine Look, if he is unavailable [phone]   Consults called: Neurology      Objective: Vitals:   09/23/19 1607 09/23/19 2017 09/23/19 2311 09/24/19 0356  BP: (!) 162/92 (!) 145/84 (!) 126/97 132/82  Pulse: 80 93 96 92  Resp: '20 18 18 18  '$ Temp: 97.7 F (36.5 C) 98.4 F (36.9 C) 97.6 F (36.4 C) 98.9 F (37.2 C)  TempSrc: Oral Oral Oral Axillary  SpO2: 99% 100% 99% 97%  Weight:      Height:        Intake/Output Summary (Last 24 hours) at 09/24/2019 0941 Last data filed at 09/24/2019 0510 Gross per 24 hour  Intake 2116.58 ml  Output 1150 ml  Net 966.58 ml   Filed Weights   09/22/19 1306  Weight: 57.6 kg    Exam:  . General: 85 y.o. year-old female frail-appearing no acute distress.  Somnolent but easily arousable to voices.  .  Cardiovascular: Regular rate and rhythm no rubs or gallops no JVD or thyromegaly noted.   Marland Kitchen Respiratory: Clear presentation no wheezes or rales.  Poor inspiratory effort.   . Abdomen: Soft nontender nondistended bowel sounds present. . Musculoskeletal: No lower extremity edema.  2 out of 4 pulses in all 4 extremities.   Marland Kitchen Psychiatry: Unable To Assess Mood Due To Somnolence.   Data Reviewed: CBC: Recent Labs  Lab 09/21/19 1853 09/21/19 1857 09/22/19 0054  WBC 6.4  --  8.1  NEUTROABS 3.8  --   --   HGB 13.3 13.9 13.3  HCT 40.9 41.0 40.1  MCV 103.5*  --  102.3*  PLT 231  --  287   Basic Metabolic Panel: Recent Labs  Lab 09/21/19 1853 09/21/19 1857 09/22/19 0054  NA 141 142  --   K 3.8 3.7  --   CL 107 107  --   CO2 25  --   --   GLUCOSE 108* 101*  --   BUN 24* 26*  --   CREATININE 1.01* 0.80 0.85  CALCIUM 9.4  --   --    GFR: Estimated  Creatinine Clearance: 42 mL/min (by C-G formula based on SCr of 0.85 mg/dL). Liver Function Tests: Recent Labs  Lab 09/21/19 1853  AST 18  ALT 10  ALKPHOS 98  BILITOT 0.2*  PROT 7.1  ALBUMIN 3.5   No results for input(s): LIPASE, AMYLASE in the last 168 hours. No results for input(s): AMMONIA in the last 168 hours. Coagulation Profile: Recent Labs  Lab 09/21/19 1853  INR 1.0   Cardiac Enzymes: No results for input(s): CKTOTAL, CKMB, CKMBINDEX, TROPONINI in the last 168 hours. BNP (last 3 results) No results for input(s): PROBNP in the last 8760 hours. HbA1C: Recent Labs    09/22/19 0354  HGBA1C 5.3   CBG: Recent Labs  Lab 09/21/19 1852 09/22/19 1718 09/22/19 2356  GLUCAP 101* 94 119*   Lipid Profile: Recent Labs    09/22/19 0354  CHOL 176  HDL 47  LDLCALC 116*  TRIG 66  CHOLHDL 3.7   Thyroid Function Tests: No results for input(s): TSH,  T4TOTAL, FREET4, T3FREE, THYROIDAB in the last 72 hours. Anemia Panel: No results for input(s): VITAMINB12, FOLATE, FERRITIN, TIBC, IRON, RETICCTPCT in the last 72 hours. Urine analysis:    Component Value Date/Time   COLORURINE YELLOW 03/04/2017 1645   APPEARANCEUR HAZY (A) 03/04/2017 1645   LABSPEC 1.028 03/04/2017 1645   PHURINE 5.0 03/04/2017 1645   GLUCOSEU NEGATIVE 03/04/2017 1645   HGBUR NEGATIVE 03/04/2017 1645   BILIRUBINUR NEGATIVE 03/04/2017 1645   KETONESUR NEGATIVE 03/04/2017 1645   PROTEINUR NEGATIVE 03/04/2017 1645   NITRITE NEGATIVE 03/04/2017 1645   LEUKOCYTESUR NEGATIVE 03/04/2017 1645   Sepsis Labs: '@LABRCNTIP'$ (procalcitonin:4,lacticidven:4)  ) Recent Results (from the past 240 hour(s))  SARS CORONAVIRUS 2 (TAT 6-24 HRS) Nasopharyngeal Nasopharyngeal Swab     Status: None   Collection Time: 09/21/19  9:50 PM   Specimen: Nasopharyngeal Swab  Result Value Ref Range Status   SARS Coronavirus 2 NEGATIVE NEGATIVE Final    Comment: (NOTE) SARS-CoV-2 target nucleic acids are NOT DETECTED. The  SARS-CoV-2 RNA is generally detectable in upper and lower respiratory specimens during the acute phase of infection. Negative results do not preclude SARS-CoV-2 infection, do not rule out co-infections with other pathogens, and should not be used as the sole basis for treatment or other patient management decisions. Negative results must be combined with clinical observations, patient history, and epidemiological information. The expected result is Negative. Fact Sheet for Patients: SugarRoll.be Fact Sheet for Healthcare Providers: https://www.woods-mathews.com/ This test is not yet approved or cleared by the [place] FDA and  has been authorized for detection and/or diagnosis of SARS-CoV-2 by FDA under an Emergency Use Authorization (EUA). This EUA will remain  in effect (meaning this test can be used) for the duration of the COVID-19 declaration under Section 56 4(b)(1) of the Act, 21 U.S.C. section 360bbb-3(b)(1), unless the authorization is terminated or revoked sooner. Performed at [facility], [place] [address] [place], [place] [address]   MRSA PCR Screening     Status: None   Collection Time: 09/22/19  1:06 PM   Specimen: Nasal Mucosa; Nasopharyngeal  Result Value Ref Range Status   MRSA by PCR NEGATIVE NEGATIVE Final    Comment:        The GeneXpert MRSA Assay (FDA approved for NASAL specimens only), is one component of a comprehensive MRSA colonization surveillance program. It is not intended to diagnose MRSA infection nor to guide or monitor treatment for MRSA infections. Performed at [facility], [place] [address], [place], [place] [address]       Studies: Dg Swallowing Func-speech Pathology  Result Date: 09/23/2019 Objective Swallowing Evaluation: Type of Study: MBS-Modified Barium Swallow Study  Patient Details Name: ROSELY FERNANDEZ MRN: [id number] Date of Birth: [birth year] Today's Date: 09/23/2019 Time:  SLP Start Time (ACUTE ONLY): 1408 -SLP Stop Time (ACUTE ONLY): 1435 SLP Time Calculation (min) (ACUTE ONLY): 27 min Past Medical History: Past Medical History: Diagnosis Date . Acute pericarditis 07/20/2015 . Allergy  . ANKLE SPRAIN, RIGHT 05/10/2007  Qualifier: Diagnosis of  By: Norma Fredrickson MD, Larena Glassman   . Anxiety and depression  . 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 Chicken pox  . Hyperlipidemia  . Hypertension  . Mild dementia ([place])   eval with neurology in 2014, mod SVD, declined tx . Osteopenia  . Pericardial effusion   a.  06/2015 Echo: EF 55%, mild LVH, mild AI, mild dil of Asc Ao (25m, 336m@ root), nl RV fxn, mild TR, small to mod circumferential pericardial effusion. . Pericarditis   a. 06/2015 . Plantar wart of right foot  . TOBACCO ABUSE, HX OF 11/08/2006  Qualifier: Diagnosis of  By: SiLinden DolinD, AaMarjory Lies Past Surgical History: Past Surgical History: Procedure Laterality Date . BREAST SURGERY   . BUNIONECTOMY    R foot . HAMMER TOE SURGERY    Right foot HPI: Patient is an [age].,o. female with PMH: severe dementia, HTN, HLD, depression, anxiety, who presented as code stroke with left sided neglect, facial drooping and drooling. She has been living in a SNF for the past 3 years and per chart review, son had reported her speech was incoherent at baseline and memory issues have been worsening. Premorbidly, she requires assistance with all ADL's including feeding. MRI revealed 18 mm acute ischemic nonhemmorhagic perforator type infarct involving right basal ganglia and 27m66mcute ischemic nonhemorrhagic right occiptal cortical infarct, advanced age related cerebral atrophy with chronic SVD.t  Subjective: Pt was encountered leaning foward with head in natural chin tuck.  Assessment / Plan / Recommendation CHL IP CLINICAL IMPRESSIONS  09/23/2019 Clinical Impression Pamela Choi is an [age]ar old female who presents with mild-moderate oropharyngeal dysphagia with resultant silent, deep laryngeal penetration to the level of the vocal cords and possible aspiration with thin liquid,  and trace, transient laryngeal penetration of nectar-thick liquid.  Pt was very lethargic and visibly in pain throughout this study.  She was unable to rest her back against the chair, and required continuous physical assistance to position and maintain her head in a semi-neutral position.  Pt consumed trials of thin liquid (tsp/straw), nectar-thick liquid (straw), and puree.  Attempted honey-thick liquid, however, pt was unable to draw the liquid from a straw and she had poor bolus acceptance during assisted cup sip.  No laryngeal penetration or aspiration was observed with thin liquid via tsp or puree.  Oral phase was remarkable for prolonged AP transport, reduced lingual strength resulting in oral residue and reduced lingual control resulting in premature spillage to the valleculae and pyriform sinuses.  Pharyngeal phase was remarkable for reduced laryngeal closure resulting in penetration/aspiration before the swallow.  Additionally, pt presented with decreased BOT retraction and reduced hyolaryngeal elevation/excursion resulting in vallecular residue.  Recommend Dysphagia 1 (puree) solids and nectar-thick liquids with the following precautions: 1) Small bites/sips 2) Slow rate of intake 3) Sit upright as possible 4) Only feed when awake/alert.  Pt will benefit from 1:1 assistance and/or full supervision with po intake to monitor and encourage use of compensatory strategies.  SLP Visit Diagnosis Dysphagia, oropharyngeal phase (R13.12) Attention and concentration deficit following -- Frontal lobe and executive function deficit following -- Impact on safety and function Moderate aspiration risk   CHL IP TREATMENT RECOMMENDATION 09/23/2019 Treatment Recommendations  Therapy as outlined in treatment plan below   Prognosis 09/23/2019 Prognosis for Safe Diet Advancement Fair Barriers to Reach Goals Cognitive deficits;Severity of deficits Barriers/Prognosis Comment -- CHL IP DIET RECOMMENDATION 09/23/2019 SLP Diet Recommendations Dysphagia 1 (Puree) solids;Nectar thick liquid Liquid Administration via Cup;Straw Medication Administration Crushed with puree Compensations Minimize environmental distractions;Slow rate;Small sips/bites Postural Changes Seated upright at 90 degrees   CHL IP OTHER RECOMMENDATIONS 09/23/2019 Recommended Consults -- Oral Care Recommendations Oral care BID Other Recommendations Order thickener from pharmacy   CHL IP FOLLOW UP RECOMMENDATIONS 09/23/2019 Follow up Recommendations Skilled Nursing  facility   CHL IP FREQUENCY AND DURATION 09/23/2019 Speech Therapy Frequency (ACUTE ONLY) min 2x/week Treatment Duration 2 weeks      CHL IP ORAL PHASE 09/23/2019 Oral Phase Impaired Oral - Pudding Teaspoon -- Oral - Pudding Cup -- Oral - Honey Teaspoon -- Oral - Honey Cup Holding of bolus;Reduced posterior propulsion;Lingual/palatal residue Oral - Nectar Teaspoon -- Oral - Nectar Cup -- Oral - Nectar Straw Delayed oral transit;Weak lingual manipulation;Lingual/palatal residue Oral - Thin Teaspoon Weak lingual manipulation;Delayed oral transit Oral - Thin Cup -- Oral - Thin Straw Premature spillage;Delayed oral transit;Weak lingual manipulation Oral - Puree Delayed oral transit;Weak lingual manipulation Oral - Mech Soft -- Oral - Regular -- Oral - Multi-Consistency -- Oral - Pill -- Oral Phase - Comment --  CHL IP PHARYNGEAL PHASE 09/23/2019 Pharyngeal Phase Impaired Pharyngeal- Pudding Teaspoon -- Pharyngeal -- Pharyngeal- Pudding Cup -- Pharyngeal -- Pharyngeal- Honey Teaspoon -- Pharyngeal -- Pharyngeal- Honey Cup -- Pharyngeal -- Pharyngeal- Nectar Teaspoon -- Pharyngeal -- Pharyngeal- Nectar Cup -- Pharyngeal -- Pharyngeal- Nectar Straw Delayed swallow  initiation-pyriform sinuses;Penetration/Aspiration before swallow;Reduced tongue base retraction;Reduced airway/laryngeal closure;Reduced anterior laryngeal mobility;Reduced laryngeal elevation;Pharyngeal residue - valleculae Pharyngeal Material enters airway, remains ABOVE vocal cords then ejected out Pharyngeal- Thin Teaspoon Delayed swallow initiation-vallecula;Reduced anterior laryngeal mobility;Reduced laryngeal elevation;Reduced tongue base retraction Pharyngeal -- Pharyngeal- Thin Cup -- Pharyngeal -- Pharyngeal- Thin Straw Penetration/Aspiration before swallow;Reduced laryngeal elevation;Reduced anterior laryngeal mobility;Reduced airway/laryngeal closure;Reduced tongue base retraction;Delayed swallow initiation-pyriform sinuses;Pharyngeal residue - valleculae Pharyngeal Material enters airway, CONTACTS cords and not ejected out Pharyngeal- Puree Delayed swallow initiation-vallecula;Reduced laryngeal elevation;Reduced anterior laryngeal mobility;Pharyngeal residue - valleculae Pharyngeal -- Pharyngeal- Mechanical Soft -- Pharyngeal -- Pharyngeal- Regular -- Pharyngeal -- Pharyngeal- Multi-consistency -- Pharyngeal -- Pharyngeal- Pill -- Pharyngeal -- Pharyngeal Comment --  CHL IP CERVICAL ESOPHAGEAL PHASE 09/23/2019 Cervical Esophageal Phase WFL Pudding Teaspoon -- Pudding Cup -- Honey Teaspoon -- Honey Cup -- Nectar Teaspoon -- Nectar Cup -- Nectar Straw -- Thin Teaspoon -- Thin Cup -- Thin Straw -- Puree -- Mechanical Soft -- Regular -- Multi-consistency -- Pill -- Cervical Esophageal Comment -- Elvia Collum [place] 09/23/2019, 3:24 PM               Scheduled Meds: .  stroke: mapping our early stages of recovery book   Does not apply Once  . aspirin  300 mg Rectal Daily  . atorvastatin  40 mg Oral q1800  . enoxaparin (LOVENOX) injection  40 mg Subcutaneous Q24H  . escitalopram  10 mg Oral Daily  . famotidine  20 mg Oral BID  . levothyroxine  37.5 mcg Oral Q0600  . mouth rinse  15 mL Mouth Rinse BID  .  senna-docusate  2 tablet Oral BID  . sodium chloride flush  3 mL Intravenous Once    Continuous Infusions: . dextrose 5% lactated ringers 50 mL/hr at 09/23/19 1533  . levETIRAcetam Stopped (09/24/19 0101)  . valproate sodium 250 mg (09/24/19 0100)     LOS: 3 days     Kayleen Memos, MD [facility] Pager [phone]  If 7PM-7AM, please contact night-coverage [email or web address] Password [facility] 09/24/2019, 9:41 AM

## 2019-09-24 NOTE — TOC Progression Note (Signed)
Transition of Care [facility]) - Progression Note    Patient Details  Name: Pamela Choi MRN: [id number] Date of Birth: 15-Oct-1930  [facility] CM/SW [place], [place] Phone Number: 09/24/2019, 10:24 AM  Clinical Narrative:   CSW following for discharge plan. CSW reached out to [facility] to make sure they were aware that the family would like to pursue some rehab if the insurance would cover. [facility] admissions indicated that, due to patient's insurance coverage, they would evaluate upon her return to SNF to see if she would need it; they would not need a prior authorization before she returned. [facility] will need a COVID test within 48 hours of her return, however. CSW to follow.    Expected Discharge Plan: Skilled Nursing Facility Barriers to Discharge: Ship broker, Continued Medical Work up  Expected Discharge Plan and Services Expected Discharge Plan: [place] In-house Referral: Clinical Social Work Discharge Planning Services: NA Post Acute Care Choice: [place] Living arrangements for the past 2 months: [place]: NA           Social Determinants of Health (SDOH) Interventions    Readmission Risk Interventions No flowsheet data found.

## 2019-09-25 LAB — NOVEL CORONAVIRUS, NAA (HOSP ORDER, SEND-OUT TO REF LAB; TAT 18-24 HRS): SARS-CoV-2, NAA: NOT DETECTED

## 2019-09-25 LAB — GLUCOSE, CAPILLARY: Glucose-Capillary: 85 mg/dL (ref 70–99)

## 2019-09-25 NOTE — Progress Notes (Signed)
PROGRESS NOTE  Pamela Choi [id number] DOB: January 06, 1930 DOA: 09/21/2019 PCP: Lucretia Kern, DO  HPI/Recap of past 24 hours: Pamela Choi is a 85 y.o. female with medical history significant of dementia, hypertension, hyperlipidemia, depression and anxiety who presented as a CODE STROKE with left-sided neglect, left-sided facial drooping and drooling.  Patient reportedly was found by nursing staff at 1700 today with slurred speech, drooping and left-sided weakness.  Patient has severe dementia and incoherent speech at baseline so son at bedside was able to provide some history.  He states that patient has been in the nursing home for 3 years ever since fall and has progressively had worsening memory issues.  She is mostly non-verbal and sometimes able to answer questions although speech is mostly incoherent.  She requires assistance with all activities of daily living at nursing facility including feeding.  Patient was able to follow some simple commands but incoherent when attempting to answer questions.  ED Course: She was afebrile and mildly hypertensive with a systolic [id number] over [id number].  CBC showed no leukocytosis or anemia.  BMP showed mildly elevated creatinine of 1.01 which is around her baseline.  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  She was evaluated by neurologist Dr. Cheral Marker and she was deemed not a candidate for TPA secondary to high risk of aneurysmal rupture.  Also not a candidate for thrombectomy due to pre-existing disability requiring 24-hour care.  09/25/19: Patient seen and examined at her bedside this morning.  Minimally interactive.  Noted at bedside RN's progress note on patient coughing with nectar thick liquids diet.  Continue aspiration precaution.  Hopeful for reevaluation by speech therapy.  Repeat COVID-19 testing in  process.   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 PT OT assessment recommending SNF CSW consulted to assist with placement Speech therapy recommended dysphagia 1 diet, pure with nectar thick liquids. Continue aspirin 325, Lipitor 80 mg daily LDL 110, goal less than 70 A1c is 5.3, goal less than 7.0 Twelve-lead EKG on admission sinus rhythm Seen by neurology, following 2D echo results done on 09/23/2019 showed normal LVEF 60 to 65% with no evidence of atrial septal defect or PFO. carotid Doppler ultrasound: Summary: Right Carotid: Unable to visualize ICA and ECA secondary to patient's refusal to                turn head.  Left Carotid: The extracranial vessels were near-normal with only minimal wall               thickening or plaque. Ongoing permissive hypertension; continue to hold off home antihypertensives, amlodipine 5 mg daily Continue neurochecks every 4 hours  Dysphagia likely secondary to acute ischemic CVA Evaluated by speech therapist with recommendation for dysphagia 1 diet, pure with nectar thick liquids Appeared to have trouble with nectar thick liquids, downgraded overnight to honey thick. Continue aspiration precautions  Hypertension Blood pressure is at goal Not on any antihypertensives. Continue to hold off amlodipine 5 milligrams daily  Late stage dementia Stable Reorient as necessary  Seizure disorder Continue Keppra to IV 250 mg twice daily, Depakote IV 250  mg twice daily  Hyperlipidemia LDL 116 on 09/22/2019 Goal LDL less than 70 Continue pravastatin   Hypothyroidism Continue IV levothyroxine 12.5 mcg daily  Chronic anxiety/depression Continue Lexapro when able to  swallow safely   Medication reconciliation Unclear why patient is on Coumadin Will call SNF facility  Disposition plan: Hopeful for reevaluation by speech therapy.  Likely DC to SNF tomorrow.   DVT prophylaxis:.Lovenox subcu daily. Code Status:DNR Family Communication: None at bedside.[phone] Also can reach his wife, Katharine Look, if he is unavailable [phone]   Consults called: Neurology, speech therapy      Objective: Vitals:   09/24/19 2019 09/25/19 0000 09/25/19 0339 09/25/19 0807  BP: (!) 155/95 140/78 134/76 (!) 146/75  Pulse: 78 68 68 70  Resp: 16 16 16 16   Temp: 98.4 F (36.9 C) 98.4 F (36.9 C) 98.4 F (36.9 C) 97.7 F (36.5 C)  TempSrc: Oral Oral Oral Oral  SpO2: 97% 100% 100% 99%  Weight:      Height:        Intake/Output Summary (Last 24 hours) at 09/25/2019 1104 Last data filed at 09/24/2019 1300 Gross per 24 hour  Intake 391.67 ml  Output -  Net 391.67 ml   Filed Weights   09/22/19 1306  Weight: 57.6 kg    Exam:  . General: 85 y.o. year-old female.  Minimally interactive in no acute distress.  .  Cardiovascular: Regular rate and rhythm no rubs or gallops no JVD or thyromegaly noted. Marland Kitchen Respiratory: Clear to auscultation no wheezes or rales.  Poor inspiratory effort.   . Abdomen: Soft nontender . Normal bowel sounds present.   . Musculoskeletal: No extremity edema.  Peripheral pulses in all 4 extremities.   Psychiatry: Mood is appropriate for condition and setting.  Data Reviewed: CBC: Recent Labs  Lab 09/21/19 1853 09/21/19 1857 09/22/19 0054  WBC 6.4  --  8.1  NEUTROABS 3.8  --   --   HGB 13.3 13.9 13.3  HCT 40.9 41.0 40.1  MCV 103.5*  --  102.3*  PLT 231  --  [id number]   Basic Metabolic Panel: Recent Labs  Lab 09/21/19 1853 09/21/19 1857 09/22/19 0054  NA 141 142  --   K 3.8 3.7  --   CL 107 107  --   CO2 25  --   --   GLUCOSE 108* 101*  --   BUN 24* 26*  --   CREATININE 1.01* 0.80 0.85  CALCIUM 9.4  --   --     GFR: Estimated Creatinine Clearance: 42 mL/min (by C-G formula based on SCr of 0.85 mg/dL). Liver Function Tests: Recent Labs  Lab 09/21/19 1853  AST 18  ALT 10  ALKPHOS 98  BILITOT 0.2*  PROT 7.1  ALBUMIN 3.5   No results for input(s): LIPASE, AMYLASE in the last 168 hours. No results for input(s): AMMONIA in the last 168 hours. Coagulation Profile: Recent Labs  Lab 09/21/19 1853  INR 1.0   Cardiac Enzymes: No results for input(s): CKTOTAL, CKMB, CKMBINDEX, TROPONINI in the last 168 hours. BNP (last 3 results) No results for input(s): PROBNP in the last 8760 hours. HbA1C: No results for input(s): HGBA1C in the last 72 hours. CBG: Recent Labs  Lab 09/21/19 1852 09/22/19 1718 09/22/19 2356  GLUCAP 101* 94 119*   Lipid Profile: No results for input(s): CHOL, HDL, LDLCALC, TRIG, CHOLHDL, LDLDIRECT in the last 72 hours. Thyroid Function Tests: No results for input(s): TSH, T4TOTAL, FREET4, T3FREE, THYROIDAB in the last  72 hours. Anemia Panel: No results for input(s): VITAMINB12, FOLATE, FERRITIN, TIBC, IRON, RETICCTPCT in the last 72 hours. Urine analysis:    Component Value Date/Time   COLORURINE YELLOW 03/04/2017 1645   APPEARANCEUR HAZY (A) 03/04/2017 1645   LABSPEC 1.028 03/04/2017 1645   PHURINE 5.0 03/04/2017 1645   GLUCOSEU NEGATIVE 03/04/2017 1645   HGBUR NEGATIVE 03/04/2017 1645   BILIRUBINUR NEGATIVE 03/04/2017 1645   KETONESUR NEGATIVE 03/04/2017 1645   PROTEINUR NEGATIVE 03/04/2017 1645   NITRITE NEGATIVE 03/04/2017 1645   LEUKOCYTESUR NEGATIVE 03/04/2017 1645   Sepsis Labs: @LABRCNTIP (procalcitonin:4,lacticidven:4)  ) Recent Results (from the past 240 hour(s))  SARS CORONAVIRUS 2 (TAT 6-24 HRS) Nasopharyngeal Nasopharyngeal Swab     Status: None   Collection Time: 09/21/19  9:50 PM   Specimen: Nasopharyngeal Swab  Result Value Ref Range Status   SARS Coronavirus 2 NEGATIVE NEGATIVE Final    Comment: (NOTE) SARS-CoV-2 target nucleic acids  are NOT DETECTED. The SARS-CoV-2 RNA is generally detectable in upper and lower respiratory specimens during the acute phase of infection. Negative results do not preclude SARS-CoV-2 infection, do not rule out co-infections with other pathogens, and should not be used as the sole basis for treatment or other patient management decisions. Negative results must be combined with clinical observations, patient history, and epidemiological information. The expected result is Negative. Fact Sheet for Patients: SugarRoll.be Fact Sheet for Healthcare Providers: https://www.woods-mathews.com/ This test is not yet approved or cleared by the [place] FDA and  has been authorized for detection and/or diagnosis of SARS-CoV-2 by FDA under an Emergency Use Authorization (EUA). This EUA will remain  in effect (meaning this test can be used) for the duration of the COVID-19 declaration under Section 56 4(b)(1) of the Act, 21 U.S.C. section 360bbb-3(b)(1), unless the authorization is terminated or revoked sooner. Performed at [facility], [place] [address] [place], [place] [address]   MRSA PCR Screening     Status: None   Collection Time: 09/22/19  1:06 PM   Specimen: Nasal Mucosa; Nasopharyngeal  Result Value Ref Range Status   MRSA by PCR NEGATIVE NEGATIVE Final    Comment:        The GeneXpert MRSA Assay (FDA approved for NASAL specimens only), is one component of a comprehensive MRSA colonization surveillance program. It is not intended to diagnose MRSA infection nor to guide or monitor treatment for MRSA infections. Performed at [facility], [place] [address] [place], [place] [address]       Studies: No results found.  Scheduled Meds: .  stroke: mapping our early stages of recovery book   Does not apply Once  . aspirin  300 mg Rectal Daily  . atorvastatin  40 mg Oral q1800  . enoxaparin (LOVENOX) injection  40 mg  Subcutaneous Q24H  . escitalopram  10 mg Oral Daily  . famotidine  20 mg Oral BID  . levothyroxine  37.5 mcg Oral Q0600  . mouth rinse  15 mL Mouth Rinse BID  . senna-docusate  2 tablet Oral BID  . sodium chloride flush  3 mL Intravenous Once    Continuous Infusions: . dextrose 5% lactated ringers 50 mL/hr at 09/24/19 1854  . levETIRAcetam 500 mg (09/25/19 0031)  . valproate sodium 250 mg (09/24/19 2128)     LOS: 4 days     Kayleen Memos, MD [facility] Pager [phone]  If 7PM-7AM, please contact night-coverage [email or web address] Password [email or web address] 09/25/2019, 11:04 AM

## 2019-09-25 NOTE — Progress Notes (Signed)
Physical Therapy Treatment Patient Details Name: Pamela Choi MRN: [id number] DOB: 07/28/1930 Today's Date: 09/25/2019    History of Present Illness 85 yo female presenting with left side facial droop, left neglect, and slurred speech. MRI showing acute ischemic nonhemorrhagic infarct involving right basal ganglia and right occipital cortex. PMH including dementia, hypertension, hyperlipidemia, depression and anxiety.    PT Comments    Patient received in bed, remains lethargic with eyes closed throughout session, only moaned and groaned with activity in bed possibly due to back pain. Noted she was totally soiled in urine and stool, performed rolling with totalAx1-2 and assist of nurse tech for pericare, patient moaning and groaning and limited by pain during session. She was left in bed with all needs met, nurse tech present and attending this afternoon, session otherwise limited by pain.     Follow Up Recommendations  SNF;Supervision/Assistance - 24 hour     Equipment Recommendations  None recommended by PT    Recommendations for Other Services       Precautions / Restrictions Precautions Precautions: Fall Restrictions Weight Bearing Restrictions: No    Mobility  Bed Mobility Overal bed mobility: Needs Assistance Bed Mobility: Rolling Rolling: Total assist         General bed mobility comments: totalA for rolling in bed, patient with eyes closed and moaning/groaning during session  Transfers                 General transfer comment: deferred  Ambulation/Gait             General Gait Details: deferred   Stairs             Wheelchair Mobility    Modified Rankin (Stroke Patients Only)       Balance Overall balance assessment: Needs assistance Sitting-balance support: No upper extremity supported;Feet supported Sitting balance-Leahy Scale: Zero                                      Cognition Arousal/Alertness:  Lethargic Behavior During Therapy: Flat affect Overall Cognitive Status: No family/caregiver present to determine baseline cognitive functioning                                 General Comments: h/o dementia      Exercises      General Comments        Pertinent Vitals/Pain Pain Assessment: Faces Faces Pain Scale: Hurts whole lot Pain Location: Back during activity Pain Descriptors / Indicators: Guarding;Grimacing;Discomfort Pain Intervention(s): Limited activity within patient's tolerance;Monitored during session    Home Living                      Prior Function            PT Goals (current goals can now be found in the care plan section) Acute Rehab PT Goals Patient Stated Goal: unable to state PT Goal Formulation: Patient unable to participate in goal setting Time For Goal Achievement: 10/07/19 Potential to Achieve Goals: Fair Progress towards PT goals: Not progressing toward goals - comment(limited by lethargy and pain)    Frequency    Min 2X/week      PT Plan Current plan remains appropriate    Co-evaluation              AM-PAC PT "6 Clicks"  Mobility   Outcome Measure  Help needed turning from your back to your side while in a flat bed without using bedrails?: Total Help needed moving from lying on your back to sitting on the side of a flat bed without using bedrails?: Total Help needed moving to and from a bed to a chair (including a wheelchair)?: Total Help needed standing up from a chair using your arms (e.g., wheelchair or bedside chair)?: Total Help needed to walk in hospital room?: Total Help needed climbing 3-5 steps with a railing? : Total 6 Click Score: 6    End of Session   Activity Tolerance: Patient limited by pain Patient left: in bed;with call bell/phone within reach;with nursing/sitter in room Nurse Communication: Mobility status PT Visit Diagnosis: Muscle weakness (generalized) (M62.81);Hemiplegia and  hemiparesis Hemiplegia - Right/Left: Left Hemiplegia - dominant/non-dominant: Non-dominant Hemiplegia - caused by: Cerebral infarction     Time: 1512-1530 PT Time Calculation (min) (ACUTE ONLY): 18 min  Charges:  $Therapeutic Activity: 8-22 mins                     Deniece Ree PT, DPT, CBIS  Supplemental Physical Therapist [place]    Pager [phone] Acute Rehab Office [phone]

## 2019-09-25 NOTE — Plan of Care (Signed)
  Problem: Nutrition: Goal: Risk of aspiration will decrease Outcome: Not Progressing   

## 2019-09-26 MED ORDER — STROKE: EARLY STAGES OF RECOVERY BOOK: 1.0000 | Freq: Once | 0 refills | Status: AC

## 2019-09-26 MED ORDER — ASPIRIN EC 81 MG PO TBEC: 81.0000 mg | DELAYED_RELEASE_TABLET | Freq: Every day | ORAL | 0 refills | Status: AC

## 2019-09-26 MED ORDER — AMLODIPINE BESYLATE 5 MG PO TABS
5.0000 mg | ORAL_TABLET | Freq: Every day | ORAL | Status: DC
  Administered 2019-09-26: 5 mg via ORAL
  Filled 2019-09-26: qty 1

## 2019-09-26 MED ORDER — LEVOTHYROXINE SODIUM 25 MCG PO TABS: 37.5000 ug | ORAL_TABLET | Freq: Every day | ORAL | 0 refills | Status: AC

## 2019-09-26 MED ORDER — LEVETIRACETAM 250 MG PO TABS: 125.0000 mg | ORAL_TABLET | Freq: Two times a day (BID) | ORAL | 0 refills | Status: AC

## 2019-09-26 MED ORDER — ESCITALOPRAM OXALATE 5 MG PO TABS: 10.0000 mg | ORAL_TABLET | Freq: Every day | ORAL | 0 refills | Status: AC

## 2019-09-26 MED ORDER — RESOURCE THICKENUP CLEAR PO POWD: 5.0000 | ORAL | 0 refills | Status: AC | PRN

## 2019-09-26 MED ORDER — ATORVASTATIN CALCIUM 40 MG PO TABS: 40.0000 mg | ORAL_TABLET | Freq: Every day | ORAL | 0 refills | Status: AC

## 2019-09-26 NOTE — TOC Transition Note (Signed)
Transition of Care [facility]) - CM/SW Discharge Note   Patient Details  Name: Pamela Choi MRN: [id number] Date of Birth: June 16, 1930  [facility] CM/SW Contact:  Weston Anna, LCSW Phone Number: 09/26/2019, 11:37 AM   Clinical Narrative:     Patient is set to discharge to [facility] today. Patient is able to discharge to facility once DC summary/ orders are placed- MD aware. CSW notified daughter-in-law, Katharine Look, of discharge. Patient will need transportation via PTAR. Please call report to [phone]  Final next level of care: Skilled Nursing Facility Barriers to Discharge: No Barriers Identified   Patient Goals and CMS Choice Patient states their goals for this hospitalization and ongoing recovery are:: Pt son would like her to return to [facility] and receive some therapy CMS Medicare.gov Compare Post Acute Care list provided to:: Patient Represenative (must comment) Choice offered to / list presented to : [facility] / [place]  Discharge Placement              Patient chooses bed at: [place], [place] Patient to be transferred to facility by: [facility] Name of family member notified: Katharine Look Patient and family notified of of transfer: 09/26/19  Discharge Plan and Services In-house Referral: Clinical Social Work Discharge Planning Services: NA Post Acute Care Choice: [place]          DME Arranged: N/A         HH Arranged: NA          Social Determinants of Health (SDOH) Interventions     Readmission Risk Interventions No flowsheet data found.

## 2019-09-26 NOTE — Progress Notes (Signed)
Report received from [name], [name] on 3W. Family at bedside. Patient transported to 5c17 to await PTAR for transport.

## 2019-09-26 NOTE — Discharge Summary (Signed)
Discharge Summary  Pamela Choi BBC:[id number] DOB: May 31, 1930  PCP: Lucretia Kern, DO  Admit date: 09/21/2019 Discharge date: 09/26/2019  Time spent: 35 minutes   Recommendations for Outpatient Follow-up:  1. Follow up with neurology 2. Follow up with your PCP; followed by NP Garrison Columbus at [facility]. 3. Continue physical therapy 4. Take your medications as prescribed 5. Fall precautions 6. Aspiration precautions  Discharge Diagnoses:  Active Hospital Problems   Diagnosis Date Noted   Hemineglect 09/21/2019    Resolved Hospital Problems  No resolved problems to display.    Discharge Condition: Stable  Diet recommendation:   Recommendations  Diet recommendations: Nectar-thick liquid;Dysphagia 1 (puree) Liquids provided via: Cup;Straw Medication Administration: Crushed with puree Supervision: Staff to assist with self feeding;Full supervision/cueing for compensatory strategies Compensations: Minimize environmental distractions;Slow rate;Small sips/bites Postural Changes and/or Swallow Maneuvers: Seated upright 90 degrees.        Vitals:   09/26/19 0742 09/26/19 1122  BP: (!) 160/84 (!) 155/86  Pulse: 67 75  Resp: 18 18  Temp: 97.9 F (36.6 C) 98 F (36.7 C)  SpO2: 100% 96%    History of present illness:   Pamela A Manigaultis a 85 y.o.femalewith medical history significant ofdementia with behavioral disturbances previously on Depakote a year ago, PE in 2018 previously on coumadin for 3-6 months in 2018, hypertension, hyperlipidemia, chronic depression and anxiety who presentedas a CODE STROKEwith left-sided neglect, left-sided facial drooping and drooling. Patient reportedly was found by nursing staff at 1700 on day of presentation with slurred speech, drooping and left-sided weakness. Patient has severe dementia and incoherent speech at baseline so son at bedside was able to provide some history. He states that patient has been in the nursing home for  3 years ever since fall and has progressively had worsening memory issues. She is mostly non-verbal, and sometimes able to answer questions although speech is mostly incoherent. She requires assistance with all activities of daily living at nursing facility including feeding. Patient was able to follow some simple commands but incoherent when attempting to answer questions.  ED Course:She was afebrile and mildly hypertensive with a systolic 503U over 882. CBC showed no leukocytosis or anemia. BMP showed mildly elevated creatinine of 1.01 which is around her baseline. 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 She was evaluated by neurologist Dr. Cheral Marker and she was deemed not a candidate for TPA secondary to high risk of aneurysmal rupture. Also not a candidate for thrombectomy due to pre-existing disability requiring 24-hour care.   MRI head - 18 mm acute ischemic nonhemorrhagic perforator type infarct involving the right basal ganglia. Additional 8 mm acute ischemic nonhemorrhagic right occipital cortical infarct.   MRA head - Fusiform aneurysmal dilatation of the right ICA terminus, stable.   Carotid Doppler - normal except not able to visualize right ICA and ECA secondary to patient's refusal to turn head.   2D Echo - No shunt, no thrombus, nml EF.  PT OT assessment recommended SNF 24-hour supervision and assistance.  Speech recommended nectar thick liquid, dysphagia 1 pured diet as stated above.  Repeated COVID-19 test negative.  09/26/19: Patient was seen and examined at her bedside this morning.  No acute events overnight.  She is more alert today and interactive.  No new complaints.  Able to squeeze from her right hand.  Left upper extremity 0 out  of 5 strength.  Able to lift left lower extremity with strength 3 out of 5.  Hospital  Course:  Active Problems:   Hemineglect  Acute ischemic nonhemorrhagic infarct involving the right basal ganglia with left hemiplegia Per MRI Presented as a code stroke; TPA not given due to a large calcified right MCA fusiform aneurysm noted precluding TPA administration due to high risk of aneurysmal rupture. PT OT assessment recommending SNF with 24-hour supervision and assistance CSW consulted to assist with placement Speech therapy recommended dysphagia 1 diet, pure with nectar thick liquids. Continue aspirin 81 mg daily and Lipitor 40 mg daily at discharge as recommended by neurology LDL 110, goal less than 70 A1c is 5.3, goal less than 7.0 Twelve-lead EKG on admission sinus rhythm Seen by neurology, following 2D echo results done on 09/23/2019 showed normal LVEF 60 to 65% with no evidence of atrial septal defect or PFO. carotid Doppler ultrasound: Summary: Right Carotid: Unable to visualize ICA and ECA secondary to patient's refusal to turn head.  Left Carotid: The extracranial vessels were near-normal with only minimal wall thickening or plaque. Resume amlodipine 5 mg daily Follow-up with neurology outpatient  Dysphagia likely secondary to acute ischemic CVA Evaluated by speech therapist with recommendation for dysphagia 1 diet, pure with nectar thick liquids Continue aspiration precautions  Hypertension Resume Norvasc 5 mg daily Follow-up with your PCP  Late stage dementia with behavioral disturbance Discussed with NP Garrison Columbus via phone on 09/26/19, patient was on Depakote which was used for behavior disturbance; stopped due to increased somnolence as requested by family. Reorient as needed  Possible seizure disorder Discussed with NP Garrison Columbus at [facility] who stated the patient had one episode of convulsion about a year ago and was started on Keppra with no recurrence Resume home dose of Keppra Follow-up with neurology  outpatient  Hyperlipidemia LDL 116 on 09/22/2019 Goal LDL less than 70 Continue Lipitor 40 mg daily as recommended by neurology  Hypothyroidism Resume home dose of levothyroxine  Chronic anxiety/depression Resume home medications   GERD Continue GI medications  Medication reconciliation Patient completed course of Coumadin in 2018 after being diagnosed with pulmonary embolism, took it for 3 to 6 months with end date in 2018.  Was taken off Coumadin due to high risk for fall.  Per NP Truman Hayward, patient was falling all the time.    Code Status:DNR   Consults called:Neurology, speech therapy       Discharge Exam: BP (!) 155/86 (BP Location: Left Arm)    Pulse 75    Temp 98 F (36.7 C) (Axillary)    Resp 18    Ht 5' 5" (1.651 m)    Wt 57.6 kg    SpO2 96%    BMI 21.13 kg/m   General: 85 y.o. year-old female frail-appearing in no acute distress.  Alert and more interactive today in the setting of advanced dementia.  Cardiovascular: Regular rate and rhythm with no rubs or gallops.  No thyromegaly or JVD noted.    Respiratory: Clear to auscultation with no wheezes or rales.  Poor inspiratory effort.  Abdomen: Soft nontender nondistended with normal bowel sounds x4 quadrants. Musculoskeletal: No lower extremity edema. 2/4 pulses in all 4 extremities. Able to squeeze from her right hand.  Left upper extremity 0 out of 5 strength.  Able to lift left lower extremity with strength 3 out of 5.    Psychiatry: Mood is appropriate for condition and setting  Discharge Instructions You were cared for  by a hospitalist during your hospital stay. If you have any questions about your discharge medications or the care you received while you were in the hospital after you are discharged, you can call the unit and asked to speak with the hospitalist on call if the hospitalist that took care of you is not available. Once you are discharged, your primary care physician will handle any  further medical issues. Please note that NO REFILLS for any discharge medications will be authorized once you are discharged, as it is imperative that you return to your primary care physician (or establish a relationship with a primary care physician if you do not have one) for your aftercare needs so that they can reassess your need for medications and monitor your lab values.   Allergies as of 09/26/2019      Reactions   Bee Venom Anaphylaxis      Medication List    STOP taking these medications   acetaminophen 325 MG tablet Commonly known as: TYLENOL   cetirizine 10 MG tablet Commonly known as: ZYRTEC   divalproex 125 MG capsule Commonly known as: DEPAKOTE SPRINKLE   donepezil 10 MG tablet Commonly known as: ARICEPT   LORazepam 0.5 MG tablet Commonly known as: ATIVAN   Melatonin 1 MG Tabs   oxyCODONE 5 MG immediate release tablet Commonly known as: Oxy IR/ROXICODONE   pravastatin 40 MG tablet Commonly known as: PRAVACHOL   warfarin 2 MG tablet Commonly known as: COUMADIN     TAKE these medications    stroke: mapping our early stages of recovery book Misc 1 each by Does not apply route once for 1 dose.   amLODipine 2.5 MG tablet Commonly known as: NORVASC Take 5 mg by mouth daily. HOLD IF SYSTOLIC NUMBER IS <007   aspirin EC 81 MG tablet Take 1 tablet (81 mg total) by mouth daily.   atorvastatin 40 MG tablet Commonly known as: LIPITOR Take 1 tablet (40 mg total) by mouth daily at 6 PM.   barrier cream Crea Commonly known as: non-specified Apply 1 application topically 3 (three) times daily.   bisacodyl 10 MG suppository Commonly known as: DULCOLAX Place 10 mg rectally every evening.   escitalopram 5 MG tablet Commonly known as: LEXAPRO Take 2 tablets (10 mg total) by mouth daily.   famotidine 20 MG tablet Commonly known as: PEPCID Take 20 mg by mouth 2 (two) times daily.   feeding supplement (BOOST BREEZE) Liqd Take 237 mLs by mouth 2 (two)  times daily.   lactulose 10 GM/15ML solution Commonly known as: CHRONULAC Take 30 g by mouth daily.   levETIRAcetam 250 MG tablet Commonly known as: KEPPRA Take 0.5 tablets (125 mg total) by mouth 2 (two) times daily.   levothyroxine 25 MCG tablet Commonly known as: SYNTHROID Take 1.5 tablets (37.5 mcg total) by mouth daily before breakfast.   loratadine 10 MG tablet Commonly known as: CLARITIN Take 10 mg by mouth daily.   metoCLOPramide 5 MG tablet Commonly known as: REGLAN Take 2.5 mg by mouth 3 (three) times daily with meals.   montelukast 10 MG tablet Commonly known as: SINGULAIR Take 10 mg by mouth at bedtime.   NON FORMULARY See admin instructions. Magic Cup: Drink 1 cup by mouth three times a day   NON FORMULARY Take 120 mLs by mouth See admin instructions. MedPass: Drink 120 ml's by mouth three times a day   [employer] Calcium/D3 500-400 MG-UNIT tablet Generic drug: calcium-vitamin D Take 1 tablet by  mouth daily.   pantoprazole 40 MG tablet Commonly known as: PROTONIX Take 40 mg by mouth daily before breakfast.   polyethylene glycol 17 g packet Commonly known as: MIRALAX / GLYCOLAX Take 17 g by mouth 2 (two) times daily. MIXED INTO 8 OUNCES OF FLUID   Resource ThickenUp Clear Powd Take 600 g by mouth as needed.   sennosides-docusate sodium 8.6-50 MG tablet Commonly known as: SENOKOT-S Take 2 tablets by mouth 2 (two) times daily.   Vitamin D-3 25 MCG (1000 UT) Caps Take 3,000 Units by mouth daily.      Allergies  Allergen Reactions   Bee Venom Anaphylaxis   Follow-up Information    Lucretia Kern, DO. Call in 1 day(s).   Specialty: Family Medicine Why: Please call for a post hospital follow-up appointment. Contact information: [place] [address] [phone]        [facility]. Call in 1 day(s).   Why: Please call for a post hospital follow-up appointment. Contact information: [address]     [place] [address] [phone]           The results of significant diagnostics from this hospitalization (including imaging, microbiology, ancillary and laboratory) are listed below for reference.    Significant Diagnostic Studies: Dg Skull 1-3 Views  Result Date: 09/22/2019 CLINICAL DATA:  Pre MRI clearance EXAM: SKULL - 1-3 VIEW COMPARISON:  CT head same day FINDINGS: Tiny metallic density projects over the posterior midline scalp, external to the patient on same day CT of the head. No other metallic foreign body is identified. There is no evidence of skull fracture or other focal bone lesions. The maxillary dentition is absent. Multiple mandibular teeth are also absent with mild mandibular prognathism. Mild degenerative cervical spondylitic changes are seen. IMPRESSION: Tiny linear metallic density projects over the posterior midline scalp, external to the patient on cross-sectional imaging., likely hair-tie, correlate with visual inspection and remove prior to MR scanning. No other metallic foreign body. Electronically Signed   By: Lovena Le M.D.   On: 09/22/2019 01:29   Dg Chest 1 View  Result Date: 09/22/2019 CLINICAL DATA:  Initial evaluation for MRI clearance, stroke. EXAM: CHEST  1 VIEW COMPARISON:  Prior radiograph from 03/04/2017. FINDINGS: Cardiac and mediastinal silhouettes are stable, and remain within normal limits. Lungs hypoinflated with elevation of the right hemidiaphragm. Mild bibasilar atelectasis. No focal infiltrate, edema, or effusion. No pneumothorax. No acute osseous finding. No radiopaque foreign body or metallic implant. Multiple scattered prominent gas-filled loops of bowel noted within the visualized upper abdomen. IMPRESSION: 1. No radiopaque foreign body or metallic implant. 2. Low lung volumes with associated mild bibasilar atelectasis. No other active cardiopulmonary disease. Electronically Signed   By: Jeannine Boga M.D.   On: 09/22/2019 02:22   Dg Abdomen 1 View  Result Date: 09/22/2019 CLINICAL DATA:  Pre MRI EXAM: ABDOMEN - 1 VIEW COMPARISON:  None. FINDINGS: No metallic foreign body or hardware is evident. There is diffuse gaseous distention of the bowel particularly the colon. Large volume of fecal material projects over the rectal vault. Phleboliths are present in the pelvis. Osseous structures are unremarkable aside from degenerative changes in the lower lumbar spine and hips. Cardiac monitor leads overlie the chest. IMPRESSION: 1. No metallic foreign body or hardware is evident. 2. Diffuse gaseous distention of the bowel particularly the colon, consistent with ileus. Electronically Signed   By: Lovena Le M.D.   On:  09/22/2019 01:26   Mr Angio Head Wo Contrast  Result Date: 09/22/2019 CLINICAL DATA:  Initial evaluation for acute stroke. EXAM: MRI HEAD WITHOUT CONTRAST MRA HEAD WITHOUT CONTRAST TECHNIQUE: Multiplanar, multiecho pulse sequences of the brain and surrounding structures were obtained without intravenous contrast. Angiographic images of the head were obtained using MRA technique without contrast. COMPARISON:  Prior CT from 09/21/2019. FINDINGS: MRI HEAD FINDINGS Brain: Advanced age-related cerebral atrophy, most notable at the anterior temporal lobes bilaterally. Patchy and confluent T2/FLAIR hyperintensity within the periventricular deep white matter both cerebral hemispheres most consistent with chronic small vessel ischemic disease, also fairly advanced. Multiple remote lacunar infarcts noted within the bilateral thalami and pons. Multiple scatter remote bilateral cerebellar infarcts. 18 mm acute ischemic perforator type infarct seen extending from the right lentiform nucleus to the right caudate. Additional 8 mm linear acute ischemic infarct noted involving the cortical gray matter of the right occipital lobe (series 13, image 66). No associated hemorrhage or mass effect. No other  evidence for acute or subacute ischemia. Gray-white matter differentiation otherwise maintained. No evidence for acute or chronic intracranial hemorrhage. No mass lesion, midline shift or mass effect. No hydrocephalus. No extra-axial fluid collection. Incidental note made of an empty sella. Vascular: Major intracranial vascular flow voids maintained at the skull base. Skull and upper cervical spine: Craniocervical junction within normal limits. Bone marrow signal intensity normal. No scalp soft tissue abnormality. Sinuses/Orbits: Patient status post ocular lens replacement on the right. Mild scattered mucosal thickening noted within the ethmoidal air cells and maxillary sinuses. No mastoid effusion. Other: None. MRA HEAD FINDINGS ANTERIOR CIRCULATION: Visualized distal cervical segments of the internal carotid arteries are patent with antegrade flow. Both internal carotid arteries patent to the termini without flow-limiting stenosis. ICAs are both somewhat ectatic through the siphon region. 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 A1 segments patent bilaterally. Negative anterior communicating artery complex. Anterior cerebral arteries patent to their distal aspects without stenosis. M1 segments remain patent without significant stenosis. Normal MCA bifurcations. Distal MCA branches well perfused and symmetric. POSTERIOR CIRCULATION: Right vertebral artery slightly dominant and tortuous, crossing the midline prior to the vertebrobasilar junction. Left vertebral artery slightly diminutive. Vertebral arteries remain widely patent to the vertebrobasilar junction. PICA origins not well seen. Basilar diminutive but remains patent to its distal aspect without flow-limiting stenosis. Superior cerebral arteries patent bilaterally. Fetal type origin of the right PCA. Left PCA supplied via a hypoplastic left P1 as well as a robust left posterior communicating artery. PCAs  perfused to their distal aspects without stenosis. Diffuse small vessel atheromatous irregularity noted throughout the intracranial circulation. IMPRESSION: MRI HEAD IMPRESSION: 1. 18 mm acute ischemic nonhemorrhagic perforator type infarct involving the right basal ganglia. 2. Additional 8 mm acute ischemic nonhemorrhagic right occipital cortical infarct. 3. Underlying advanced age-related cerebral atrophy with chronic small vessel ischemic disease in extensive chronic ischemic changes as above. MRA HEAD IMPRESSION: 1. Stable intracranial MRA as compared to 2018. 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 2. Diffuse small vessel atheromatous irregularity throughout the intracranial circulation. No proximal high-grade or correctable stenosis. Electronically Signed   By: Jeannine Boga M.D.   On: 09/22/2019 03:30   Mr Brain Wo Contrast  Result Date: 09/22/2019 CLINICAL DATA:  Initial evaluation for acute stroke. EXAM: MRI HEAD WITHOUT CONTRAST MRA HEAD WITHOUT CONTRAST TECHNIQUE: Multiplanar, multiecho pulse sequences of the brain and surrounding structures were obtained without intravenous contrast. Angiographic images  of the head were obtained using MRA technique without contrast. COMPARISON:  Prior CT from 09/21/2019. FINDINGS: MRI HEAD FINDINGS Brain: Advanced age-related cerebral atrophy, most notable at the anterior temporal lobes bilaterally. Patchy and confluent T2/FLAIR hyperintensity within the periventricular deep white matter both cerebral hemispheres most consistent with chronic small vessel ischemic disease, also fairly advanced. Multiple remote lacunar infarcts noted within the bilateral thalami and pons. Multiple scatter remote bilateral cerebellar infarcts. 18 mm acute ischemic perforator type infarct seen extending from the right lentiform nucleus to the right caudate. Additional 8 mm linear acute ischemic infarct noted involving the cortical gray matter of the right  occipital lobe (series 13, image 66). No associated hemorrhage or mass effect. No other evidence for acute or subacute ischemia. Gray-white matter differentiation otherwise maintained. No evidence for acute or chronic intracranial hemorrhage. No mass lesion, midline shift or mass effect. No hydrocephalus. No extra-axial fluid collection. Incidental note made of an empty sella. Vascular: Major intracranial vascular flow voids maintained at the skull base. Skull and upper cervical spine: Craniocervical junction within normal limits. Bone marrow signal intensity normal. No scalp soft tissue abnormality. Sinuses/Orbits: Patient status post ocular lens replacement on the right. Mild scattered mucosal thickening noted within the ethmoidal air cells and maxillary sinuses. No mastoid effusion. Other: None. MRA HEAD FINDINGS ANTERIOR CIRCULATION: Visualized distal cervical segments of the internal carotid arteries are patent with antegrade flow. Both internal carotid arteries patent to the termini without flow-limiting stenosis. ICAs are both somewhat ectatic through the siphon region. 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 A1 segments patent bilaterally. Negative anterior communicating artery complex. Anterior cerebral arteries patent to their distal aspects without stenosis. M1 segments remain patent without significant stenosis. Normal MCA bifurcations. Distal MCA branches well perfused and symmetric. POSTERIOR CIRCULATION: Right vertebral artery slightly dominant and tortuous, crossing the midline prior to the vertebrobasilar junction. Left vertebral artery slightly diminutive. Vertebral arteries remain widely patent to the vertebrobasilar junction. PICA origins not well seen. Basilar diminutive but remains patent to its distal aspect without flow-limiting stenosis. Superior cerebral arteries patent bilaterally. Fetal type origin of the right PCA. Left PCA supplied  via a hypoplastic left P1 as well as a robust left posterior communicating artery. PCAs perfused to their distal aspects without stenosis. Diffuse small vessel atheromatous irregularity noted throughout the intracranial circulation. IMPRESSION: MRI HEAD IMPRESSION: 1. 18 mm acute ischemic nonhemorrhagic perforator type infarct involving the right basal ganglia. 2. Additional 8 mm acute ischemic nonhemorrhagic right occipital cortical infarct. 3. Underlying advanced age-related cerebral atrophy with chronic small vessel ischemic disease in extensive chronic ischemic changes as above. MRA HEAD IMPRESSION: 1. Stable intracranial MRA as compared to 2018. 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 2. Diffuse small vessel atheromatous irregularity throughout the intracranial circulation. No proximal high-grade or correctable stenosis. Electronically Signed   By: Jeannine Boga M.D.   On: 09/22/2019 03:30   Dg Swallowing Func-speech Pathology  Result Date: 09/23/2019 Objective Swallowing Evaluation: Type of Study: MBS-Modified Barium Swallow Study  Patient Details Name: JANAIYAH BLACKARD MRN: [id number] Date of Birth: Nov 13, 1930 Today's Date: 09/23/2019 Time: SLP Start Time (ACUTE ONLY): 1408 -SLP Stop Time (ACUTE ONLY): 1435 SLP Time Calculation (min) (ACUTE ONLY): 27 min Past Medical History: Past Medical History: Diagnosis Date  Acute pericarditis 07/20/2015  Allergy   ANKLE SPRAIN, RIGHT 05/10/2007  Qualifier: Diagnosis of  By: Norma Fredrickson MD, Larena Glassman    Anxiety and depression  CARCINOMA, INFILTRATING DUCTAL, RIGHT BREAST 11/27/2003  Annotation: grade2, ER/PR and Her2-/neu positive status post exemestane x5 years, completed March of 2010 Qualifier: History of  By: Norma Fredrickson MD, Larena Glassman     Chicken pox   Hyperlipidemia   Hypertension   Mild dementia ([place])   eval with neurology in 2014, mod SVD, declined tx  Osteopenia   Pericardial effusion   a. 06/2015 Echo: EF 55%, mild LVH, mild AI,  mild dil of Asc Ao (63m, 373m@ root), nl RV fxn, mild TR, small to mod circumferential pericardial effusion.  Pericarditis   a. 06/2015  Plantar wart of right foot   TOBACCO ABUSE, HX OF 11/08/2006  Qualifier: Diagnosis of  By: SiLinden DolinD, AaMarjory Lies Past Surgical History: Past Surgical History: Procedure Laterality Date  BREAST SURGERY    BUNIONECTOMY    R foot  HAMMER TOE SURGERY    Right foot HPI: Patient is an [age].,o. female with PMH: severe dementia, HTN, HLD, depression, anxiety, who presented as code stroke with left sided neglect, facial drooping and drooling. She has been living in a SNF for the past 3 years and per chart review, son had reported her speech was incoherent at baseline and memory issues have been worsening. Premorbidly, she requires assistance with all ADL's including feeding. MRI revealed 18 mm acute ischemic nonhemmorhagic perforator type infarct involving right basal ganglia and [id number] ischemic nonhemorrhagic right occiptal cortical infarct, advanced age related cerebral atrophy with chronic SVD.t  Subjective: Pt was encountered leaning foward with head in natural chin tuck.  Assessment / Plan / Recommendation CHL IP CLINICAL IMPRESSIONS 09/23/2019 Clinical Impression Angeleena A. Fissel is an 89 51ar old female who presents with mild-moderate oropharyngeal dysphagia with resultant silent, deep laryngeal penetration to the level of the vocal cords and possible aspiration with thin liquid,  and trace, transient laryngeal penetration of nectar-thick liquid.  Pt was very lethargic and visibly in pain throughout this study.  She was unable to rest her back against the chair, and required continuous physical assistance to position and maintain her head in a semi-neutral position.  Pt consumed trials of thin liquid (tsp/straw), nectar-thick liquid (straw), and puree.  Attempted honey-thick liquid, however, pt was unable to draw the liquid from a straw and she had poor bolus acceptance  during assisted cup sip.  No laryngeal penetration or aspiration was observed with thin liquid via tsp or puree.  Oral phase was remarkable for prolonged AP transport, reduced lingual strength resulting in oral residue and reduced lingual control resulting in premature spillage to the valleculae and pyriform sinuses.  Pharyngeal phase was remarkable for reduced laryngeal closure resulting in penetration/aspiration before the swallow.  Additionally, pt presented with decreased BOT retraction and reduced hyolaryngeal elevation/excursion resulting in vallecular residue.  Recommend Dysphagia 1 (puree) solids and nectar-thick liquids with the following precautions: 1) Small bites/sips 2) Slow rate of intake 3) Sit upright as possible 4) Only feed when awake/alert.  Pt will benefit from 1:1 assistance and/or full supervision with po intake to monitor and encourage use of compensatory strategies.  SLP Visit Diagnosis Dysphagia, oropharyngeal phase (R13.12) Attention and concentration deficit following -- Frontal lobe and executive function deficit following -- Impact on safety and function Moderate aspiration risk   CHL IP TREATMENT RECOMMENDATION 09/23/2019 Treatment Recommendations Therapy as outlined in treatment plan below   Prognosis 09/23/2019 Prognosis for Safe Diet Advancement Fair Barriers to Reach Goals Cognitive deficits;Severity of deficits Barriers/Prognosis Comment -- CHL IP DIET RECOMMENDATION  09/23/2019 SLP Diet Recommendations Dysphagia 1 (Puree) solids;Nectar thick liquid Liquid Administration via Cup;Straw Medication Administration Crushed with puree Compensations Minimize environmental distractions;Slow rate;Small sips/bites Postural Changes Seated upright at 90 degrees   CHL IP OTHER RECOMMENDATIONS 09/23/2019 Recommended Consults -- Oral Care Recommendations Oral care BID Other Recommendations Order thickener from pharmacy   CHL IP FOLLOW UP RECOMMENDATIONS 09/23/2019 Follow up Recommendations Skilled  Nursing facility   [facility] IP FREQUENCY AND DURATION 09/23/2019 Speech Therapy Frequency (ACUTE ONLY) min 2x/week Treatment Duration 2 weeks      CHL IP ORAL PHASE 09/23/2019 Oral Phase Impaired Oral - Pudding Teaspoon -- Oral - Pudding Cup -- Oral - Honey Teaspoon -- Oral - Honey Cup Holding of bolus;Reduced posterior propulsion;Lingual/palatal residue Oral - Nectar Teaspoon -- Oral - Nectar Cup -- Oral - Nectar Straw Delayed oral transit;Weak lingual manipulation;Lingual/palatal residue Oral - Thin Teaspoon Weak lingual manipulation;Delayed oral transit Oral - Thin Cup -- Oral - Thin Straw Premature spillage;Delayed oral transit;Weak lingual manipulation Oral - Puree Delayed oral transit;Weak lingual manipulation Oral - Mech Soft -- Oral - Regular -- Oral - Multi-Consistency -- Oral - Pill -- Oral Phase - Comment --  CHL IP PHARYNGEAL PHASE 09/23/2019 Pharyngeal Phase Impaired Pharyngeal- Pudding Teaspoon -- Pharyngeal -- Pharyngeal- Pudding Cup -- Pharyngeal -- Pharyngeal- Honey Teaspoon -- Pharyngeal -- Pharyngeal- Honey Cup -- Pharyngeal -- Pharyngeal- Nectar Teaspoon -- Pharyngeal -- Pharyngeal- Nectar Cup -- Pharyngeal -- Pharyngeal- Nectar Straw Delayed swallow initiation-pyriform sinuses;Penetration/Aspiration before swallow;Reduced tongue base retraction;Reduced airway/laryngeal closure;Reduced anterior laryngeal mobility;Reduced laryngeal elevation;Pharyngeal residue - valleculae Pharyngeal Material enters airway, remains ABOVE vocal cords then ejected out Pharyngeal- Thin Teaspoon Delayed swallow initiation-vallecula;Reduced anterior laryngeal mobility;Reduced laryngeal elevation;Reduced tongue base retraction Pharyngeal -- Pharyngeal- Thin Cup -- Pharyngeal -- Pharyngeal- Thin Straw Penetration/Aspiration before swallow;Reduced laryngeal elevation;Reduced anterior laryngeal mobility;Reduced airway/laryngeal closure;Reduced tongue base retraction;Delayed swallow initiation-pyriform sinuses;Pharyngeal residue -  valleculae Pharyngeal Material enters airway, CONTACTS cords and not ejected out Pharyngeal- Puree Delayed swallow initiation-vallecula;Reduced laryngeal elevation;Reduced anterior laryngeal mobility;Pharyngeal residue - valleculae Pharyngeal -- Pharyngeal- Mechanical Soft -- Pharyngeal -- Pharyngeal- Regular -- Pharyngeal -- Pharyngeal- Multi-consistency -- Pharyngeal -- Pharyngeal- Pill -- Pharyngeal -- Pharyngeal Comment --  CHL IP CERVICAL ESOPHAGEAL PHASE 09/23/2019 Cervical Esophageal Phase WFL Pudding Teaspoon -- Pudding Cup -- Honey Teaspoon -- Honey Cup -- Nectar Teaspoon -- Nectar Cup -- Nectar Straw -- Thin Teaspoon -- Thin Cup -- Thin Straw -- Puree -- Mechanical Soft -- Regular -- Multi-consistency -- Pill -- Cervical Esophageal Comment -- Elvia Collum [place] 09/23/2019, 3:24 PM              Ct Head Code Stroke Wo Contrast  Result Date: 09/21/2019 CLINICAL DATA:  Code stroke.  Left-sided weakness.  Slurred speech. EXAM: CT HEAD WITHOUT CONTRAST TECHNIQUE: Contiguous axial images were obtained from the base of the skull through the vertex without intravenous contrast. COMPARISON:  05/06/2019 FINDINGS: Brain: Chronic ischemic changes are seen affecting the pons. 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 Cerebral hemispheres elsewhere show old small vessel infarctions of the basal ganglia and chronic confluent small-vessel change of the white matter. No acute cortical or large vessel territory infarction is visible. No mass, hemorrhage, hydrocephalus or extra-axial collection. Vascular: There is atherosclerotic calcification of the major vessels at the base of the brain. 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 Skull: Negative Sinuses/Orbits: Clear/normal Other: None ASPECTS ([place] Stroke Program Early CT Score) -  Ganglionic level infarction (caudate, lentiform nuclei, internal capsule, insula, M1-M3 cortex): 7 - Supraganglionic infarction (M4-M6 cortex): 3 Total score (0-10 with 10 being normal): 10 IMPRESSION: 1. Extensive chronic ischemic changes throughout the brain as outlined above. No sign of acute infarction by CT. 2. 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 3. ASPECTS is 10. 4. The case was discussed with Dr. Cheral Marker at 1910 hours while the examination was still preliminary. Electronically Signed   By: Nelson Chimes M.D.   On: 09/21/2019 19:21   Vas US Carotid (at [place] Only)  Result Date: 09/24/2019 Carotid Arterial Duplex Study Indications:       CVA and Speech disturbance. Risk Factors:      Hypertension, hyperlipidemia. Limitations        Today's exam was limited due to the patient's inability or                    unwillingness to cooperate. Comparison Study:  No prior study on file for comparison. Performing Technologist: Sharion Dove RVS  Examination Guidelines: A complete evaluation includes B-mode imaging, spectral Doppler, color Doppler, and power Doppler as needed of all accessible portions of each vessel. Bilateral testing is considered an integral part of a complete examination. Limited examinations for reoccurring indications may be performed as noted.  Right Carotid Findings: +----------+--------+--------+--------+------------------+--------------+             PSV cm/s EDV cm/s Stenosis Plaque Description Comments        +----------+--------+--------+--------+------------------+--------------+  CCA Prox   62       11                homogeneous                        +----------+--------+--------+--------+------------------+--------------+  CCA Distal 50       10                homogeneous                         +----------+--------+--------+--------+------------------+--------------+  ICA Prox                                                 Not visualized  +----------+--------+--------+--------+------------------+--------------+  ICA Distal                                               Not visualized  +----------+--------+--------+--------+------------------+--------------+  ECA                                                      Not visualized  +----------+--------+--------+--------+------------------+--------------+ +----------+--------+-------+--------+-------------------+             PSV cm/s EDV cms Describe Arm Pressure (mmHG)  +----------+--------+-------+--------+-------------------+  Subclavian 44                                             +----------+--------+-------+--------+-------------------+ +---------+--------+--+--------+-+  Vertebral PSV cm/s 23 EDV cm/s 4  +---------+--------+--+--------+-+  Left Carotid Findings: +----------+--------+--------+--------+------------------+--------+             PSV cm/s EDV cm/s Stenosis Plaque Description Comments  +----------+--------+--------+--------+------------------+--------+  CCA Prox   89       15                homogeneous                  +----------+--------+--------+--------+------------------+--------+  CCA Distal 66       12                homogeneous                  +----------+--------+--------+--------+------------------+--------+  ICA Prox   54       20                                             +----------+--------+--------+--------+------------------+--------+  ICA Distal 32       9                                              +----------+--------+--------+--------+------------------+--------+  ECA        58       7                                              +----------+--------+--------+--------+------------------+--------+ +----------+--------+--------+--------+-------------------+             PSV cm/s EDV cm/s Describe Arm Pressure (mmHG)   +----------+--------+--------+--------+-------------------+  Subclavian 85                                              +----------+--------+--------+--------+-------------------+ +---------+--------+--+--------+--+  Vertebral PSV cm/s 42 EDV cm/s 14  +---------+--------+--+--------+--+  Summary: Right Carotid: Unable to visualize ICA and ECA secondary to patient's refusal to                turn head. Left Carotid: The extracranial vessels were near-normal with only minimal wall               thickening or plaque. Vertebrals:  Bilateral vertebral arteries demonstrate antegrade flow. Subclavians: Normal flow hemodynamics were seen in bilateral subclavian              arteries. *See table(s) above for measurements and observations.  Electronically signed by Antony Contras MD on 09/24/2019 at 2:18:13 PM.    Final     Microbiology: Recent Results (from the past 240 hour(s))  SARS CORONAVIRUS 2 (TAT 6-24 HRS) Nasopharyngeal Nasopharyngeal Swab     Status: None   Collection Time: 09/21/19  9:50 PM   Specimen: Nasopharyngeal Swab  Result Value Ref Range Status   SARS Coronavirus 2 NEGATIVE NEGATIVE Final    Comment: (NOTE) SARS-CoV-2 target nucleic acids are NOT DETECTED. The SARS-CoV-2 RNA is generally detectable in upper and lower respiratory specimens during the acute phase of infection. Negative results do not preclude SARS-CoV-2 infection, do not rule out  co-infections with other pathogens, and should not be used as the sole basis for treatment or other patient management decisions. Negative results must be combined with clinical observations, patient history, and epidemiological information. The expected result is Negative. Fact Sheet for Patients: SugarRoll.be Fact Sheet for Healthcare Providers: https://www.woods-mathews.com/ This test is not yet approved or cleared by the [place] FDA and  has been authorized for detection and/or diagnosis of  SARS-CoV-2 by FDA under an Emergency Use Authorization (EUA). This EUA will remain  in effect (meaning this test can be used) for the duration of the COVID-19 declaration under Section 56 4(b)(1) of the Act, 21 U.S.C. section 360bbb-3(b)(1), unless the authorization is terminated or revoked sooner. Performed at [facility], [place] [address], [place], [place] [address]   MRSA PCR Screening     Status: None   Collection Time: 09/22/19  1:06 PM   Specimen: Nasal Mucosa; Nasopharyngeal  Result Value Ref Range Status   MRSA by PCR NEGATIVE NEGATIVE Final    Comment:        The GeneXpert MRSA Assay (FDA approved for NASAL specimens only), is one component of a comprehensive MRSA colonization surveillance program. It is not intended to diagnose MRSA infection nor to guide or monitor treatment for MRSA infections. Performed at [facility], [place] [address] [place], [place] [address]   Novel Coronavirus, NAA (hospital order; send-out to ref lab)     Status: None   Collection Time: 09/24/19  2:40 PM   Specimen: Nasopharyngeal Swab; Respiratory  Result Value Ref Range Status   SARS-CoV-2, NAA NOT DETECTED NOT DETECTED Final    Comment: (NOTE) This nucleic acid amplification test was developed and its performance characteristics determined by [employer]. Nucleic acid amplification tests include PCR and TMA. This test has not been FDA cleared or approved. This test has been authorized by FDA under an Emergency Use Authorization (EUA). This test is only authorized for the duration of time the declaration that circumstances exist justifying the authorization of the emergency use of in vitro diagnostic tests for detection of SARS-CoV-2 virus and/or diagnosis of COVID-19 infection under section 564(b)(1) of the Act, 21 U.S.C. 947SJG-2(E) (1), unless the authorization is terminated or revoked sooner. When diagnostic testing is negative, the possibility of a  false negative result should be considered in the context of a patient's recent exposures and the presence of clinical signs and symptoms consistent with COVID-19. An individual without symptoms of COVID- 19 and who is not shedding SARS-CoV-2 vi rus would expect to have a negative (not detected) result in this assay. Performed At: [facility] [address] [place], [place] [id number] Rush Farmer MD YY:[id number]    [place]  Final    Comment: Performed at [facility], [place] [address] [place], [place] [address]     Labs: Basic Metabolic Panel: Recent Labs  Lab 09/21/19 1853 09/21/19 1857 09/22/19 0054  NA 141 142  --   K 3.8 3.7  --   CL 107 107  --   CO2 25  --   --   GLUCOSE 108* 101*  --   BUN 24* 26*  --   CREATININE 1.01* 0.80 0.85  CALCIUM 9.4  --   --    Liver Function Tests: Recent Labs  Lab 09/21/19 1853  AST 18  ALT 10  ALKPHOS 98  BILITOT 0.2*  PROT 7.1  ALBUMIN 3.5   No results for input(s): LIPASE, AMYLASE  in the last 168 hours. No results for input(s): AMMONIA in the last 168 hours. CBC: Recent Labs  Lab 09/21/19 1853 09/21/19 1857 09/22/19 0054  WBC 6.4  --  8.1  NEUTROABS 3.8  --   --   HGB 13.3 13.9 13.3  HCT 40.9 41.0 40.1  MCV 103.5*  --  102.3*  PLT 231  --  217   Cardiac Enzymes: No results for input(s): CKTOTAL, CKMB, CKMBINDEX, TROPONINI in the last 168 hours. BNP: BNP (last 3 results) No results for input(s): BNP in the last 8760 hours.  ProBNP (last 3 results) No results for input(s): PROBNP in the last 8760 hours.  CBG: Recent Labs  Lab 09/21/19 1852 09/22/19 1718 09/22/19 2356 09/25/19 1141  GLUCAP 101* 94 119* 85       Signed:  Kayleen Memos, MD [facility] 09/26/2019, 11:57 AM

## 2019-09-26 NOTE — Discharge Instructions (Signed)
Ischemic Stroke  An ischemic stroke is the sudden death of brain tissue. Blood carries oxygen to all areas of the body. This type of stroke happens when your blood does not flow to your brain like normal. Your brain cannot get the oxygen it needs. This is an emergency. It must be treated right away. Symptoms of a stroke usually happen all of a sudden. You may notice them when you wake up. They can include:  Weakness or loss of feeling in your face, arm, or leg. This often happens on one side of the body.  Trouble walking.  Trouble moving your arms or legs.  Loss of balance or coordination.  Feeling confused.  Trouble talking or understanding what people are saying.  Slurred speech.  Trouble seeing.  Seeing two of one object (double vision).  Feeling dizzy.  Feeling sick to your stomach (nauseous) and throwing up (vomiting).  A very bad headache for no reason. Get help as soon as any of these problems start. This is important. Some treatments work better if they are given right away. These include:  Aspirin.  Medicines to control blood pressure.  A shot (injection) of medicine to break up the blood clot.  Treatments given in the blood vessel (artery) to take out the clot or break it up. Other treatments may include:  Oxygen.  Fluids given through an IV tube.  Medicines to thin out your blood.  Procedures to help your blood flow better. What increases the risk? Certain things may make you more likely to have a stroke. Some of these are things that you can change, such as:  Being very overweight (obesity).  Smoking.  Taking birth control pills.  Not being active.  Drinking too much alcohol.  Using drugs. Other risk factors include:  High blood pressure.  High cholesterol.  Diabetes.  Heart disease.  Being [place] American, Native American, Hispanic, or [place] Native.  Being over age 70.  Family history of stroke.  Having had blood clots,  stroke, or warning stroke (transient ischemic attack, TIA) in the past.  Sickle cell disease.  Being a woman with a history of high blood pressure in pregnancy (preeclampsia).  Migraine headache.  Sleep apnea.  Having an irregular heartbeat (atrial fibrillation).  Long-term (chronic) diseases that cause soreness and swelling (inflammation).  Disorders that affect how your blood clots. Follow these instructions at home: Medicines  Take over-the-counter and prescription medicines only as told by your doctor.  If you were told to take aspirin or another medicine to thin your blood, take it exactly as told by your doctor. ? Taking too much of the medicine can cause bleeding. ? If you do not take enough, it may not work as well.  Know the side effects of your medicines. If you are taking a blood thinner, make sure you: ? Hold pressure over any cuts for longer than usual. ? Tell your dentist and other doctors that you take this medicine. ? Avoid activities that may cause damage or injury to your body. Eating and drinking  Follow instructions from your doctor about what you cannot eat or drink.  Eat healthy foods.  If you have trouble with swallowing, do these things to avoid choking: ? Take small bites when eating. ? Eat foods that are soft or pureed. Safety  Follow instructions from your health care team about physical activity.  Use a walker or cane as told by your doctor.  Keep your home safe so you do not fall.  This may include: ? Having experts look at your home to make sure it is safe. ? Putting grab bars in the bedroom and bathroom. ? Using raised toilets. ? Putting a seat in the shower. General instructions  Do not use any tobacco products. ? Examples of these are cigarettes, chewing tobacco, and e-cigarettes. ? If you need help quitting, ask your doctor.  Limit how much alcohol you drink. This means no more than 1 drink a day for nonpregnant women and 2 drinks  a day for men. One drink equals 12 oz of beer, 5 oz of wine, or 1 oz of hard liquor.  If you need help to stop using drugs or alcohol, ask your doctor to refer you to a program or specialist.  Stay active. Exercise as told by your doctor.  Keep all follow-up visits as told by your doctor. This is important. Get help right away if:   You have any signs of a stroke. "BE FAST" is an easy way to remember the main warning signs: ? B - Balance. Signs are dizziness, sudden trouble walking, or loss of balance. ? E - Eyes. Signs are trouble seeing or a change in how you see. ? F - Face. Signs are sudden weakness or loss of feeling of the face, or the face or eyelid drooping on one side. ? A - Arms. Signs are weakness or loss of feeling in an arm. This happens suddenly and usually on one side of the body. ? S - Speech. Signs are sudden trouble speaking, slurred speech, or trouble understanding what people say. ? T - Time. Time to call emergency services. Write down what time symptoms started.  You have other signs of a stroke, such as: ? A sudden, very bad headache with no known cause. ? Feeling sick to your stomach (nausea). ? Throwing up (vomiting). ? Jerky movements you cannot control (seizure). These symptoms may be an emergency. Do not wait to see if the symptoms will go away. Get medical help right away. Call your local emergency services (911 in the [place] Do not drive yourself to the hospital. Summary  An ischemic stroke is the sudden death of brain tissue.  Symptoms of a stroke usually happen all of a sudden. You may notice them when you wake up.  Get help if you have any warning signs of a stroke. This is important. Some treatments work better if they are given right away. This information is not intended to replace advice given to you by your health care provider. Make sure you discuss any questions you have with your health care provider. Document Released: 12/02/2011 Document  Revised: 05/24/2018 Document Reviewed: 03/10/2016 Elsevier Patient Education  [place].   Physical Therapy After a Stroke After a stroke, some people experience physical changes or problems. Physical therapy may be prescribed to help you recover and overcome problems such as:  Inability to move (paralysis) or weakness, typically affecting one side of the body.  Trouble with balance.  Pain, a pins and needles sensation, or numbness in certain parts of the body. You may also have difficulty feeling touch, pressure, or changes in temperature.  Involuntary muscle tightening (spasticity).  Stiffness in muscles and joints.  Altered coordination and reflexes. What causes physical disability after a stroke? A stroke can damage parts of your brain that control your body's normal functions, including your ability to move and to keep your balance. The types of physical problems you have will depend on how  severe the stroke was and where it was located in the brain. Weakness or paralysis may affect just your fingers and hands, a whole leg or arm, or an entire side of your body. What is physical therapy? Physical therapy involves using exercises, stretches, and activities to help you regain movement and independence after your stroke. Physical therapy may focus on one or more of the following:  Range of motion. This can help with movement and reduce muscle stiffness.  Balance. This helps to lower your risk of falling.  Position changes or transfers, such as moving from sitting to standing or from a chair to a bed.  Coordination, such as getting an object from a shelf.  Muscle strength. Muscles may be strengthened with weights or by repeating certain motions.  Functional mobility. This may include stair training or learning how to use a wheelchair, walker, or cane.  Walking (gait training).  Activities of daily living, such as getting out of the car or buttoning a shirt. Why is  physical therapy important? It is important to do exercises and follow your rehabilitation plan as told by your physical therapist. Physical therapy can:  Help you regain independence.  Prevent injury from falls by building strength and balance.  Lower your risk of blood clots.  Lower your risk of skin sores (pressure injuries).  Increase physical activity and exercise. This may help lower your risk for another stroke.  Help reduce pain. When will therapy start and where will I have therapy? Your health care provider will decide when it is best for you to start therapy. In some cases, people start rehabilitation, including physical therapy, as soon as they are medically stable, which may be 24-48 hours after a stroke. Rehabilitation can take place in a few different places, based on your needs. It may take place in:  The hospital or an in-patient rehabilitation hospital.  An outpatient rehabilitation facility.  A long-term care facility.  A community rehabilitation clinic.  Your home. What are assistive devices? Assistive devices are tools to help you move, maintain balance, and manage daily tasks while recovering from a stroke. Your physical therapist may recommend and help you learn to use:  Equipment to help you move, such as wheelchairs, canes, or walkers.  Braces or splints to keep your arms, hands, legs, or feet in a comfortable and safe position.  Bathtub benches or grab bars to keep you safe in the bathroom.  Special utensils, bowls, and plates that allow you to eat with one hand. It is important to use these devices as told by your health care provider. Summary  After a stroke, some people may experience physical disabilities, such as weakness or paralysis, pain, or balance problems.  Physical therapy involves exercises, stretches, and activities that help to improve your ability to move and to handle daily tasks.  Physical therapy exercises focus on restoring  range of motion, balance, coordination, muscle strength, and the ability to move (mobility).  Physical therapy can help you regain independence, prevent falls, and allow you to live a more active lifestyle after a stroke. This information is not intended to replace advice given to you by your health care provider. Make sure you discuss any questions you have with your health care provider. Document Released: 03/21/2017 Document Revised: 04/05/2019 Document Reviewed: 03/21/2017 Elsevier Patient Education  2020 [employer].

## 2019-09-26 NOTE — NC FL2 (Signed)
[place] LEVEL OF CARE SCREENING TOOL     IDENTIFICATION  Patient Name: Pamela Choi Birthdate: 14-Sep-1930 Sex: female Admission Date (Current Location): 09/21/2019  [place] and [place] Number:  Pamela Argue [id number] [place] and Address:  [facility], [place] [address] [place], [place] [address]      Provider Number: [id number]  Attending Physician Name and Address:  Kayleen Memos, DO  Relative Name and Phone Number:  [place], [place], [phone]    Current Level of Care: Hospital Recommended Level of Care: [place] Prior Approval Number:    Date Approved/Denied:   PASRR Number: [id number]  Discharge Plan: SNF    Current Diagnoses: Patient Active Problem List   Diagnosis Date Noted  . Hemineglect 09/21/2019  . Syncope and collapse 02/28/2017  . Recurrent major depressive disorder, in full remission ([place]) 12/30/2016  . Memory loss 02/22/2013  . Hyperlipemia 01/11/2007  . Essential hypertension 11/08/2006    Orientation RESPIRATION BLADDER Height & Weight     Self  Normal Incontinent Weight: 126 lb 15.8 oz (57.6 kg) Height:  5\' 5"  (165.1 cm)  BEHAVIORAL SYMPTOMS/MOOD NEUROLOGICAL BOWEL NUTRITION STATUS      Incontinent Diet(see dc summary)  AMBULATORY STATUS COMMUNICATION OF NEEDS Skin   Extensive Assist Verbally Normal                       Personal Care Assistance Level of Assistance  Bathing, Feeding, Dressing Bathing Assistance: Maximum assistance Feeding assistance: Limited assistance Dressing Assistance: Maximum assistance Total Care Assistance: Maximum assistance   Functional Limitations Info  Sight, Hearing, Speech Sight Info: Impaired(Wears glasses) Hearing Info: Adequate Speech Info: Adequate    SPECIAL CARE FACTORS FREQUENCY  PT (By licensed PT), OT (By licensed OT)     PT Frequency: 5 OT Frequency: 5     Speech Therapy Frequency: 2x/wk      Contractures  Contractures Info: Not present    Additional Factors Info  Code Status, Allergies Code Status Info: DNR Allergies Info: Bee Venom           Current Medications (09/26/2019):  This is the current hospital active medication list Current Facility-Administered Medications  Medication Dose Route Frequency Provider Last Rate Last Dose  .  stroke: mapping our early stages of recovery book   Does not apply Once Tu, Ching T, DO      . acetaminophen (TYLENOL) tablet 650 mg  650 mg Oral Q4H PRN Tu, Ching T, DO   650 mg at 09/24/19 2126   Or  . acetaminophen (TYLENOL) solution 650 mg  650 mg Per Tube Q4H PRN Tu, Ching T, DO       Or  . acetaminophen (TYLENOL) suppository 650 mg  650 mg Rectal Q4H PRN Tu, Ching T, DO   650 mg at 09/23/19 1142  . amLODipine (NORVASC) tablet 5 mg  5 mg Oral Daily Irene Pap N, DO      . aspirin suppository 300 mg  300 mg Rectal Daily Rinehuls, David L, PA-C   300 mg at 09/26/19 1040  . atorvastatin (LIPITOR) tablet 40 mg  40 mg Oral q1800 Rinehuls, David L, PA-C   40 mg at 09/25/19 1749  . dextrose 5 % in lactated ringers infusion   Intravenous Continuous Kayleen Memos, DO 50 mL/hr at 09/26/19 0018    . enoxaparin (LOVENOX) injection 40 mg  40 mg Subcutaneous Q24H Tu, Ching T, DO   40 mg  at 09/25/19 2247  . escitalopram (LEXAPRO) tablet 10 mg  10 mg Oral Daily Tu, Ching T, DO   10 mg at 09/26/19 1033  . famotidine (PEPCID) tablet 20 mg  20 mg Oral BID Tu, Ching T, DO   20 mg at 09/26/19 1040  . labetalol (NORMODYNE) injection 5 mg  5 mg Intravenous Q2H PRN Tu, Ching T, DO   5 mg at 09/22/19 0448  . levETIRAcetam (KEPPRA) IVPB 500 mg/100 mL premix  500 mg Intravenous Q12H Irene Pap N, DO 400 mL/hr at 09/26/19 1202 500 mg at 09/26/19 1202  . levothyroxine (SYNTHROID) tablet 37.5 mcg  37.5 mcg Oral Q0600 Kayleen Memos, DO   37.5 mcg at 09/26/19 [id number]  . MEDLINE mouth rinse  15 mL Mouth Rinse BID Irene Pap N, DO   15 mL at 09/26/19 1041  . Resource ThickenUp  Clear   Oral PRN Kayleen Memos, DO      . senna-docusate (Senokot-S) tablet 1 tablet  1 tablet Oral QHS PRN Tu, Ching T, DO      . senna-docusate (Senokot-S) tablet 2 tablet  2 tablet Oral BID Tu, Ching T, DO   2 tablet at 09/26/19 1040  . sodium chloride flush (NS) 0.9 % injection 3 mL  3 mL Intravenous Once Tu, Ching T, DO         Discharge Medications: Please see discharge summary for a list of discharge medications.  Relevant Imaging Results:  Relevant Lab Results:   Additional Information [id number]  Weston Anna, LCSW

## 2019-09-26 NOTE — Plan of Care (Signed)
  Problem: Nutrition: Goal: Risk of aspiration will decrease Outcome: Not Progressing   

## 2019-10-11 ENCOUNTER — Encounter: Payer: Self-pay | Admitting: Family Medicine

## 2019-10-17 ENCOUNTER — Telehealth: Payer: Self-pay | Admitting: Family Medicine

## 2019-10-17 NOTE — Telephone Encounter (Signed)
Call pt left a message

## 2021-01-27 DEATH — deceased
